# Patient Record
Sex: Male | Born: 1955 | Race: White | Hispanic: No | Marital: Married | Smoking: Never smoker
Health system: Southern US, Community
[De-identification: ages and names within clinical notes are randomized; demographics above are authoritative.]

## PROBLEM LIST (undated history)

## (undated) DIAGNOSIS — R7983 Abnormal findings of blood amino-acid level: Secondary | ICD-10-CM

## (undated) DIAGNOSIS — E538 Deficiency of other specified B group vitamins: Secondary | ICD-10-CM

## (undated) DIAGNOSIS — T7840XA Allergy, unspecified, initial encounter: Secondary | ICD-10-CM

## (undated) DIAGNOSIS — Z9989 Dependence on other enabling machines and devices: Secondary | ICD-10-CM

## (undated) DIAGNOSIS — K2270 Barrett's esophagus without dysplasia: Secondary | ICD-10-CM

## (undated) DIAGNOSIS — G4730 Sleep apnea, unspecified: Secondary | ICD-10-CM

## (undated) DIAGNOSIS — I10 Essential (primary) hypertension: Secondary | ICD-10-CM

## (undated) DIAGNOSIS — M7660 Achilles tendinitis, unspecified leg: Secondary | ICD-10-CM

## (undated) DIAGNOSIS — E119 Type 2 diabetes mellitus without complications: Secondary | ICD-10-CM

## (undated) DIAGNOSIS — R7401 Elevation of levels of liver transaminase levels: Secondary | ICD-10-CM

## (undated) DIAGNOSIS — K5730 Diverticulosis of large intestine without perforation or abscess without bleeding: Secondary | ICD-10-CM

## (undated) DIAGNOSIS — G4733 Obstructive sleep apnea (adult) (pediatric): Secondary | ICD-10-CM

## (undated) DIAGNOSIS — R7402 Elevation of levels of lactic acid dehydrogenase (LDH): Secondary | ICD-10-CM

## (undated) DIAGNOSIS — K449 Diaphragmatic hernia without obstruction or gangrene: Secondary | ICD-10-CM

## (undated) DIAGNOSIS — K760 Fatty (change of) liver, not elsewhere classified: Secondary | ICD-10-CM

## (undated) DIAGNOSIS — K219 Gastro-esophageal reflux disease without esophagitis: Secondary | ICD-10-CM

## (undated) DIAGNOSIS — M1990 Unspecified osteoarthritis, unspecified site: Secondary | ICD-10-CM

## (undated) DIAGNOSIS — R740 Nonspecific elevation of levels of transaminase and lactic acid dehydrogenase [LDH]: Secondary | ICD-10-CM

## (undated) DIAGNOSIS — E785 Hyperlipidemia, unspecified: Secondary | ICD-10-CM

## (undated) DIAGNOSIS — E7211 Homocystinuria: Secondary | ICD-10-CM

## (undated) HISTORY — DX: Type 2 diabetes mellitus without complications: E11.9

## (undated) HISTORY — DX: Essential (primary) hypertension: I10

## (undated) HISTORY — DX: Diaphragmatic hernia without obstruction or gangrene: K44.9

## (undated) HISTORY — PX: WISDOM TOOTH EXTRACTION: SHX21

## (undated) HISTORY — DX: Dependence on other enabling machines and devices: Z99.89

## (undated) HISTORY — DX: Gilbert syndrome: E80.4

## (undated) HISTORY — DX: Sleep apnea, unspecified: G47.30

## (undated) HISTORY — DX: Abnormal findings of blood amino-acid level: R79.83

## (undated) HISTORY — DX: Hyperlipidemia, unspecified: E78.5

## (undated) HISTORY — DX: Gastro-esophageal reflux disease without esophagitis: K21.9

## (undated) HISTORY — DX: Allergy, unspecified, initial encounter: T78.40XA

## (undated) HISTORY — DX: Elevation of levels of liver transaminase levels: R74.01

## (undated) HISTORY — DX: Barrett's esophagus without dysplasia: K22.70

## (undated) HISTORY — DX: Homocystinuria: E72.11

## (undated) HISTORY — DX: Unspecified osteoarthritis, unspecified site: M19.90

## (undated) HISTORY — PX: KNEE ARTHROSCOPY: SHX127

## (undated) HISTORY — PX: VASECTOMY: SHX75

## (undated) HISTORY — DX: Deficiency of other specified B group vitamins: E53.8

## (undated) HISTORY — DX: Elevation of levels of lactic acid dehydrogenase (LDH): R74.02

## (undated) HISTORY — DX: Fatty (change of) liver, not elsewhere classified: K76.0

## (undated) HISTORY — DX: Nonspecific elevation of levels of transaminase and lactic acid dehydrogenase (ldh): R74.0

## (undated) HISTORY — DX: Obstructive sleep apnea (adult) (pediatric): G47.33

## (undated) HISTORY — DX: Achilles tendinitis, unspecified leg: M76.60

## (undated) HISTORY — DX: Diverticulosis of large intestine without perforation or abscess without bleeding: K57.30

## (undated) HISTORY — PX: UPPER GASTROINTESTINAL ENDOSCOPY: SHX188

---

## 1999-09-07 ENCOUNTER — Encounter (INDEPENDENT_AMBULATORY_CARE_PROVIDER_SITE_OTHER): Payer: Self-pay | Admitting: Specialist

## 1999-09-07 ENCOUNTER — Other Ambulatory Visit: Admission: RE | Admit: 1999-09-07 | Discharge: 1999-09-07 | Payer: Self-pay | Admitting: Gastroenterology

## 2001-06-30 ENCOUNTER — Other Ambulatory Visit: Admission: RE | Admit: 2001-06-30 | Discharge: 2001-06-30 | Payer: Self-pay | Admitting: Gastroenterology

## 2001-06-30 ENCOUNTER — Encounter (INDEPENDENT_AMBULATORY_CARE_PROVIDER_SITE_OTHER): Payer: Self-pay | Admitting: Specialist

## 2004-04-27 ENCOUNTER — Encounter: Payer: Self-pay | Admitting: Internal Medicine

## 2004-10-23 ENCOUNTER — Ambulatory Visit: Payer: Self-pay | Admitting: Internal Medicine

## 2005-02-12 ENCOUNTER — Ambulatory Visit: Payer: Self-pay | Admitting: Internal Medicine

## 2005-06-26 ENCOUNTER — Ambulatory Visit: Payer: Self-pay | Admitting: Internal Medicine

## 2005-07-30 ENCOUNTER — Ambulatory Visit: Payer: Self-pay | Admitting: Gastroenterology

## 2005-08-06 ENCOUNTER — Ambulatory Visit: Payer: Self-pay | Admitting: Gastroenterology

## 2005-08-15 ENCOUNTER — Ambulatory Visit: Payer: Self-pay | Admitting: Internal Medicine

## 2005-08-21 ENCOUNTER — Ambulatory Visit: Payer: Self-pay | Admitting: Gastroenterology

## 2005-09-26 ENCOUNTER — Ambulatory Visit: Payer: Self-pay | Admitting: Gastroenterology

## 2005-10-31 ENCOUNTER — Ambulatory Visit: Payer: Self-pay | Admitting: Family Medicine

## 2005-11-01 ENCOUNTER — Ambulatory Visit: Payer: Self-pay | Admitting: Gastroenterology

## 2005-11-01 ENCOUNTER — Encounter (INDEPENDENT_AMBULATORY_CARE_PROVIDER_SITE_OTHER): Payer: Self-pay | Admitting: Specialist

## 2006-04-01 ENCOUNTER — Ambulatory Visit: Payer: Self-pay | Admitting: Internal Medicine

## 2006-07-08 ENCOUNTER — Encounter (INDEPENDENT_AMBULATORY_CARE_PROVIDER_SITE_OTHER): Payer: Self-pay | Admitting: *Deleted

## 2006-07-08 ENCOUNTER — Ambulatory Visit: Payer: Self-pay | Admitting: Internal Medicine

## 2006-07-08 LAB — CONVERTED CEMR LAB: PSA: 0.83 ng/mL

## 2006-08-06 ENCOUNTER — Ambulatory Visit: Payer: Self-pay | Admitting: Gastroenterology

## 2007-02-23 ENCOUNTER — Ambulatory Visit: Payer: Self-pay | Admitting: Internal Medicine

## 2007-08-06 ENCOUNTER — Ambulatory Visit: Payer: Self-pay | Admitting: Gastroenterology

## 2007-09-15 DIAGNOSIS — K449 Diaphragmatic hernia without obstruction or gangrene: Secondary | ICD-10-CM | POA: Insufficient documentation

## 2007-09-15 DIAGNOSIS — E7219 Other disorders of sulfur-bearing amino-acid metabolism: Secondary | ICD-10-CM

## 2007-09-15 DIAGNOSIS — K2270 Barrett's esophagus without dysplasia: Secondary | ICD-10-CM | POA: Insufficient documentation

## 2007-09-15 DIAGNOSIS — D179 Benign lipomatous neoplasm, unspecified: Secondary | ICD-10-CM | POA: Insufficient documentation

## 2007-09-15 DIAGNOSIS — R7983 Abnormal findings of blood amino-acid level: Secondary | ICD-10-CM | POA: Insufficient documentation

## 2007-09-15 DIAGNOSIS — K219 Gastro-esophageal reflux disease without esophagitis: Secondary | ICD-10-CM | POA: Insufficient documentation

## 2007-09-17 ENCOUNTER — Ambulatory Visit: Payer: Self-pay | Admitting: Internal Medicine

## 2007-09-17 DIAGNOSIS — I10 Essential (primary) hypertension: Secondary | ICD-10-CM | POA: Insufficient documentation

## 2007-09-21 ENCOUNTER — Encounter (INDEPENDENT_AMBULATORY_CARE_PROVIDER_SITE_OTHER): Payer: Self-pay | Admitting: *Deleted

## 2007-10-02 ENCOUNTER — Ambulatory Visit: Payer: Self-pay | Admitting: Internal Medicine

## 2007-10-05 ENCOUNTER — Encounter (INDEPENDENT_AMBULATORY_CARE_PROVIDER_SITE_OTHER): Payer: Self-pay | Admitting: *Deleted

## 2007-10-06 ENCOUNTER — Telehealth (INDEPENDENT_AMBULATORY_CARE_PROVIDER_SITE_OTHER): Payer: Self-pay | Admitting: *Deleted

## 2008-01-18 ENCOUNTER — Telehealth (INDEPENDENT_AMBULATORY_CARE_PROVIDER_SITE_OTHER): Payer: Self-pay | Admitting: *Deleted

## 2008-02-08 ENCOUNTER — Telehealth (INDEPENDENT_AMBULATORY_CARE_PROVIDER_SITE_OTHER): Payer: Self-pay | Admitting: *Deleted

## 2008-07-19 ENCOUNTER — Telehealth (INDEPENDENT_AMBULATORY_CARE_PROVIDER_SITE_OTHER): Payer: Self-pay | Admitting: *Deleted

## 2008-08-16 ENCOUNTER — Ambulatory Visit: Payer: Self-pay | Admitting: Gastroenterology

## 2008-10-28 DIAGNOSIS — K5730 Diverticulosis of large intestine without perforation or abscess without bleeding: Secondary | ICD-10-CM

## 2008-10-28 HISTORY — DX: Diverticulosis of large intestine without perforation or abscess without bleeding: K57.30

## 2008-10-28 HISTORY — PX: COLONOSCOPY: SHX174

## 2008-11-16 ENCOUNTER — Ambulatory Visit: Payer: Self-pay | Admitting: Internal Medicine

## 2008-11-16 DIAGNOSIS — J309 Allergic rhinitis, unspecified: Secondary | ICD-10-CM | POA: Insufficient documentation

## 2008-11-16 DIAGNOSIS — R351 Nocturia: Secondary | ICD-10-CM

## 2008-11-16 DIAGNOSIS — N401 Enlarged prostate with lower urinary tract symptoms: Secondary | ICD-10-CM | POA: Insufficient documentation

## 2008-11-16 DIAGNOSIS — M7660 Achilles tendinitis, unspecified leg: Secondary | ICD-10-CM | POA: Insufficient documentation

## 2008-11-23 ENCOUNTER — Telehealth (INDEPENDENT_AMBULATORY_CARE_PROVIDER_SITE_OTHER): Payer: Self-pay | Admitting: *Deleted

## 2008-11-24 ENCOUNTER — Encounter (INDEPENDENT_AMBULATORY_CARE_PROVIDER_SITE_OTHER): Payer: Self-pay | Admitting: *Deleted

## 2009-01-06 ENCOUNTER — Telehealth: Payer: Self-pay | Admitting: Gastroenterology

## 2009-01-24 ENCOUNTER — Telehealth (INDEPENDENT_AMBULATORY_CARE_PROVIDER_SITE_OTHER): Payer: Self-pay | Admitting: *Deleted

## 2009-01-24 ENCOUNTER — Ambulatory Visit: Payer: Self-pay | Admitting: Gastroenterology

## 2009-02-02 ENCOUNTER — Encounter: Payer: Self-pay | Admitting: Gastroenterology

## 2009-02-10 ENCOUNTER — Ambulatory Visit: Payer: Self-pay | Admitting: Gastroenterology

## 2009-02-10 ENCOUNTER — Encounter: Payer: Self-pay | Admitting: Gastroenterology

## 2009-02-10 LAB — HM COLONOSCOPY

## 2009-02-19 ENCOUNTER — Encounter: Payer: Self-pay | Admitting: Gastroenterology

## 2009-10-03 ENCOUNTER — Ambulatory Visit: Payer: Self-pay | Admitting: Gastroenterology

## 2009-10-05 ENCOUNTER — Telehealth: Payer: Self-pay | Admitting: Gastroenterology

## 2009-10-05 LAB — CONVERTED CEMR LAB
Ferritin: 130.6 ng/mL (ref 22.0–322.0)
Folate: 10.8 ng/mL
Iron: 67 ug/dL (ref 42–165)
Saturation Ratios: 19.8 % — ABNORMAL LOW (ref 20.0–50.0)
Transferrin: 241.2 mg/dL (ref 212.0–360.0)
Vitamin B-12: 213 pg/mL (ref 211–911)

## 2009-10-09 ENCOUNTER — Telehealth (INDEPENDENT_AMBULATORY_CARE_PROVIDER_SITE_OTHER): Payer: Self-pay | Admitting: *Deleted

## 2009-10-11 ENCOUNTER — Ambulatory Visit: Payer: Self-pay | Admitting: Gastroenterology

## 2009-10-18 ENCOUNTER — Ambulatory Visit: Payer: Self-pay | Admitting: Gastroenterology

## 2009-10-18 DIAGNOSIS — E538 Deficiency of other specified B group vitamins: Secondary | ICD-10-CM | POA: Insufficient documentation

## 2009-10-24 ENCOUNTER — Ambulatory Visit: Payer: Self-pay | Admitting: Internal Medicine

## 2009-10-25 ENCOUNTER — Ambulatory Visit: Payer: Self-pay | Admitting: Gastroenterology

## 2009-11-15 ENCOUNTER — Ambulatory Visit: Payer: Self-pay | Admitting: Internal Medicine

## 2009-11-15 LAB — CONVERTED CEMR LAB
Bilirubin Urine: NEGATIVE
Blood in Urine, dipstick: NEGATIVE
Glucose, Urine, Semiquant: NEGATIVE
Ketones, urine, test strip: NEGATIVE
Nitrite: NEGATIVE
Protein, U semiquant: NEGATIVE
Specific Gravity, Urine: <1.005
Urobilinogen, UA: 0.2
WBC Urine, dipstick: NEGATIVE
pH: 6.5

## 2009-11-16 LAB — CONVERTED CEMR LAB
ALT: 68 units/L — ABNORMAL HIGH (ref 0–53)
AST: 40 units/L — ABNORMAL HIGH (ref 0–37)
Albumin: 4.3 g/dL (ref 3.5–5.2)
Alkaline Phosphatase: 48 units/L (ref 39–117)
BUN: 13 mg/dL (ref 6–23)
Basophils Absolute: 0 10*3/uL (ref 0.0–0.1)
Basophils Relative: 1.2 % (ref 0.0–3.0)
Bilirubin, Direct: 0.1 mg/dL (ref 0.0–0.3)
CO2: 30 meq/L (ref 19–32)
Calcium: 9 mg/dL (ref 8.4–10.5)
Chloride: 106 meq/L (ref 96–112)
Cholesterol: 204 mg/dL — ABNORMAL HIGH (ref 0–200)
Creatinine, Ser: 1 mg/dL (ref 0.4–1.5)
Direct LDL: 162 mg/dL
Eosinophils Absolute: 0.1 10*3/uL (ref 0.0–0.7)
Eosinophils Relative: 2.4 % (ref 0.0–5.0)
GFR calc non Af Amer: 83.24 mL/min (ref >60)
Glucose, Bld: 92 mg/dL (ref 70–99)
HCT: 48.3 % (ref 39.0–52.0)
HDL: 38.6 mg/dL — ABNORMAL LOW (ref >39.00)
Hemoglobin: 16 g/dL (ref 13.0–17.0)
Lymphocytes Relative: 33 % (ref 12.0–46.0)
Lymphs Abs: 1.3 10*3/uL (ref 0.7–4.0)
MCHC: 33.1 g/dL (ref 30.0–36.0)
MCV: 96.9 fL (ref 78.0–100.0)
Monocytes Absolute: 0.4 10*3/uL (ref 0.1–1.0)
Monocytes Relative: 9.8 % (ref 3.0–12.0)
Neutro Abs: 2.1 10*3/uL (ref 1.4–7.7)
Neutrophils Relative %: 53.6 % (ref 43.0–77.0)
PSA: 0.56 ng/mL (ref 0.10–4.00)
Platelets: 172 10*3/uL (ref 150.0–400.0)
Potassium: 3.7 meq/L (ref 3.5–5.1)
RBC: 4.98 M/uL (ref 4.22–5.81)
RDW: 11.8 % (ref 11.5–14.6)
Sodium: 141 meq/L (ref 135–145)
TSH: 2.14 microintl units/mL (ref 0.35–5.50)
Total Bilirubin: 1.5 mg/dL — ABNORMAL HIGH (ref 0.3–1.2)
Total CHOL/HDL Ratio: 5
Total Protein: 7.4 g/dL (ref 6.0–8.3)
Triglycerides: 96 mg/dL (ref 0.0–149.0)
VLDL: 19.2 mg/dL (ref 0.0–40.0)
WBC: 3.9 10*3/uL — ABNORMAL LOW (ref 4.5–10.5)

## 2009-11-22 ENCOUNTER — Ambulatory Visit: Payer: Self-pay | Admitting: Gastroenterology

## 2009-11-23 ENCOUNTER — Ambulatory Visit: Payer: Self-pay | Admitting: Internal Medicine

## 2009-11-23 DIAGNOSIS — R7401 Elevation of levels of liver transaminase levels: Secondary | ICD-10-CM | POA: Insufficient documentation

## 2009-11-23 DIAGNOSIS — R740 Nonspecific elevation of levels of transaminase and lactic acid dehydrogenase [LDH]: Secondary | ICD-10-CM

## 2009-11-23 DIAGNOSIS — E785 Hyperlipidemia, unspecified: Secondary | ICD-10-CM | POA: Insufficient documentation

## 2009-12-20 ENCOUNTER — Ambulatory Visit: Payer: Self-pay | Admitting: Gastroenterology

## 2010-01-17 ENCOUNTER — Ambulatory Visit: Payer: Self-pay | Admitting: Internal Medicine

## 2010-01-17 ENCOUNTER — Ambulatory Visit: Payer: Self-pay | Admitting: Gastroenterology

## 2010-01-19 ENCOUNTER — Telehealth: Payer: Self-pay | Admitting: Internal Medicine

## 2010-02-21 ENCOUNTER — Ambulatory Visit: Payer: Self-pay | Admitting: Gastroenterology

## 2010-03-05 ENCOUNTER — Encounter: Payer: Self-pay | Admitting: Gastroenterology

## 2010-03-05 ENCOUNTER — Telehealth: Payer: Self-pay | Admitting: Gastroenterology

## 2010-03-21 ENCOUNTER — Ambulatory Visit: Payer: Self-pay | Admitting: Gastroenterology

## 2010-04-18 ENCOUNTER — Ambulatory Visit: Payer: Self-pay | Admitting: Gastroenterology

## 2010-05-16 ENCOUNTER — Ambulatory Visit: Payer: Self-pay | Admitting: Gastroenterology

## 2010-06-20 ENCOUNTER — Ambulatory Visit: Payer: Self-pay | Admitting: Gastroenterology

## 2010-07-18 ENCOUNTER — Ambulatory Visit: Payer: Self-pay | Admitting: Gastroenterology

## 2010-08-15 ENCOUNTER — Ambulatory Visit: Payer: Self-pay | Admitting: Gastroenterology

## 2010-09-12 ENCOUNTER — Ambulatory Visit: Payer: Self-pay | Admitting: Gastroenterology

## 2010-10-04 ENCOUNTER — Ambulatory Visit: Payer: Self-pay | Admitting: Gastroenterology

## 2010-10-17 ENCOUNTER — Ambulatory Visit: Payer: Self-pay | Admitting: Gastroenterology

## 2010-11-06 ENCOUNTER — Ambulatory Visit
Admission: RE | Admit: 2010-11-06 | Discharge: 2010-11-06 | Payer: Self-pay | Source: Home / Self Care | Attending: Internal Medicine | Admitting: Internal Medicine

## 2010-11-09 ENCOUNTER — Telehealth: Payer: Self-pay | Admitting: Internal Medicine

## 2010-11-14 ENCOUNTER — Ambulatory Visit
Admission: RE | Admit: 2010-11-14 | Discharge: 2010-11-14 | Payer: Self-pay | Source: Home / Self Care | Attending: Gastroenterology | Admitting: Gastroenterology

## 2010-11-25 LAB — CONVERTED CEMR LAB
ALT: 48 units/L (ref 0–53)
ALT: 65 units/L — ABNORMAL HIGH (ref 0–53)
AST: 34 units/L (ref 0–37)
AST: 41 units/L — ABNORMAL HIGH (ref 0–37)
Albumin: 4.3 g/dL (ref 3.5–5.2)
Albumin: 4.5 g/dL (ref 3.5–5.2)
Alkaline Phosphatase: 44 units/L (ref 39–117)
Alkaline Phosphatase: 53 units/L (ref 39–117)
BUN: 11 mg/dL (ref 6–23)
BUN: 15 mg/dL (ref 6–23)
Basophils Absolute: 0 10*3/uL (ref 0.0–0.1)
Basophils Absolute: 0.1 10*3/uL (ref 0.0–0.1)
Basophils Relative: 0.9 % (ref 0.0–1.0)
Basophils Relative: 1.5 % (ref 0.0–3.0)
Bilirubin, Direct: 0.1 mg/dL (ref 0.0–0.3)
Bilirubin, Direct: 0.1 mg/dL (ref 0.0–0.3)
CO2: 31 meq/L (ref 19–32)
CO2: 31 meq/L (ref 19–32)
Calcium: 9 mg/dL (ref 8.4–10.5)
Calcium: 9.1 mg/dL (ref 8.4–10.5)
Chloride: 103 meq/L (ref 96–112)
Chloride: 106 meq/L (ref 96–112)
Cholesterol, target level: 200 mg/dL
Cholesterol: 190 mg/dL (ref 0–200)
Cholesterol: 210 mg/dL (ref 0–200)
Creatinine, Ser: 0.9 mg/dL (ref 0.4–1.5)
Creatinine, Ser: 0.9 mg/dL (ref 0.4–1.5)
Direct LDL: 163 mg/dL
Eosinophils Absolute: 0.1 10*3/uL (ref 0.0–0.6)
Eosinophils Absolute: 0.1 10*3/uL (ref 0.0–0.7)
Eosinophils Relative: 1.9 % (ref 0.0–5.0)
Eosinophils Relative: 2.4 % (ref 0.0–5.0)
GFR calc Af Amer: 114 mL/min
GFR calc Af Amer: 115 mL/min
GFR calc non Af Amer: 95 mL/min
GFR calc non Af Amer: 95 mL/min
Glucose, Bld: 101 mg/dL — ABNORMAL HIGH (ref 70–99)
Glucose, Bld: 98 mg/dL (ref 70–99)
HCT: 48.5 % (ref 39.0–52.0)
HCT: 48.5 % (ref 39.0–52.0)
HDL goal, serum: 40 mg/dL
HDL: 39.6 mg/dL (ref >39.0)
HDL: 39.9 mg/dL (ref >39.0)
Hemoglobin: 16.9 g/dL (ref 13.0–17.0)
Hemoglobin: 17.2 g/dL — ABNORMAL HIGH (ref 13.0–17.0)
LDL Cholesterol: 133 mg/dL — ABNORMAL HIGH (ref 0–99)
LDL Goal: 110 mg/dL
Lymphocytes Relative: 25.8 % (ref 12.0–46.0)
Lymphocytes Relative: 26 % (ref 12.0–46.0)
MCHC: 34.8 g/dL (ref 30.0–36.0)
MCHC: 35.5 g/dL (ref 30.0–36.0)
MCV: 92.5 fL (ref 78.0–100.0)
MCV: 95.6 fL (ref 78.0–100.0)
Monocytes Absolute: 0.4 10*3/uL (ref 0.1–1.0)
Monocytes Absolute: 0.4 10*3/uL (ref 0.2–0.7)
Monocytes Relative: 8.1 % (ref 3.0–11.0)
Monocytes Relative: 9 % (ref 3.0–12.0)
Neutro Abs: 2.4 10*3/uL (ref 1.4–7.7)
Neutro Abs: 3.3 10*3/uL (ref 1.4–7.7)
Neutrophils Relative %: 61.6 % (ref 43.0–77.0)
Neutrophils Relative %: 62.8 % (ref 43.0–77.0)
PSA: 0.69 ng/mL (ref 0.10–4.00)
PSA: 1.16 ng/mL (ref 0.10–4.00)
Platelets: 180 10*3/uL (ref 150–400)
Platelets: 207 10*3/uL (ref 150–400)
Potassium: 3.6 meq/L (ref 3.5–5.1)
Potassium: 3.6 meq/L (ref 3.5–5.1)
RBC: 5.07 M/uL (ref 4.22–5.81)
RBC: 5.24 M/uL (ref 4.22–5.81)
RDW: 11.9 % (ref 11.5–14.6)
RDW: 12 % (ref 11.5–14.6)
Sodium: 140 meq/L (ref 135–145)
Sodium: 140 meq/L (ref 135–145)
TSH: 1.35 microintl units/mL (ref 0.35–5.50)
TSH: 1.53 microintl units/mL (ref 0.35–5.50)
Total Bilirubin: 1.4 mg/dL — ABNORMAL HIGH (ref 0.3–1.2)
Total Bilirubin: 1.5 mg/dL — ABNORMAL HIGH (ref 0.3–1.2)
Total CHOL/HDL Ratio: 4.8
Total CHOL/HDL Ratio: 5.3
Total Protein: 7.3 g/dL (ref 6.0–8.3)
Total Protein: 7.4 g/dL (ref 6.0–8.3)
Triglycerides: 84 mg/dL (ref 0–149)
Triglycerides: 98 mg/dL (ref 0–149)
VLDL: 17 mg/dL (ref 0–40)
VLDL: 20 mg/dL (ref 0–40)
WBC: 4 10*3/uL — ABNORMAL LOW (ref 4.5–10.5)
WBC: 5.1 10*3/uL (ref 4.5–10.5)

## 2010-11-27 NOTE — Assessment & Plan Note (Signed)
Summary: annual/no concerns per pt...as.    History of Present Illness Visit Type: Follow-up Visit Primary GI MD: Sheryn Bison MD Faith Rogue Primary Provider: Oren Bracket Requesting Provider: n/a Chief Complaint: yearly f/u with no complaints. Pt does not need refills.  History of Present Illness:   This patient this 8 year white male with chronic gastroesophageal reflux disease and known Barrett's mucosa and with endoscopy and dysplasia biopsies this past spring. There was no evidence of dysplasia biopsies, is currently asymptomatic on Nexium 40 mg a day. Previous evaluation for abnormal liver function tests was entirely normal. He currently denies dysphagia, chest pain, or hepatobiliary or lower gastrointestinal problems. He is up-to-date on his colonoscopy exams. His mild essential hypertension and hyperlipidemia.   GI Review of Systems      Denies abdominal pain, acid reflux, belching, bloating, chest pain, dysphagia with liquids, dysphagia with solids, heartburn, loss of appetite, nausea, vomiting, vomiting blood, weight loss, and  weight gain.        Denies anal fissure, black tarry stools, change in bowel habit, constipation, diarrhea, diverticulosis, fecal incontinence, heme positive stool, hemorrhoids, irritable bowel syndrome, jaundice, light color stool, liver problems, rectal bleeding, and  rectal pain.    Current Medications (verified): 1)  Allegra 180 Mg Tabs (Fexofenadine Hcl) .... Take One Tablet Daily As Needed 2)  Nexium 40 Mg Cpdr (Esomeprazole Magnesium) .... Take 1 Capsule By Mouth Once A Day 3)  Cartia Xt 240 Mg  Cp24 (Diltiazem Hcl Coated Beads) .Marland Kitchen.. 1 By Mouth Qd 4)  Hydrochlorothiazide 12.5 Mg  Caps (Hydrochlorothiazide) .Marland Kitchen.. 1 Qd  Allergies (verified): No Known Drug Allergies  Past History:  Past medical, surgical, family and social histories (including risk factors) reviewed for relevance to current acute and chronic problems.  Past Medical  History: Reviewed history from 11/16/2008 and no changes required. Hypertension GERD/ Barrett's Allergic rhinitis Hyperlipidemia  Past Surgical History: Reviewed history from 11/16/2008 and no changes required. Vasectomy EGD  (done periodically since 1990):hiatal hernia, Barrett's, Dr Jarold Motto  Family History: Reviewed history from 11/16/2008 and no changes required. Father: Kidney CA (died at 26), stomach and throat CA, HTN, bypass surgery @ 45, smoker Mother: HTN Siblings: sister-DM No FH of Colon Cancer:  Social History: Reviewed history from 11/16/2008 and no changes required. Occupation: dry cleaning business Married Never Smoked Alcohol use-no Drug use-no Regular exercise-yes  Review of Systems  The patient denies allergy/sinus, anemia, anxiety-new, arthritis/joint pain, back pain, blood in urine, breast changes/lumps, change in vision, confusion, cough, coughing up blood, depression-new, fainting, fatigue, fever, headaches-new, hearing problems, heart murmur, heart rhythm changes, itching, menstrual pain, muscle pains/cramps, night sweats, nosebleeds, pregnancy symptoms, shortness of breath, skin rash, sleeping problems, sore throat, swelling of feet/legs, swollen lymph glands, thirst - excessive , urination - excessive , urination changes/pain, urine leakage, vision changes, and voice change.    Vital Signs:  Patient profile:   55 year old male Height:      69.25 inches Weight:      200 pounds BMI:     29.43 Pulse rate:   80 / minute Pulse rhythm:   regular BP sitting:   172 / 104  (left arm) Cuff size:   regular  Vitals Entered By: Christie Nottingham CMA Duncan Dull) (October 03, 2009 2:36 PM)  Physical Exam  General:  Well developed, well nourished, no acute distress.healthy appearing.   Head:  Normocephalic and atraumatic. Eyes:  PERRLA, no icterus.exam deferred to patient's ophthalmologist.   Abdomen:  Soft, nontender  and nondistended. No masses,  hepatosplenomegaly or hernias noted. Normal bowel sounds. Neurologic:  Alert and  oriented x4;  grossly normal neurologically. Psych:  Alert and cooperative. Normal mood and affect.   Impression & Recommendations:  Problem # 1:  GERD (ICD-530.81) Assessment Improved Continue antireflux regime and daily Nexium therapy. Anemia profile ordered because of chronic PPI use.  Problem # 2:  BARRETT'S ESOPHAGUS, HX OF (ICD-V12.79) Assessment: Unchanged Endoscopy with dysplasia screening every 3 years. Orders: TLB-B12, Serum-Total ONLY ([address]-U04) TLB-Ferritin (82728-FER) TLB-Folic Acid (Folate) (82746-FOL) TLB-IBC Pnl (Iron/FE;Transferrin) (83550-IBC)  Problem # 3:  HYPERTENSION, ESSENTIAL NOS (ICD-401.9) Assessment: Deteriorated blood pressure today is 172/104 and he's been asked to continue his blood pressure medication and I'll send this note to Dr. Alwyn Ren for review. He may need more aggressive antihypertensive therapy.  Patient Instructions: 1)  Copy sent to : Dr. Marga Melnick 2)  Please continue current medications.  3)  Barrett's Esophagus brochure given.  4)  Avoid foods high in acid content ( tomatoes, citrus juices, spicy foods) . Avoid eating within 3 to 4 hours of lying down or before exercising. Do not over eat; try smaller more frequent meals. Elevate head of bed four inches when sleeping.   Appended Document: annual/no concerns per pt...as. please send copy of this report to Dr. Marga Melnick and notify his office of the patient's blood pressure which is 172/104 and probably will need repeat blood pressure medication adjustment with Dr. Alwyn Ren.  Appended Document: annual/no concerns per pt...as. Note sent to Dr. Alwyn Ren.  Appended Document: annual/no concerns per pt...as. he needs to make appt ; bring all meds & BP readings

## 2010-11-27 NOTE — Procedures (Signed)
Summary: Gastroenterology EGD  Gastroenterology EGD   Imported By: Marlon Pel 01/06/2008 09:43:57  _____________________________________________________________________  External Attachment:    Type:   Image     Comment:   External Document

## 2010-11-27 NOTE — Progress Notes (Signed)
Summary: HOP--COUGH MED  Phone Note Call from Patient Call back at Work Phone [phone] Call back at [phone]   Caller: Patient Summary of Call: PT WAS SEEN LAST FRI FOR A COLD WAS GIVEN AND Z-PACK WHICH IS WORKING NOW HE NEED SOMETHING TO SUBPRESS HIS COUGH TO BE CALL INTO THE [facility] [address]. BUT YOU CAN CALL HIM BACK ON HIS [facility] PHONE WHICH IS [phone] Initial call taken by: Freddy Jaksch,  October 06, 2007 9:21 AM  Follow-up for Phone Call        OK Follow-up by: Marga Melnick MD,  October 06, 2007 1:15 PM  Additional Follow-up for Phone Call Additional follow up Details #1::        [facility] [address] pt informed...................................................................Marland KitchenKandice Hams  October 06, 2007 2:58 PM  Additional Follow-up by: Kandice Hams,  October 06, 2007 2:58 PM    New/Updated Medications: PROMETHAZINE-CODEINE 6.25-10 MG/5ML  SYRP (PROMETHAZINE-CODEINE) 1-2 tsp q 6-8 hrs prn   Prescriptions: PROMETHAZINE-CODEINE 6.25-10 MG/5ML  SYRP (PROMETHAZINE-CODEINE) 1-2 tsp q 6-8 hrs prn  #240cc x 0   Entered by:   Kandice Hams   Authorized by:   Marga Melnick MD   Signed by:   Kandice Hams on 10/06/2007   Method used:   Electronically sent to ...       [facility]  [address] [id number]*       [address]       [place], [place]  [address]       Ph: [phone] or [phone]       Fax: [phone]   RxID:   [id number]     Appended Document: HOP--COUGH MED I called this into the pharmacy as it was kicked back to me  on eprescribe.  I called the patient to let him know I called it in ........................................................kathy bixby,cma

## 2010-11-27 NOTE — Medication Information (Signed)
Summary: Nexium Approved/[employer]  Nexium Approved/[employer]   Imported By: Sherian Rein 02/17/2009 10:52:12  _____________________________________________________________________  External Attachment:    Type:   Image     Comment:   External Document

## 2010-11-27 NOTE — Assessment & Plan Note (Signed)
Summary: 3rd of 3 B12,266.2/dfs  Nurse Visit   Allergies: No Known Drug Allergies  Medication Administration  Injection # 1:    Medication: Vit B12 1000 mcg    Diagnosis: B12 DEFICIENCY (ICD-266.2)    Route: IM    Site: L deltoid    Exp Date: 07/29/2011    Lot #: [id number]    Mfr: [place]    Patient tolerated injection without complications    Given by: Bernita Buffy CMA ([place]) (October 25, 2009 1:33 PM)

## 2010-11-27 NOTE — Assessment & Plan Note (Signed)
Summary: 2nd of 3 B12, 266.2/dfs  Nurse Visit   Allergies: No Known Drug Allergies  Medication Administration  Injection # 1:    Medication: Vit B12 1000 mcg    Diagnosis: B12 DEFICIENCY (ICD-266.2)    Route: IM    Site: R deltoid    Exp Date: 05/2011    Lot #: [id number]    Mfr: American Regent    Comments: pt already scheduled for b12 next week    Patient tolerated injection without complications    Given by: Christian Mate CMA Deborra Medina) (October 18, 2009 12:14 PM)  Orders Added: 1)  Vit B12 1000 mcg [M4680]

## 2010-11-27 NOTE — Assessment & Plan Note (Signed)
Summary: ear infection?/kdc   Vital Signs:  Patient profile:   55 year old male Weight:      202.8 pounds Temp:     98.4 degrees F oral Pulse rate:   72 / minute Resp:     14 per minute BP sitting:   118 / 80  (left arm) Cuff size:   large  Vitals Entered By: Shonna Chock (January 17, 2010 1:13 PM) CC: Left Ear Infection Comments REVIEWED MED LIST, PATIENT AGREED DOSE AND INSTRUCTION CORRECT    Primary Care Provider:  Chrissie Noa Tameko Halder,MD  CC:  Left Ear Infection.  History of Present Illness: Onset as L earache 01/14/2010 with progression , "like a sore tooth" by 03/21.Minor symptoms in R ear. Rx: Tylenolwith some benefit. No PMH of otitis . No flying or barotrauma; he used Q tip 03/20.  Allergies (verified): No Known Drug Allergies  Review of Systems General:  Denies chills, fever, and sweats. ENT:  Complains of earache; denies decreased hearing, ear discharge, nasal congestion, postnasal drainage, ringing in ears, sinus pressure, and sore throat; No facial pain , frontal headache or purulence. Resp:  Denies cough and sputum productive. Allergy:  Denies itching eyes and sneezing.  Physical Exam  General:  in no acute distress; alert,appropriate and cooperative throughout examination Ears:  External ear exam shows no significant lesions or deformities.  Otoscopic examination reveals clear canals, tympanic membranes are intact bilaterally without bulging, retraction.Minimal  inflammation; no  discharge. Hearing is grossly normal bilaterally.No mastoid tenderness Nose:  External nasal examination shows no deformity or inflammation. Nasal mucosa are pink and moist without lesions or exudates. Mouth:  Oral mucosa and oropharynx without lesions or exudates.  Teeth in good repair. No TMJ signs Neck:  No deformities, masses, or tenderness noted.No thyroid tenderness Skin:  Intact without suspicious lesions or rashes Cervical Nodes:  No lymphadenopathy noted Axillary Nodes:  No palpable  lymphadenopathy   Impression & Recommendations:  Problem # 1:  EAR PAIN, BILATERAL (ICD-388.70)  L > R; minimal erythema  His updated medication list for this problem includes:    Neomycin-polymyxin-hc 3.5-10000-1 Susp (Neomycin-polymyxin-hc) .Marland KitchenMarland KitchenMarland KitchenMarland Kitchen 3 drops qid into affected ear  Complete Medication List: 1)  Allegra 180 Mg Tabs (Fexofenadine hcl) .... Take one tablet daily as needed 2)  Nexium 40 Mg Cpdr (Esomeprazole magnesium) .... Take 1 capsule by mouth once a day 3)  Cartia Xt 240 Mg Cp24 (Diltiazem hcl coated beads) .Marland Kitchen.. 1 by mouth qd 4)  Hydrochlorothiazide 12.5 Mg Caps (Hydrochlorothiazide) .Marland Kitchen.. 1 qd 5)  Cyanocobalamin 1000 Mcg/ml Inj Soln (Cyanocobalamin) .Marland Kitchen.. 1 cc im weelky x 3 weeks the 1 inj monthly 6)  Losartan Potassium 100 Mg Tabs (Losartan potassium) .... 1/2 once daily 7)  Neomycin-polymyxin-hc 3.5-10000-1 Susp (Neomycin-polymyxin-hc) .... 3 drops qid into affected ear 8)  Tramadol Hcl 50 Mg Tabs (Tramadol hcl) .Marland Kitchen.. 1 q 6 hrs as needed for pain  Patient Instructions: 1)  Report increasing , pus or fever as discussed. Prescriptions: TRAMADOL HCL 50 MG TABS (TRAMADOL HCL) 1 q 6 hrs as needed for pain  #30 x 0   Entered and Authorized by:   Marga Melnick MD   Signed by:   Marga Melnick MD on 01/17/2010   Method used:   Faxed to ...       [facility]  [address] [id number]* (retail)       [address]       [facility] [place], [place]  [address]       Ph: [phone] or [phone]       Fax: [phone]   RxID:   [id number] NEOMYCIN-POLYMYXIN-HC 3.5-10000-1 SUSP (NEOMYCIN-POLYMYXIN-HC) 3 drops qid into affected ear  #10cc x 0   Entered and Authorized by:   Marga Melnick MD   Signed by:   Marga Melnick MD on 01/17/2010   Method used:   Faxed to ...       [facility]  [address] [id number]* (retail)       [address]       [place], [place]  [address]       Ph: [phone] or [phone]       Fax: [phone]   RxID:    [id number]

## 2010-11-27 NOTE — Assessment & Plan Note (Signed)
Summary: MONTHLY B12 SHOT/JMS  Nurse Visit   Allergies: No Known Drug Allergies  Medication Administration  Injection # 1:    Medication: Vit B12 1000 mcg    Diagnosis: B12 DEFICIENCY (ICD-266.2)    Route: IM    Site: R deltoid    Exp Date: 04/2012    Lot #: [id number]    Mfr: American Regent    Patient tolerated injection without complications    Given by: Christian Mate CMA Deborra Medina) (August 15, 2010 11:44 AM)  Orders Added: 1)  Vit B12 1000 mcg [P4961]

## 2010-11-27 NOTE — Assessment & Plan Note (Signed)
Summary: ACUTE:BAD COLD//ALJ   Vital Signs:  Patient Profile:   55 Years Old Male Weight:      198.50 pounds Temp:     99.7 degrees F oral Pulse rate:   76 / minute Pulse rhythm:   regular BP sitting:   122 / 78  (left arm) Cuff size:   large  Pt. in pain?   no  Vitals Entered By: Wendall Stade (October 02, 2007 5:08 PM)                  Chief Complaint:  sick for one and one half weeks and URI symptoms.  History of Present Illness: sick for one and one half weeks, using over the counter medications, nasal congestion, thickle in throat causing cough, ears hurt, throat sore, glands swollen, feels bad  URI Symptoms; Rx: Tylenol, Coricidin      This is a 55 year old man who presents with URI symptoms.  The patient reports sore throat, productive cough, and earache, but denies nasal congestion, clear nasal discharge, purulent nasal discharge, dry cough, and sick contacts.  Associated symptoms include fever, low-grade fever (<100.5 degrees), use of an antipyretic, and response to antipyretic.  The patient denies stiff neck, dyspnea, wheezing, rash, vomiting, and diarrhea.  The patient also reports severe fatigue.  The patient denies itchy watery eyes, itchy throat, sneezing, seasonal symptoms, response to antihistamine, headache, and muscle aches.  Risk factors for Strep sinusitis include double sickening.  The patient denies the following risk factors for Strep sinusitis: unilateral facial pain, unilateral nasal discharge, poor response to decongestant, tooth pain, Strep exposure, and tender adenopathy.    Current Allergies (reviewed today): ! ACE INHIBITORS      Physical Exam  General:     Well-developed,well-nourished,in no acute distress; alert,appropriate and cooperative throughout examination;uncomfortable-appearing.   Eyes:     No corneal or conjunctival inflammation noted. EOMI. Perrla.. Vision grossly normal. Ears:     Slight erythema Nose:     Slightly boggy;  hyponasal speech Mouth:     Marked erythema of uvula Lungs:     Normal respiratory effort, chest expands symmetrically. Lungs are clear to auscultation, no crackles or wheezes. Cervical Nodes:     No lymphadenopathy noted Axillary Nodes:     No palpable lymphadenopathy    Impression & Recommendations:  Problem # 1:  URI (ICD-465.9)  His updated medication list for this problem includes:    Allegra 180 Mg Tabs (Fexofenadine hcl) .Marland Kitchen... Take one tablet daily as needed   Problem # 2:  PHARYNGITIS-ACUTE (ICD-462)  His updated medication list for this problem includes:    Azithromycin 250 Mg Tabs (Azithromycin) .Marland Kitchen... Aas per package   Complete Medication List: 1)  Allegra 180 Mg Tabs (Fexofenadine hcl) .... Take one tablet daily as needed 2)  Nexium 40 Mg Cpdr (Esomeprazole magnesium) .... Take 1 capsule by mouth once a day 3)  Cartia Xt 240 Mg Cp24 (Diltiazem hcl coated beads) .Marland Kitchen.. 1 by mouth qd 4)  Hydrochlorothiazide 12.5 Mg Caps (Hydrochlorothiazide) .Marland Kitchen.. 1 qd 5)  Crestor 20 Mg Tabs (Rosuvastatin calcium) .... As directedhold 6)  Azithromycin 250 Mg Tabs (Azithromycin) .... Aas per package   Patient Instructions: 1)  Drink as much fluid as you can tolerate for the next few days.  Report facial pain ,pus,  increasing fever. Zinc lozenges as needed sore throat. 2)  Take 650-1000mg  of Tylenol every 4-6 hours as needed for relief of pain or comfort of fever AVOID  taking more than 4000mg   in a 24 hour period (can cause liver damage in higher doses).    Prescriptions: AZITHROMYCIN 250 MG  TABS (AZITHROMYCIN) aas per package  # 1 pack x 0   Entered and Authorized by:   Marga Melnick MD   Signed by:   Marga Melnick MD on 10/02/2007   Method used:   Print then Give to Patient   RxID:   [id number]  ]

## 2010-11-27 NOTE — Procedures (Signed)
Summary: Colonoscopy   Colonoscopy  Procedure date:  02/10/2009  Findings:      Location:  [facility].    Procedures Next Due Date:    Colonoscopy: 02/2019  COLONOSCOPY PROCEDURE REPORT  PATIENT:  Alex [name], Alex [name]  MR#:  [id number] BIRTHDATE:   Nov 05, 1955, 52 yrs. old   GENDER:   male  ENDOSCOPIST:   Vania Rea. Jarold Motto, MD, [facility] Referred by:   PROCEDURE DATE:  02/10/2009 PROCEDURE:  Colonoscopy, diagnostic ASA CLASS:   Class II INDICATIONS: screening   MEDICATIONS:    Fentanyl 100 mcg IV, Versed 10 mg IV  DESCRIPTION OF PROCEDURE:   After the risks benefits and alternatives of the procedure were thoroughly explained, informed consent was obtained.  Digital rectal exam was performed and revealed no abnormalities.   The LB [id number] endoscope was introduced through the anus and advanced to the cecum, which was identified by both the appendix and ileocecal valve, without limitations.  The quality of the prep was excellent, using MoviPrep.  The instrument was then slowly withdrawn as the colon was fully examined. <<PROCEDUREIMAGES>>      <<OLD IMAGES>>  FINDINGS:  Moderate diverticulosis was found sigmoid to descending  This was otherwise a normal examination of the colon.   Retroflexed views in the rectum revealed no abnormalities.    The scope was then withdrawn from the patient and the procedure completed.  COMPLICATIONS:   None  ENDOSCOPIC IMPRESSION:  1) Moderate diverticulosis in the sigmoid to descending  2) Otherwise normal examination RECOMMENDATIONS:  1) You should continue follow current colorectal cancer screening guidelines for "routine risk" patients with a repeat colonoscopy in 10 years. I do not recommend other colon cancer screening prior to then (including stool tests for microscopic blood) unless new symptoms arise.      REPEAT EXAM:   No   _______________________________ Vania Rea. Jarold Motto, MD, Clement[facility] Graham  CC: Pecola Lawless  MD

## 2010-11-27 NOTE — Assessment & Plan Note (Signed)
Summary: MONTHLY B12 SHOT...LSW.  Nurse Visit   Allergies: No Known Drug Allergies  Medication Administration  Injection # 1:    Medication: Vit B12 1000 mcg    Diagnosis: B12 DEFICIENCY (ICD-266.2)    Route: IM    Site: R deltoid    Exp Date: 11/2011    Lot #: [id number]    Mfr: American Regent    Patient tolerated injection without complications    Given by: Merri Ray CMA (AAMA) (May 16, 2010 11:49 AM)  Orders Added: 1)  Vit B12 1000 mcg [J3420]

## 2010-11-27 NOTE — Letter (Signed)
Summary: Patient Notice-Barrett's [facility]  [address] [place], [place] [address]   Phone: [phone]  Fax: [phone]        February 19, 2009 MRN: [id number]    Alex [name] [address] [place], [place]  [address]    Dear Alex [name],  I am pleased to inform you that the biopsies taken during your recent endoscopic examination did not show any evidence of cancer upon pathologic examination.  However, your biopsies indicate you have a condition known as Barrett's esophagus. While not cancer, it is pre-cancerous (can progress to cancer) and needs to be monitored with repeat endoscopic examination and biopsies.  Fortunately, it is quite rare that this develops into cancer, but careful monitoring of the condition along with taking your medication as prescribed is important in reducing the risk of developing cancer.  It is my recommendation that you have a repeat upper gastrointestinal endoscopic examination in 3 years.  Additional information/recommendations:  __Please call [phone] to schedule a return visit to further      evaluate your condition.  xx__Continue with treatment plan as outlined the day of your exam.  Please call us if you have or develop heartburn, reflux symptoms, any swallowing problems, or if you have questions about your condition that have not been fully answered at this time.  Sincerely,  Mardella Layman MD [facility]  This letter has been electronically signed by your physician.

## 2010-11-27 NOTE — Assessment & Plan Note (Signed)
Summary: CPX/NS/KDC   Vital Signs:  Patient profile:   55 year old male Height:      69.25 inches Weight:      200.4 pounds BMI:     29.49 Temp:     98.1 degrees F oral Resp:     14 per minute BP sitting:   132 / 88  (left arm) Cuff size:   large  Vitals Entered By: Shonna Chock (November 23, 2009 10:52 AM)  CC: CPX -patient already had labs , General Medical Evaluation, Hypertension Management Comments REVIEWED MED LIST, PATIENT AGREED DOSE AND INSTRUCTION CORRECT    Primary Care Provider:  Oren Bracket  CC:  CPX -patient already had labs , General Medical Evaluation, and Hypertension Management.  History of Present Illness: Alex [name] is here for a physical; his BP has been close to goal with addition of 1/2 of Losartan 100mg  once daily .  Hypertension History:      He denies headache, chest pain, palpitations, dyspnea with exertion, orthopnea, PND, peripheral edema, visual symptoms, neurologic problems, syncope, and side effects from treatment.  He notes no problems with any antihypertensive medication side effects.        Positive major cardiovascular risk factors include male age 66 years old or older, hyperlipidemia, and hypertension.  Negative major cardiovascular risk factors include no history of diabetes, negative family history for ischemic heart disease, and non-tobacco-user status.        Further assessment for target organ damage reveals no history of ASHD, stroke/TIA, or peripheral vascular disease.     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 Eyes:  Denies blurring, double vision, and vision loss-both eyes. ENT:  Denies difficulty swallowing and hoarseness. CV:  Denies leg cramps with exertion, lightheadness, and near fainting. Resp:  Denies cough, excessive snoring, hypersomnolence, and sputum productive. GI:  Denies abdominal pain, bloody stools, dark tarry stools, and indigestion. GU:  Denies discharge, dysuria, and hematuria. MS:  Denies joint pain, joint redness, joint swelling, low back pain, mid back pain, and thoracic pain; L Achilles tendonitis evaluated by Sports Medicine. Derm:  Denies changes in nail beds, dryness, hair loss, and lesion(s). Neuro:  Denies disturbances in coordination, numbness, poor balance, and tingling. Psych:  Denies anxiety and depression. Endo:  Denies cold intolerance, excessive hunger, excessive thirst, excessive urination, and heat intolerance. Heme:  Denies abnormal bruising and bleeding. Allergy:  Denies itching eyes and sneezing.  Physical Exam  General:  well-nourished,in no acute distress; alert,appropriate and cooperative throughout examination Head:  Normocephalic and atraumatic without obvious abnormalities. No apparent alopecia Eyes:  No corneal or conjunctival inflammation noted. EOMI. Perrla. Funduscopic exam benign, without hemorrhages, exudates or papilledema. Vision grossly normal. Ears:  External ear exam shows no significant lesions or deformities.  Otoscopic examination reveals clear canals, tympanic membranes are intact bilaterally without bulging, retraction, inflammation or discharge. Hearing is grossly normal bilaterally. Nose:  External  nasal examination shows no deformity or  inflammation. Nasal mucosa are pink and moist without lesions or exudates. Mouth:  Oral mucosa and oropharynx without lesions or exudates.  Teeth in good repair.  mild pharyngeal erythema.   Neck:  No deformities, masses, or tenderness noted. Lungs:  Normal respiratory effort, chest expands symmetrically. Lungs are clear to auscultation, no crackles or wheezes. Heart:  Normal rate and regular rhythm. S1 and S2 normal without gallop, murmur, click, rub . S4 Abdomen:  Bowel sounds positive,abdomen soft and non-tender without masses, organomegaly or hernias noted. Rectal:  No external abnormalities noted. Normal sphincter tone. No rectal masses or tenderness. Genitalia:  Testes bilaterally descended without nodularity, tenderness or masses. No scrotal masses or lesions. No penis lesions or urethral discharge. L testicle smaller.L varicocele.   Prostate:  Prostate gland firm and smooth, no enlargement, nodularity, tenderness, mass, asymmetry or induration. Msk:  No deformity or scoliosis noted of thoracic or lumbar spine.   Pulses:  R and L carotid,radial,dorsalis pedis and posterior tibial pulses are full and equal bilaterally Extremities:  No clubbing, cyanosis, edema, or deformity noted with normal full range of motion of all joints.   Mild crepitus L knee Neurologic:  alert & oriented X3 and DTRs symmetrical and normal.   Skin:  Intact without suspicious lesions or rashes Cervical Nodes:  No lymphadenopathy noted Axillary Nodes:  No palpable lymphadenopathy Inguinal Nodes:  No significant adenopathy Psych:  memory intact for recent and remote, normally interactive, and good eye contact.     Impression & Recommendations:  Problem # 1:  ROUTINE GENERAL MEDICAL EXAM@HEALTH  CARE FACL (ICD-V70.0)  Orders: EKG w/ Interpretation (93000)  Problem # 2:  HYPERLIPIDEMIA (ICD-272.4)  Problem # 3:  BARRETT'S ESOPHAGUS, HX OF (ICD-V12.79)  Problem # 4:  B12 DEFICIENCY (ICD-266.2)  Problem # 5:   ACHILLES TENDINITIS (ICD-726.71)  Problem # 6:  NONSPEC ELEVATION OF LEVELS OF TRANSAMINASE/LDH (ICD-790.4)  Problem # 7:  GILBERT'S SYNDROME (ICD-277.4)  Complete Medication List: 1)  Allegra 180 Mg Tabs (Fexofenadine hcl) .... Take one tablet daily as needed 2)  Nexium 40 Mg Cpdr (Esomeprazole magnesium) .... Take 1 capsule by mouth once a day 3)  Cartia Xt 240 Mg Cp24 (Diltiazem hcl coated beads) .Marland Kitchen.. 1 by mouth qd 4)  Hydrochlorothiazide 12.5 Mg Caps (Hydrochlorothiazide) .Marland Kitchen.. 1 qd 5)  Cyanocobalamin 1000 Mcg/ml Inj Soln (Cyanocobalamin) .Marland Kitchen.. 1 cc im weelky x 3 weeks the 1 inj monthly 6)  Losartan Potassium 100 Mg Tabs (Losartan potassium) .... 1/2 once daily  Hypertension Assessment/Plan:      The patient's hypertensive risk group is category B: At least one risk factor (excluding diabetes) with no target organ damage.  His calculated 10 year risk of coronary heart disease is 11 %.  Today's blood pressure is 132/88.    Patient Instructions: 1)  READ Dr Beckey Downing' s book Eat, Drink & Be Healthy . 2)  Please schedule a follow-up appointment in 4 months. 3)  Hepatic Panel prior to visit, ICD-9:790.4; B12 level ICD-9 : 266.2; 4)   NMR Lipoprofile Lipid Panel prior to visit, ICD-9:272.4. Avoid excess Tylenol & vitamin A. Stretches for Achilles tendon once daily  Prescriptions: HYDROCHLOROTHIAZIDE 12.5 MG  CAPS (HYDROCHLOROTHIAZIDE) 1 qd  #90 x 1   Entered and Authorized by:   Marga Melnick MD   Signed by:   Marga Melnick MD on 11/23/2009   Method used:   Print then Give to Patient   RxID:   [id number] CARTIA XT 240  MG  CP24 (DILTIAZEM HCL COATED BEADS) 1 by mouth qd  #90 x 3   Entered and Authorized by:   Marga Melnick MD   Signed by:   Marga Melnick MD on 11/23/2009   Method used:   Print then Give to Patient   RxID:   [id number] ALLEGRA 180 MG TABS (FEXOFENADINE HCL) take one tablet daily as needed  #90 x 3   Entered and Authorized by:   Marga Melnick MD   Signed  by:   Marga Melnick MD on 11/23/2009   Method used:   Print then Give to Patient   RxID:   [id number]

## 2010-11-27 NOTE — Progress Notes (Signed)
Summary: prior autho   Phone Note Call from Patient Call back at [phone]   Caller: Patient Call For: PATTERSON  Reason for Call: Talk to Nurse Details for Reason: prior autho  Summary of Call: need prior autho with [facility] on nexium  please call # [phone]( for his yearly approval) Initial call taken by: Guadlupe Spanish [facility],  January 06, 2009 9:16 AM  Follow-up for Phone Call        patient aware when his next rx and its denied from the pharm i can get it approved. Follow-up by: Harlow Mares CMA,  January 06, 2009 1:25 PM

## 2010-11-27 NOTE — Progress Notes (Signed)
Summary: Lab results  Medications Added CYANOCOBALAMIN 1000 MCG/ML INJ SOLN (CYANOCOBALAMIN) 1 cc IM weekly X 3 then switch to Nasal spray NASCOBAL 500 MCG/0.1ML SOLN (CYANOCOBALAMIN) One spray one nostral once a week.       Phone Note Call from Patient Call back at [phone]   Caller: Patient Call For: Dr. Sharlett Iles Reason for Call: Talk to Doctor Summary of Call: Pt. calling regarding some lab results Initial call taken by: Webb Laws,  October 05, 2009 11:31 AM  Follow-up for Phone Call        Pt informed results of labs.  Scheduled pt for b12 injections weekly x 3.  Then pt req nasal spray. Follow-up by: Alberteen Spindle RN,  October 05, 2009 12:17 PM    New/Updated Medications: CYANOCOBALAMIN 1000 MCG/ML INJ SOLN (CYANOCOBALAMIN) 1 cc IM weekly X 3 then switch to Nasal spray NASCOBAL 500 MCG/0.1ML SOLN (CYANOCOBALAMIN) One spray one nostral once a week.

## 2010-11-27 NOTE — Assessment & Plan Note (Signed)
Summary: B12 monthly injection 266.2  Nurse Visit   Allergies: No Known Drug Allergies  Medication Administration  Injection # 1:    Medication: Vit B12 1000 mcg    Diagnosis: B12 DEFICIENCY (ICD-266.2)    Route: IM    Site: R deltoid    Exp Date: 11/29/2011    Lot #: [id number]    Mfr: American Regent    Comments: Monthly injection    Patient tolerated injection without complications    Given by: June McMurray [place] Deborra Medina) (April 18, 2010 11:48 AM)  Orders Added: 1)  Vit B12 1000 mcg [J3420]   Medication Administration  Injection # 1:    Medication: Vit B12 1000 mcg    Diagnosis: B12 DEFICIENCY (ICD-266.2)    Route: IM    Site: R deltoid    Exp Date: 11/29/2011    Lot #: [id number]    Mfr: American Regent    Comments: Monthly injection    Patient tolerated injection without complications    Given by: June McMurray [place] Deborra Medina) (April 18, 2010 11:48 AM)  Orders Added: 1)  Vit B12 1000 mcg [E5631]

## 2010-11-27 NOTE — Letter (Signed)
Summary: Generic Letter  [facility] [place]  [address] [place], [place] [address]   Phone: [phone]  Fax: [phone]    11/24/2008  SAMAR DASS [address] [place], [place]  [address]  Dear Mr. Garmany,           Sincerely,   Chrae Copywriter, advertising at [employer]

## 2010-11-27 NOTE — Assessment & Plan Note (Signed)
Summary: CPX/ RESCHEDULED//CYD   Vital Signs:  Patient Profile:   55 Years Old Male Weight:      198.25 pounds Pulse rate:   76 / minute Pulse rhythm:   regular Resp:     15 per minute BP sitting:   160 / 90  (left arm) Cuff size:   large  Pt. in pain?   no  Vitals Entered By: Wendall Stade (September 17, 2007 10:54 AM)                   Chief Complaint:  cpx.  General Medical HPI:        Currently he is doing well and is not having any significant new complaints.  Hypertension History:      He denies headache, chest pain, palpitations, dyspnea with exertion, orthopnea, PND, peripheral edema, visual symptoms, neurologic problems, syncope, and side effects from treatment.  He notes no problems with any antihypertensive medication side effects.  Further comments include: BP @ home 135-140/87.        Positive major cardiovascular risk factors include male age 1 years old or older, hyperlipidemia, and hypertension.  Negative major cardiovascular risk factors include non-tobacco-user status.     Current Allergies (reviewed today): ! ACE INHIBITORS  Past Medical History:    Hypertension    GERD   Family History:    Father: Kidney CA (died at 60), stomach and throat CA, HTN, bypass sx., smoker    Mother: HTN    Siblings: sister-DM  Social History:    Occupation: dry Education officer, environmental business    Married    Never Smoked    Alcohol use-no    Drug use-no    Regular exercise-yes   Risk Factors:  Tobacco use:  never Drug use:  no Alcohol use:  no Exercise:  yes   Review of Systems  General      Denies chills, fatigue, fever, loss of appetite, malaise, sleep disorder, sweats, weakness, and weight loss.  Eyes      Denies blurring, discharge, double vision, eye irritation, eye pain, halos, itching, light sensitivity, red eye, vision loss-1 eye, and vision loss-both eyes.      Last exam 5/08, NAD  ENT      Denies decreased hearing, difficulty swallowing, ear  discharge, earache, hoarseness, nasal congestion, nosebleeds, postnasal drainage, ringing in ears, sinus pressure, and sore throat.  CV      Denies bluish discoloration of lips or nails, chest pain or discomfort, difficulty breathing at night, difficulty breathing while lying down, leg cramps with exertion, palpitations, shortness of breath with exertion, swelling of feet, and swelling of hands.  Resp      Denies cough, excessive snoring, hypersomnolence, morning headaches, shortness of breath, and sputum productive.  GI      See Dr Norval Gable records  GU      Complains of nocturia.      Denies decreased libido, discharge, dysuria, erectile dysfunction, genital sores, hematuria, incontinence, urinary frequency, and urinary hesitancy.      Nocturia X 1  MS      Denies joint pain, joint redness, joint swelling, loss of strength, low back pain, mid back pain, muscle aches, muscle , cramps, muscle weakness, stiffness, and thoracic pain.      Severe ankle strain last yr, no sequellae  Derm      Denies changes in color of skin, changes in nail beds, dryness, excessive perspiration, flushing, hair loss, itching, lesion(s), poor wound healing, and rash.  Neuro  Denies difficulty with concentration, disturbances in coordination, headaches, memory loss, numbness, poor balance, sensation of room spinning, and tingling.  Psych      Denies anxiety, depression, easily angered, easily tearful, and irritability.  Endo      Denies cold intolerance, excessive hunger, excessive thirst, excessive urination, heat intolerance, polyuria, and weight change.  Heme      Denies abnormal bruising and bleeding.  Allergy      Complains of hives or rash and sneezing.      Denies itching eyes, persistent infections, and seasonal allergies.      Perennial sx; hives X 1    Physical Exam  General:     Well-developed,well-nourished,in no acute distress; alert,appropriate and cooperative throughout  examination Head:     Normocephalic and atraumatic without obvious abnormalities. No apparent alopecia or balding. Eyes:     No corneal or conjunctival inflammation noted.Perrla. Funduscopic exam benign, without hemorrhages, exudates or papilledema. Art narrowing Ears:     Slight erythema TMs Nose:     External nasal examination shows no deformity or inflammation. Nasal mucosa are pink and moist without lesions or exudates. Mouth:     Mild erythema pharynx Neck:     No deformities, masses, or tenderness noted. Lungs:     Normal respiratory effort, chest expands symmetrically. Lungs are clear to auscultation, no crackles or wheezes. Heart:     normal rate, regular rhythm, no gallop, no rub, no JVD, no HJR, and grade  1/2 -1 /6 systolic murmur.  BP 150/95 Abdomen:     Bowel sounds positive,abdomen soft and non-tender without masses, organomegaly or hernias noted. Rectal:     No external abnormalities noted. Normal sphincter tone. No rectal masses or tenderness. FOB neg Genitalia:     Testes bilaterally descended without nodularity, tenderness or masses. No scrotal masses or lesions. No penis lesions or urethral discharge. Prostate:     1+ enlarged w/o nodules.   Msk:     No deformity or scoliosis noted of thoracic or lumbar spine.   Pulses:     R and L carotid,radial,dorsalis pedis and posterior tibial pulses are full and equal bilaterally Extremities:     No clubbing, cyanosis, edema, or deformity noted with normal full range of motion of all joints.   Neurologic:     strength normal in all extremities, gait normal, and DTRs symmetrical and normal.   Skin:     Intact without suspicious lesions or rashes Cervical Nodes:     No lymphadenopathy noted Axillary Nodes:     No palpable lymphadenopathy Psych:     Oriented X3, memory intact for recent and remote, normally interactive, good eye contact, not anxious appearing, and not depressed appearing.      Impression &  Recommendations:  Problem # 1:  ROUTINE GENERAL MEDICAL EXAM@HEALTH  CARE FACL (ICD-V70.0)  Orders: EKG w/ Interpretation (93000) TLB-Lipid Panel (80061-LIPID) TLB-BMP (Basic Metabolic Panel-BMET) (80048-METABOL) TLB-CBC Platelet - w/Differential (85025-CBCD) TLB-Hepatic/Liver Function Pnl (80076-HEPATIC) TLB-TSH (Thyroid Stimulating Hormone) (84443-TSH) TLB-PSA (Prostate Specific Antigen) (84153-PSA)   Problem # 2:  HYPERTENSION, ESSENTIAL NOS (ICD-401.9)  His updated medication list for this problem includes:    Cartia Xt 240 Mg Cp24 (Diltiazem hcl coated beads) .Marland Kitchen... 1 by mouth qd    Hydrochlorothiazide 12.5 Mg Caps (Hydrochlorothiazide) .Marland Kitchen... 1 qd   Problem # 3:  BARRETT'S ESOPHAGUS, HX OF (ICD-V12.79)  Complete Medication List: 1)  Allegra 180 Mg Tabs (Fexofenadine hcl) .... Take one tablet daily as needed 2)  Nexium 40  Mg Cpdr (Esomeprazole magnesium) .... Take 1 capsule by mouth once a day 3)  Cartia Xt 240 Mg Cp24 (Diltiazem hcl coated beads) .Marland Kitchen.. 1 by mouth qd 4)  Hydrochlorothiazide 12.5 Mg Caps (Hydrochlorothiazide) .Marland Kitchen.. 1 qd  Hypertension Assessment/Plan:      The patient's hypertensive risk group is category B: At least one risk factor (excluding diabetes) with no target organ damage.  Today's blood pressure is 160/90.     Patient Instructions: 1)   BP goal = average < 130/85; LDL goal = < 100    Prescriptions: HYDROCHLOROTHIAZIDE 12.5 MG  CAPS (HYDROCHLOROTHIAZIDE) 1 qd  #90 x 3   Entered and Authorized by:   Marga Melnick MD   Signed by:   Marga Melnick MD on 09/17/2007   Method used:   Print then Give to Patient   RxID:   [id number] CARTIA XT 240 MG  CP24 (DILTIAZEM HCL COATED BEADS) 1 by mouth qd  #90 x 3   Entered and Authorized by:   Marga Melnick MD   Signed by:   Marga Melnick MD on 09/17/2007   Method used:   Print then Give to Patient   RxID:   [id number]  ]

## 2010-11-27 NOTE — Medication Information (Signed)
Summary: Nexium Approved/[employer]  Nexium Approved/[employer]   Imported By: Phillis Knack 03/09/2010 10:46:04  _____________________________________________________________________  External Attachment:    Type:   Image     Comment:   External Document

## 2010-11-27 NOTE — Assessment & Plan Note (Signed)
Summary: 1st of 3 B12 inj/266.2/dfs  Nurse Visit   Allergies: No Known Drug Allergies  Medication Administration  Injection # 1:    Medication: Vit B12 1000 mcg    Diagnosis: BARRETT'S ESOPHAGUS, HX OF (ICD-V12.79)    Route: IM    Site: L deltoid    Exp Date: 07/29/2011    Lot #: [id number]    Mfr: American Regent    Patient tolerated injection without complications    Given by: Lowry Ram NCMA (October 11, 2009 3:34 PM)  Orders Added: 1)  Vit B12 1000 mcg [J3420]

## 2010-11-27 NOTE — Assessment & Plan Note (Signed)
Summary: Monthly B12 , 266.2  Nurse Visit   Allergies: No Known Drug Allergies  Medication Administration  Injection # 1:    Medication: Vit B12 1000 mcg    Diagnosis: B12 DEFICIENCY (ICD-266.2)    Route: IM    Site: L deltoid    Exp Date: 04/2012    Lot #: [id number]    Mfr: [place]    Patient tolerated injection without complications    Given by: Bernita Buffy CMA ([place]) (July 18, 2010 11:34 AM)  Orders Added: 1)  Vit B12 1000 mcg [A0045]

## 2010-11-27 NOTE — Assessment & Plan Note (Signed)
Summary: YEARLY CK-UP/FH    History of Present Illness Visit Type: follow up Primary GI MD: Sheryn Bison MD FACP FAGA Primary Provider: Oren Bracket Requesting Provider: n/a Chief Complaint: Yearly Follow up for Nexium refills History of Present Illness:   Alex [name] is asymptomatic taking Nexium 40 mg a day. His chronic GERD and chronic Barrett's because going back 15 years with periodic endoscopies and biopsies. He denies current chest pain, dysphagia, any gastrointestinal or general medical problems. His hypertension is managed by Dr. Marga Melnick. Patient due for colonoscopy because of his age but has deferred this to the time of his followup endoscopy in February. His family history is noncontributory.   GI Review of Systems      Denies abdominal pain, acid reflux, belching, bloating, chest pain, dysphagia with liquids, dysphagia with solids, heartburn, loss of appetite, nausea, vomiting, vomiting blood, weight loss, and  weight gain.        Denies anal fissure, black tarry stools, change in bowel habit, constipation, diarrhea, diverticulosis, fecal incontinence, heme positive stool, hemorrhoids, irritable bowel syndrome, jaundice, light color stool, liver problems, rectal bleeding, and  rectal pain.     Prior Medications Reviewed Using: List Brought by Patient  Updated Prior Medication List: ALLEGRA 180 MG TABS (FEXOFENADINE HCL) take one tablet daily as needed NEXIUM 40 MG CPDR (ESOMEPRAZOLE MAGNESIUM) Take 1 capsule by mouth once a day CARTIA XT 240 MG  CP24 (DILTIAZEM HCL COATED BEADS) 1 by mouth qd HYDROCHLOROTHIAZIDE 12.5 MG  CAPS (HYDROCHLOROTHIAZIDE) 1 qd  Current Allergies: No known allergies   Past Medical History:    Reviewed history from 09/17/2007 and no changes required:       Hypertension       GERD  Past Surgical History:    Reviewed history from 09/15/2007 and no changes required:       Vasectomy       EGD  (done 1998, 2000, and 2004)  11-01-2005  Endo.  (hiatal hernia, Barrett's)   Family History:    Reviewed history from 09/17/2007 and no changes required:       Father: Kidney CA (died at 33), stomach and throat CA, HTN, bypass sx., smoker       Mother: HTN       Siblings: sister-DM       No FH of Colon Cancer:  Social History:    Reviewed history from 09/17/2007 and no changes required:       Occupation: dry cleaning business       Married       Never Smoked       Alcohol use-no       Drug use-no       Regular exercise-yes     Vital Signs:  Patient Profile:   55 Years Old Male Height:     70 inches Weight:      200.50 pounds BMI:     28.87 Pulse rate:   70 / minute Pulse rhythm:   regular BP sitting:   160 / 82  (left arm)  Vitals Entered By: Merri Ray CMA (August 16, 2008 11:00 AM)                  Physical Exam  General:     Well developed, well nourished, no acute distress.healthy appearing.   Head:     Normocephalic and atraumatic. Eyes:     PERRLA, no icterus. Neck:     Supple; no masses or thyromegaly. Lungs:  Clear throughout to auscultation. Heart:     Regular rate and rhythm; no murmurs, rubs,  or bruits. Abdomen:     Soft, nontender and nondistended. No masses, hepatosplenomegaly or hernias noted. Normal bowel sounds. Extremities:     No clubbing, cyanosis, edema or deformities noted. Neurologic:     Alert and  oriented x4;  grossly normal neurologically. Psych:     Alert and cooperative. Normal mood and affect.    Impression & Recommendations:  Problem # 1:  GERD (ICD-530.81) Assessment: Improved continue daily PPI therapyin reflux regime.  Problem # 2:  BARRETT'S ESOPHAGUS, HX OF (ICD-V12.79) Assessment: Unchanged screening endoscopy and biopsies do in 2010 as per clinical protocol.  Problem # 3:  ROUTINE GENERAL MEDICAL EXAM@HEALTH  CARE FACL (ICD-V70.0) Assessment: Unchanged colonoscopy also will be done in 2010 since he has reached age  26.  Problem # 4:  HYPERTENSION, ESSENTIAL NOS (ICD-401.9) Assessment: Improved continue medications per Dr. Alwyn Ren   Patient Instructions: 1)  Copy Sent To:Dr. Marga Melnick 2)  Please Continue current medications. 3)  reminder given patient for endoscopy colonoscopy due in February 2010.    Prescriptions: NEXIUM 40 MG CPDR (ESOMEPRAZOLE MAGNESIUM) Take 1 capsule by mouth once a day  #30 x 11   Entered by:   Christie Nottingham CMA   Authorized by:   Mardella Layman MD [facility]   Signed by:   Mardella Layman MD FACG,FAGA on 08/16/2008   Method used:   Electronically to        [facility]  [address] # [facility]* (retail)       [address]       [place], [place]  [address]       Ph: [phone] or [phone]       Fax: [phone]   RxID:   [id number]  ]

## 2010-11-27 NOTE — Progress Notes (Signed)
Summary: Appointment Due  Phone Note Outgoing Call Call back at Home Phone [phone]   Call placed by: Shonna Chock,  October 09, 2009 4:20 PM Call placed to: Patient Details for Reason: Appointment Due Summary of Call: Left message on machine informing patient of Dr.Hopper's instruction  he needs to make appt ; bring all meds & BP readings   Alex [name]  October 09, 2009 4:22 PM      Appended Document: Appointment Due Patient called back to say his B/P was high with Dr.Patterson because its always high when he comes to the Dr., Patient said he doesn't have any B/P readings right now but he will record it and come in the the week after the holiday. Appointment was scheduled.

## 2010-11-27 NOTE — Assessment & Plan Note (Signed)
Summary: Monthly B12/dfs  Nurse Visit   Allergies: No Known Drug Allergies  Medication Administration  Injection # 1:    Medication: Vit B12 1000 mcg    Diagnosis: B12 DEFICIENCY (ICD-266.2)    Route: IM    Site: L deltoid    Exp Date: 08/29/2011    Lot #: [id number]    Mfr: [employer]    Patient tolerated injection without complications    Given by: Marlon Pel CMA Deborra Medina) (December 20, 2009 12:19 PM)  Orders Added: 1)  Vit B12 1000 mcg [O8325]

## 2010-11-27 NOTE — Assessment & Plan Note (Signed)
Summary: B12 SHOT  Nurse Visit   Allergies: No Known Drug Allergies  Medication Administration  Injection # 1:    Medication: Vit B12 1000 mcg    Diagnosis: B12 DEFICIENCY (ICD-266.2)    Route: IM    Site: L deltoid    Exp Date: 09/28/2011    Lot #: [id number]    Mfr: [employer]    Patient tolerated injection without complications    Given by: Marlon Pel CMA Deborra Medina) (February 21, 2010 11:44 AM)  Orders Added: 1)  Vit B12 1000 mcg [Z7471]

## 2010-11-27 NOTE — Progress Notes (Signed)
Summary: New rx for Fexofenadine 181m tab  Phone Note Call from Patient   Caller: Patient Summary of Call: DR.HOPPER   CALL BACK [phone] Pt needs a new rx for Fexofenadine 180 mg tab written for 90 days with 3 refills. Initial call taken by: Vanessa JMartinique  February 08, 2008 10:17 AM  Follow-up for Phone Call        SPOKE WITH PATIENT, PATIENT WOULD LIKE TO PICK-UP RX. ...................................................................Marland Kitchenhrae Malloy  February 08, 2008 11:07 AM       Prescriptions: ALLEGRA 180 MG TABS (FEXOFENADINE HCL) take one tablet daily as needed  #90 x 3   Entered by:   CGeorgette Dover  Authorized by:   WUnice CobbleMD   Signed by:   CGeorgette Doveron 02/08/2008   Method used:   Print then Give to Patient   RxID:   [id number]

## 2010-11-27 NOTE — Progress Notes (Signed)
Summary: mail order refill   Phone Note Refill Request Call back at [phone]   Refills Requested: Medication #1:  CARTIA XT 240 MG  CP24 1 by mouth qd patient needs a 90 day supply to send to his mail order   Method Requested: Pick up at Office Initial call taken by: Charolette Child,  January 18, 2008 9:39 AM  Follow-up for Phone Call        left message for patient to pick up script....................................................................Marland KitchenWendall Stade  January 19, 2008 5:21 PM       Prescriptions: CARTIA XT 240 MG  CP24 (DILTIAZEM HCL COATED BEADS) 1 by mouth qd  #90 x 3   Entered by:   Shonna Chock   Authorized by:   Marga Melnick MD   Signed by:   Shonna Chock on 01/19/2008   Method used:   Print then Give to Patient   RxID:   [id number]

## 2010-11-27 NOTE — Assessment & Plan Note (Signed)
Summary: monthly b-12 inj/all  Nurse Visit   Allergies: No Known Drug Allergies  Medication Administration  Injection # 1:    Medication: Vit B12 1000 mcg    Diagnosis: B12 DEFICIENCY (ICD-266.2)    Route: IM    Site: L deltoid    Exp Date: 01/27/2012    Lot #: [id number]    Mfr: [employer]    Comments:  Next Monthly B12 Injection appt made for 04-18-10 at 11:30 AM.    Patient tolerated injection without complications    Given by: Lowry Ram NCMA (Mar 21, 2010 11:42 AM)

## 2010-11-27 NOTE — Miscellaneous (Signed)
Summary: Nexium Approval     Case ID: [id number] Member Number: [id number] Case Type: Initial Review Case Start Date: 03/05/2010 Case Status: Coverage has been APPROVED. You will receive a confirmation letter confirming approval of this medication. The patient will also be notified of this approval via an automated outbound phone call or a letter. Please allow approximately 2 hours to update our system with the approval. Once updated, the prescription can be re-submitted.   Coverage Start Date: 02/12/2010 Coverage End Date: 03/05/2011  Patient First Name: Alex Patient Last Name: [name] DOB: 01-10-1956 Patient Street Address: [place] Patient State: [place] Patient Zip: [phone]  Drug Name & Strength: Nexium 40 Mg

## 2010-11-27 NOTE — Letter (Signed)
Summary: Results Follow up Letter  [facility] [place]  [address] [place], [place] [address]   Phone: [phone]  Fax: [phone]    09/21/2007 MRN: [id number]  Alex [name] [address] [place], [place]  [address]  Dear Mr. Goya,  The following are the results of your recent test(s):  Test         Result    Pap Smear:        Normal _____  Not Normal _____ Comments: ______________________________________________________ Cholesterol: LDL(Bad cholesterol):         Your goal is less than:         HDL (Good cholesterol):       Your goal is more than: Comments:  ______________________________________________________ Mammogram:        Normal _____  Not Normal _____ Comments:  ___________________________________________________________________ Hemoccult:        Normal _____  Not normal _______ Comments:    _____________________________________________________________________ Other Tests:  Please see attached results and comments   We routinely do not discuss normal results over the telephone.  If you desire a copy of the results, or you have any questions about this information we can discuss them at your next office visit.   Sincerely,

## 2010-11-27 NOTE — Assessment & Plan Note (Signed)
Summary: 266.2, monthly b-12 inj/all  Nurse Visit   Allergies: No Known Drug Allergies  Medication Administration  Injection # 1:    Medication: Vit B12 1000 mcg    Diagnosis: B12 DEFICIENCY (ICD-266.2)    Route: IM    Site: R deltoid    Exp Date: 09/28/2011    Lot #: [id number]    Mfr: [employer]    Patient tolerated injection without complications    Given by: Harlow Mares CMA (AAMA) (January 17, 2010 12:11 PM)  Orders Added: 1)  Vit B12 1000 mcg [J3420]

## 2010-11-27 NOTE — Procedures (Signed)
Summary: EGD   EGD  Procedure date:  02/10/2009  Findings:      Location: [facility]    Procedures Next Due Date:    EGD: 02/2012  ENDOSCOPY PROCEDURE REPORT  PATIENT:  Alex [name], Alex [name]  MR#:  [id number] BIRTHDATE:   July 27, 1956, 55 yrs. old   GENDER:   male  ENDOSCOPIST:   Vania Rea. Jarold Motto, MD, [facility] Referred by:   PROCEDURE DATE:  02/10/2009 PROCEDURE:  EGD with biopsy ASA CLASS:   Class II INDICATIONS: h/o Barrett's Esophagus   MEDICATIONS:    Fentanyl 25 mcg IV, Versed 2 mg IV TOPICAL ANESTHETIC:   Exactacain Spray  DESCRIPTION OF PROCEDURE:   After the risks benefits and alternatives of the procedure were thoroughly explained, informed consent was obtained.  [facility] [id number] endoscope was introduced through the mouth and advanced to the second portion of the duodenum, without limitations.  The instrument was slowly withdrawn as the mucosa was fully examined. <<PROCEDUREIMAGES>>      <<OLD IMAGES>>  Barrett's esophagus was found in the distal esophagus. THE LENGTH OF BARRETT'S HAS DECREASED TO 4 CM.MULTIPLE BX. DONE.    Retroflexed views revealed a hiatal hernia.  LARGE HH NOTED.  The scope was then withdrawn from the patient and the procedure completed.  COMPLICATIONS:   None  ENDOSCOPIC IMPRESSION:  1) Barrett's esophagus in the distal esophagus  2) A hiatal hernia  3) Barrett's esophagus  R/O DYSPLASIA. RECOMMENDATIONS:  1) await biopsy results  2) continue current meds  REPEAT EXAM:   No   _______________________________ Vania Rea. Jarold Motto, MD, [facility]    CC: Marga Melnick, MD        REPORT OF SURGICAL PATHOLOGY   Case #: [id number] Patient Name: Alex [name]. Office Chart Number:  [id number]   MRN: [id number] Pathologist: Beulah Gandy. Luisa Hart, MD DOB/Age  February 02, 1956 (Age: 55)    Gender: M Date Taken:  02/10/2009 Date Received: 02/13/2009   FINAL DIAGNOSIS   ***MICROSCOPIC EXAMINATION AND DIAGNOSIS***     ESOPHAGUS:  INTESTINAL METAPLASIA (GOBLET CELL METAPLASIA) CONSISTENT WITH BARRETT' S ESOPHAGUS.  NO DYSPLASIA OR MALIGNANCY IDENTIFIED.   COMMENT An Alcian Blue stain is performed to determine the presence of intestinal metaplasia (goblet cell metaplasia). The Alcian Blue stain shows intestinal metaplasia (goblet cell metaplasia).  The control stained appropriately.   kv Date Reported:  02/14/2009     Beulah Gandy. Luisa Hart, MD *** Electronically Signed Out By JDP ***     February 19, 2009 MRN: [id number]    [facility] [address] [place], [place]  [address]    Dear Mr. Amyx,  I am pleased to inform you that the biopsies taken during your recent endoscopic examination did not show any evidence of cancer upon pathologic examination.  However, your biopsies indicate you have a condition known as Barrett's esophagus. While not cancer, it is pre-cancerous (can progress to cancer) and needs to be monitored with repeat endoscopic examination and biopsies.  Fortunately, it is quite rare that this develops into cancer, but careful monitoring of the condition along with taking your medication as prescribed is important in reducing the risk of developing cancer.  It is my recommendation that you have a repeat upper gastrointestinal endoscopic examination in 3 years.  Additional information/recommendations:  __Please call [phone] to schedule a return visit to further      evaluate your condition.  xx__Continue with treatment plan as outlined the day of your exam.  Please call us if you have  or develop heartburn, reflux symptoms, any swallowing problems, or if you have questions about your condition that have not been fully answered at this time.  Sincerely,  Mardella Layman MD [facility]  This letter has been electronically signed by your physician.   This report was created from the original endoscopy report, which was reviewed and signed by the above listed endoscopist.

## 2010-11-27 NOTE — Assessment & Plan Note (Signed)
Summary: cpx--tl   Vital Signs:  Patient Profile:   55 Years Old Male Height:     69.25 inches Weight:      194 pounds Temp:     97.9 degrees F oral Pulse rate:   76 / minute Resp:     16 per minute BP sitting:   140 / 82  (left arm) Cuff size:   large  Vitals Entered By: Shonna Chock (November 16, 2008 8:36 AM)                  PCP:  Oren Bracket  Chief Complaint:  CPX WITH FASTING LABS  and PATIENT NEEDS DOCUMENTATION OF  CPX FOR INSURANCE REASONS.  History of Present Illness: Achilles tendon area pain intermittently X several weeks w/o trigger or injury. Worse when up during week,better over wekend. 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   Hypertension History:      He denies headache, chest pain, palpitations, dyspnea with exertion, orthopnea, PND, peripheral edema, visual symptoms, neurologic problems, syncope, and side effects from treatment.  He notes no problems with any antihypertensive medication side effects.  Further comments include: BP averages 135/85.        Positive major cardiovascular risk factors include male age 67 years old or older, hyperlipidemia, and hypertension.  Negative major cardiovascular risk factors include no history of diabetes, negative family history for ischemic heart disease, and non-tobacco-user status.        Further assessment for target organ damage reveals no history of ASHD, stroke/TIA, or peripheral vascular disease.    Lipid Management History:      Positive NCEP/ATP III risk factors include male age 65 years old or older, HDL cholesterol less than 40, and hypertension.  Negative NCEP/ATP III risk factors include non-diabetic, no family history for ischemic heart disease, non-tobacco-user status, no ASHD (atherosclerotic heart disease), no prior stroke/TIA, no peripheral vascular disease, and no history of aortic aneurysm.        Current Allergies: No known allergies   Past Medical History:    Hypertension    GERD/  Barrett's    Allergic rhinitis    Hyperlipidemia  Past Surgical History:    Vasectomy    EGD  (done periodically since 1990):hiatal hernia, Barrett's, Dr Jarold Motto   Family History:    Father: Kidney CA (died at 38), stomach and throat CA, HTN, bypass surgery @ 74, smoker    Mother: HTN    Siblings: sister-DM    No FH of Colon Cancer:    Occupation: dry cleaning business    Married    Never Smoked    Alcohol use-no    Drug use-no    Regular exercise-yes   Risk Factors:  Exercise:  yes    Times per week:  1-3    Type:  stationery bike w/o symptoms  Family History Risk Factors:    Family History of MI in females < 28 years old:  no    Family History of MI in males < 7 years old:  no   Review of Systems  The patient denies anorexia, fever, weight loss, weight gain, vision loss, decreased hearing, hoarseness, prolonged cough, hemoptysis, abdominal pain, melena, hematochezia, severe indigestion/heartburn, hematuria, incontinence, suspicious skin lesions, difficulty walking, depression, unusual weight change, abnormal bleeding, enlarged lymph nodes, and angioedema.         No urethral discharge; noiritis symptoms. No  dysphagia.  MS      Denies joint pain, joint redness, joint swelling,  low back pain, mid back pain, muscle aches, muscle weakness, and thoracic pain.      No PMH of gout   Physical Exam  General:     well-nourished,in no acute distress; alert,appropriate and cooperative throughout examination Head:     Normocephalic and atraumatic without obvious abnormalities. No apparent alopecia Eyes:     No corneal or conjunctival inflammation noted.Perrla. Funduscopic exam benign, without hemorrhages, exudates or papilledema.  Ears:     External ear exam shows no significant lesions or deformities.  Otoscopic examination reveals clear canals, tympanic membranes are intact bilaterally without bulging, retraction, inflammation or discharge. Hearing is grossly normal  bilaterally. Nose:     External nasal examination shows no deformity or inflammation. Nasal mucosa are pink and moist without lesions or exudates. R nare decreased in diameter Mouth:     Oral mucosa and oropharynx without lesions or exudates.  Teeth in good repair. pharyngeal erythema.   Neck:     No deformities, masses, or tenderness noted. Lungs:     Normal respiratory effort, chest expands symmetrically. Lungs are clear to auscultation, no crackles or wheezes. Heart:     Normal rate and regular rhythm. S1 and S2 normal without gallop, murmur, click, rub.S4  Abdomen:     Bowel sounds positive,abdomen soft and non-tender without masses, organomegaly or hernias noted. Rectal:     No external abnormalities noted. Normal sphincter tone. No rectal masses or tenderness. Genitalia:     Testes bilaterally descended without nodularity, tenderness or masses. No scrotal masses or lesions. No penis lesions or urethral discharge. Prostate:     R 1+ enlarged & firm w/o nodule. L WNL.   Msk:     No deformity or scoliosis noted of thoracic or lumbar spine.   Pulses:     R and L carotid,radial,dorsalis pedis and posterior tibial pulses are full and equal bilaterally Extremities:     No clubbing, cyanosis, edema, or deformity noted with normal full range of motion of all joints. Mild crepitus of knees. L Achilles nontender   Neurologic:     alert & oriented X3, gait normal, and DTRs symmetrical and normal.   Skin:     Intact without suspicious lesions or rashes Cervical Nodes:     No lymphadenopathy noted Axillary Nodes:     No palpable lymphadenopathy Psych:     memory intact for recent and remote, normally interactive, and good eye contact.      Impression & Recommendations:  Problem # 1:  ROUTINE GENERAL MEDICAL EXAM@HEALTH  CARE FACL (ICD-V70.0)  Orders: EKG w/ Interpretation (93000) Venipuncture (28413) TLB-Lipid Panel (80061-LIPID) TLB-BMP (Basic Metabolic Panel-BMET)  (80048-METABOL) TLB-CBC Platelet - w/Differential (85025-CBCD) TLB-Hepatic/Liver Function Pnl (80076-HEPATIC) TLB-TSH (Thyroid Stimulating Hormone) (84443-TSH) TLB-PSA (Prostate Specific Antigen) (84153-PSA)   Problem # 2:  ACHILLES TENDINITIS (ICD-726.71)  Problem # 3:  Hx of HYPERLIPIDEMIA (ICD-272.4)  Orders: Venipuncture (24401) TLB-Lipid Panel (80061-LIPID)   Problem # 4:  HYPERTENSION, ESSENTIAL NOS (ICD-401.9)  His updated medication list for this problem includes:    Cartia Xt 240 Mg Cp24 (Diltiazem hcl coated beads) .Marland Kitchen... 1 by mouth qd    Hydrochlorothiazide 12.5 Mg Caps (Hydrochlorothiazide) .Marland Kitchen... 1 qd  Orders: EKG w/ Interpretation (93000) Venipuncture (02725)   Problem # 5:  HYPERPLASIA PROSTATE UNS W/O UR OBST & OTH LUTS (ICD-600.90)  Orders: Venipuncture (36644) TLB-PSA (Prostate Specific Antigen) (84153-PSA)   Problem # 6:  BARRETT'S ESOPHAGUS, HX OF (ICD-V12.79)  Orders: Venipuncture (03474)   Complete Medication List: 1)  Allegra 180 Mg Tabs (Fexofenadine hcl) .... Take one tablet daily as needed 2)  Nexium 40 Mg Cpdr (Esomeprazole magnesium) .... Take 1 capsule by mouth once a day 3)  Cartia Xt 240 Mg Cp24 (Diltiazem hcl coated beads) .Marland Kitchen.. 1 by mouth qd 4)  Hydrochlorothiazide 12.5 Mg Caps (Hydrochlorothiazide) .Marland Kitchen.. 1 qd  Hypertension Assessment/Plan:      The patient's hypertensive risk group is category B: At least one risk factor (excluding diabetes) with no target organ damage.  His calculated 10 year risk of coronary heart disease is 18 %.  Today's blood pressure is 140/82.    Lipid Assessment/Plan:      Based on NCEP/ATP III, the patient's risk factor category is "2 or more risk factors and a calculated 10 year CAD risk of < 20%".  The patient's lipid goals have been set as follows: Total cholesterol goal is 200; LDL cholesterol goal is 110; HDL cholesterol goal is 40; Triglyceride goal is 150.  His LDL cholesterol goal has not been met.   Secondary causes for hyperlipidemia have been ruled out.  He has been counseled on adjunctive measures for lowering his cholesterol and has been provided with dietary instructions.     Patient Instructions: 1)  Your LDL goal = < 110; review Dr Gildardo Griffes Eat,Drink & Be Healthy for info on cholesterol. Arch supports & lace up shoes, am stretching & Glucosamine sulfate 1500 mg once daily X 6 weeks 2)  Check your Blood Pressure regularly. If it is above: 135/85 ON AVERAGE  you should make an appointment.

## 2010-11-27 NOTE — Miscellaneous (Signed)
Summary: previsit  Clinical Lists Changes  Medications: Added new medication of MOVIPREP 100 GM  SOLR (PEG-KCL-NACL-NASULF-NA ASC-C) take as directed with prep instructions - Signed Rx of MOVIPREP 100 GM  SOLR (PEG-KCL-NACL-NASULF-NA ASC-C) take as directed with prep instructions;  #1 x 0;  Signed;  Entered by: Joylene John RN;  Authorized by: Sable Feil MD [facility];  Method used: Electronically to [facility]. # [id number]*, [address] [place], [place], [place]  [address], Ph: [phone] or [phone], Fax: [phone]    Prescriptions: MOVIPREP 100 GM  SOLR (PEG-KCL-NACL-NASULF-NA ASC-C) take as directed with prep instructions  #1 x 0   Entered by:   Joylene John RN   Authorized by:   Sable Feil MD [facility]   Signed by:   Joylene John RN on 01/24/2009   Method used:   Electronically to        [facility]. # [id number]* (retail)       [address]       [place], [place]  [address]       Ph: [phone] or [phone]       Fax: [phone]   RxID:   [id number]

## 2010-11-27 NOTE — Assessment & Plan Note (Signed)
Summary: B12 Injection  Nurse Visit   Allergies: No Known Drug Allergies  Medication Administration  Injection # 1:    Medication: Vit B12 1000 mcg    Diagnosis: B12 DEFICIENCY (ICD-266.2)    Route: IM    Site: L deltoid    Exp Date: 01/27/2012    Lot #: [id number]    Mfr: [employer]    Comments: Made next monthly B12 appt for 07-18-10 at 11:30 AM    Patient tolerated injection without complications    Given by: Lowry Ram NCMA (June 20, 2010 12:15 PM)  Orders Added: 1)  Vit B12 1000 mcg [J3420]

## 2010-11-27 NOTE — Progress Notes (Signed)
Summary: HOP-HCTZ  Phone Note Refill Request Message from:  Fax from Pharmacy on July 19, 2008 12:28 PM  Refills Requested: Medication #1:  HYDROCHLOROTHIAZIDE 12.5 MG  CAPS 1 qd [facility] [address]-FAX:[id number]  Initial call taken by: Doristine Devoid,  July 19, 2008 12:44 PM      Prescriptions: HYDROCHLOROTHIAZIDE 12.5 MG  CAPS (HYDROCHLOROTHIAZIDE) 1 qd  #90 x 1   Entered by:   Kandice Hams   Authorized by:   Marga Melnick MD   Signed by:   Kandice Hams on 07/19/2008   Method used:   Faxed to ...       [facility]  [address] # [id number]* (retail)       [address]       [place], [place]  [address]       Ph: [phone] or [phone]       Fax: [phone]   RxID:   [id number]

## 2010-11-27 NOTE — Progress Notes (Signed)
Summary: Prior Auth. for Nexium  Medications Added NEXIUM 40 MG CPDR (ESOMEPRAZOLE MAGNESIUM) Take 1 capsule by mouth once a day       Phone Note Call from Patient Call back at [phone]   Caller: Patient Call For: Dr. Sharlett Iles Reason for Call: Talk to Nurse Summary of Call: Ins. needs prior auth. for his Nexium--and wants to know if he can get samples until it is processed. He is completely out of med. Initial call taken by: Webb Laws,  Mar 05, 2010 9:40 AM  Follow-up for Phone Call        LM for pt that samples are available.   Follow-up by: Alberteen Spindle RN,  Mar 05, 2010 11:08 AM    New/Updated Medications: NEXIUM 40 MG CPDR (ESOMEPRAZOLE MAGNESIUM) Take 1 capsule by mouth once a day

## 2010-11-27 NOTE — Progress Notes (Signed)
Summary: **Recent Labs**  Phone Note Outgoing Call Call back at Home Phone [phone]   Call placed by: Shonna Chock,  November 23, 2008 3:52 PM Summary of Call: Left message on machine for patient to return call when avaliable, Reason for call: Your LDL goal = < 110; all other labs are @ goal except glucose & total bilirubin. 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 Please take Pravastatin 20 mg at bedtime & check fasting labs after 10 weeks. ( A1c, lipids,hepatic panel; 790.29,995.20,272.4). Alex [name]  November 23, 2008 3:59 PM   Follow-up for Phone Call        SPOKE WITH PATIENT. MAIL RX WITH LABS.APPOINTMENT SCHEDULED. Follow-up by: Shonna Chock,  November 24, 2008 11:40 AM

## 2010-11-27 NOTE — Assessment & Plan Note (Signed)
Summary: B/P FOLLOW-UP/SCM   Vital Signs:  Patient profile:   55 year old male Weight:      201 pounds Pulse rate:   72 / minute Resp:     15 per minute BP sitting:   156 / 98  (left arm) Cuff size:   large  Vitals Entered By: Georgette Dover (October 24, 2009 3:09 PM) CC: Elevated B/P, Patient bought in medication bottles to verify med list, Hypertension Management Comments REVIEWED MED LIST, PATIENT AGREED DOSE AND INSTRUCTION CORRECT    Primary Care Provider:  Gwyndolyn Saxon Cayman Kielbasa,MD  CC:  Elevated B/P, Patient bought in medication bottles to verify med list, and Hypertension Management.  History of Present Illness: BP up @ GI appt 4 weeks ago; BP range 138/92-165/103.  Hypertension History:      He denies headache, chest pain, palpitations, dyspnea with exertion, orthopnea, PND, peripheral edema, visual symptoms, neurologic problems, syncope, and side effects from treatment.  He notes no problems with any antihypertensive medication side effects.        Positive major cardiovascular risk factors include male age 55 years old or older, hyperlipidemia, and hypertension.  Negative major cardiovascular risk factors include no history of diabetes, negative family history for ischemic heart disease, and non-tobacco-user status.        Further assessment for target organ damage reveals no history of ASHD, stroke/TIA, or peripheral vascular disease.     Allergies (verified): No Known Drug Allergies  Review of Systems Eyes:  Denies blurring, double vision, and vision loss-both eyes. ENT:  Denies nosebleeds. CV:  Denies leg cramps with exertion. Neuro:  Denies numbness and tingling.  Physical Exam  General:  well-nourished,in no acute distress; alert,appropriate and cooperative throughout examination Lungs:  Normal respiratory effort, chest expands symmetrically. Lungs are clear to auscultation, no crackles or wheezes. Heart:  Normal rate and regular rhythm. S1 and S2 normal without  gallop, murmur, click, rub .S4 with slurring  Abdomen:  Bowel sounds positive,abdomen soft and non-tender without masses, organomegaly or hernias noted. No AAA or bruits Pulses:  R and L carotid,radial,dorsalis pedis and posterior tibial pulses are full and equal bilaterally Extremities:  No clubbing, cyanosis, edema.   Impression & Recommendations:  Problem # 1:  HYPERTENSION, ESSENTIAL NOS (ICD-401.9)  His updated medication list for this problem includes:    Cartia Xt 240 Mg Cp24 (Diltiazem hcl coated beads) .Marland Kitchen... 1 by mouth qd    Hydrochlorothiazide 12.5 Mg Caps (Hydrochlorothiazide) .Marland Kitchen... 1 qd    Losartan Potassium 100 Mg Tabs (Losartan potassium) .Marland Kitchen... 1 once daily  Complete Medication List: 1)  Allegra 180 Mg Tabs (Fexofenadine hcl) .... Take one tablet daily as needed 2)  Nexium 40 Mg Cpdr (Esomeprazole magnesium) .... Take 1 capsule by mouth once a day 3)  Cartia Xt 240 Mg Cp24 (Diltiazem hcl coated beads) .Marland Kitchen.. 1 by mouth qd 4)  Hydrochlorothiazide 12.5 Mg Caps (Hydrochlorothiazide) .Marland Kitchen.. 1 qd 5)  Cyanocobalamin 1000 Mcg/ml Inj Soln (Cyanocobalamin) .Marland Kitchen.. 1 cc im weelky x 3 weeks the 1 inj monthly 6)  Losartan Potassium 100 Mg Tabs (Losartan potassium) .Marland Kitchen.. 1 once daily  Hypertension Assessment/Plan:      The patient's hypertensive risk group is category B: At least one risk factor (excluding diabetes) with no target organ damage.  His calculated 10 year risk of coronary heart disease is 14 %.  Today's blood pressure is 156/98.    Patient Instructions: 1)  Check your Blood Pressure regularly. Your goal = 135/85 ON AVERAGE. fasting  labs pre physical (V70.0, 401.9, 272.4,995.2) 2)  BMP prior to visit, ICD-9: 3)  Hepatic Panel prior to visit, ICD-9: 4)  Lipid Panel prior to visit, ICD-9: 5)  TSH prior to visit, ICD-9: 6)  CBC w/ Diff prior to visit, ICD-9: 7)  Urine-dip prior to visit, ICD-9: 8)  PSA prior to visit, ICD-9: 9)  Limit your Sodium (Salt) to less than 4 grams a day  (slightly less than 1 teaspoon) to prevent fluid retention, swelling, or worsening or symptoms. Prescriptions: LOSARTAN POTASSIUM 100 MG TABS (LOSARTAN POTASSIUM) 1 once daily  #30 x 5   Entered and Authorized by:   [name] MD   Signed by:   [name] MD on 10/24/2009   Method used:   Faxed to ...       [facility] # [id number]* (retail)       [address]       [place], [place]  [address]       Ph: [phone] or [phone]       Fax: [phone]   RxID:   [id number]

## 2010-11-27 NOTE — Assessment & Plan Note (Signed)
Summary: MONTHLY B12 SHOT...LSW.  Nurse Visit   Allergies: No Known Drug Allergies  Medication Administration  Injection # 1:    Medication: Vit B12 1000 mcg    Diagnosis: B12 DEFICIENCY (ICD-266.2)    Route: IM    Site: R deltoid    Exp Date: 10/12    Lot #: [id number]    Mfr: [place]    Patient tolerated injection without complications    Given by: Alberteen Spindle RN (November 22, 2009 12:17 PM)  Orders Added: 1)  Vit B12 1000 mcg [T2549]

## 2010-11-27 NOTE — Progress Notes (Signed)
Summary: Triage: Ongoing Ear Pain  Phone Note Call from Patient Call back at [phone]   Caller: Patient Summary of Call: Patient seen Dr.Senna Lape on Wed. and was DX with ear infection, Patient is taking meds RX, not any better, now jaw under left ear is hurting (mainly when eating). Patient said would like Dr.Abbegale Stehle like to RX something else. [facility] [address].    Alex [name]  January 19, 2010 11:25 AM   Follow-up for Phone Call        cefuroxime 500 two times a day #14 & Vicodin5/500 Follow-up by: Marga Melnick MD,  January 19, 2010 1:05 PM    New/Updated Medications: HYDROCODONE-ACETAMINOPHEN 5-500 MG TABS (HYDROCODONE-ACETAMINOPHEN) 1-2 q 6 hrs as needed pain CEFUROXIME AXETIL 500 MG TABS (CEFUROXIME AXETIL) 1 two times a day Prescriptions: CEFUROXIME AXETIL 500 MG TABS (CEFUROXIME AXETIL) 1 two times a day  #14 x 0   Entered and Authorized by:   Marga Melnick MD   Signed by:   Marga Melnick MD on 01/19/2010   Method used:   Printed then faxed to ...       [facility]  [address] # [id number]* (retail)       [address]       [place], [place]  [address]       Ph: [phone] or [phone]       Fax: [phone]   RxID:   [id number] HYDROCODONE-ACETAMINOPHEN 5-500 MG TABS (HYDROCODONE-ACETAMINOPHEN) 1-2 q 6 hrs as needed pain  #20 x 0   Entered and Authorized by:   Marga Melnick MD   Signed by:   Marga Melnick MD on 01/19/2010   Method used:   Printed then faxed to ...       [facility]  [address] # [id number]* (retail)       [address]       [place], [place]  [address]       Ph: [phone] or [phone]       Fax: [phone]   RxID:   [id number]   Appended Document: Triage: Ongoing Ear Pain pt aware rx sent to pharmacy

## 2010-11-27 NOTE — Assessment & Plan Note (Signed)
Summary: MONTHLY B12 SHOT  Nurse Visit   Allergies: No Known Drug Allergies  Medication Administration  Injection # 1:    Medication: Vit B12 1000 mcg    Diagnosis: B12 DEFICIENCY (ICD-266.2)    Route: IM    Site: R deltoid    Mfr: Radiographer, therapeutic    Patient tolerated injection without complications    Given by: Marisue Humble NCMA (July 18, 2010 11:36 AM)  Orders Added: 1)  Vit B12 1000 mcg [J3420] Dooumented late, on 07-18-10 and don't have the lot number or exp date of the B12 Serum.

## 2010-11-27 NOTE — Assessment & Plan Note (Signed)
Summary: MONTHLY B12 SHOT/JMS  Nurse Visit   Allergies: No Known Drug Allergies  Medication Administration  Injection # 1:    Medication: Vit B12 1000 mcg    Diagnosis: B12 DEFICIENCY (ICD-266.2)    Route: IM    Site: L deltoid    Exp Date: 05/28/2012    Lot #: [id number]    Mfr: [employer]    Comments: Monthly injection    Patient tolerated injection without complications    Given by: June McMurray CMA Duncan Dull) (September 12, 2010 11:44 AM)  Orders Added: 1)  Vit B12 1000 mcg [J3420]   Medication Administration  Injection # 1:    Medication: Vit B12 1000 mcg    Diagnosis: B12 DEFICIENCY (ICD-266.2)    Route: IM    Site: L deltoid    Exp Date: 05/28/2012    Lot #: [id number]    Mfr: [employer]    Comments: Monthly injection    Patient tolerated injection without complications    Given by: June McMurray CMA Duncan Dull) (September 12, 2010 11:44 AM)  Orders Added: 1)  Vit B12 1000 mcg [J3420]

## 2010-11-27 NOTE — Progress Notes (Signed)
Summary: REQUEST FOR REFERRAL  Phone Note Call from Patient Call back at Work Phone [phone] Call back at CELL: [phone]   Caller: Patient Call For: [name] MD Summary of Call: [place]. PATIENT MENTIONED THIS AT LAST OV AND WAS WAITING TO SEE IF IT WOULD GET BETTER BUT NO CHANGE. 02/01/2009, 02/09/2009, 02/10/2009, 02/13/2009,  (NOT AVALIABLE THESE DAYS). WED/ THURSDAY OR FRIDAY AFTERNOON IS BEST AVALIABILITY FOR PATIENT Initial call taken by: Georgette Dover,  January 24, 2009 1:58 PM  Follow-up for Phone Call        [place] Follow-up by: Georgette Dover,  January 24, 2009 2:05 PM

## 2010-11-29 NOTE — Progress Notes (Signed)
Summary: cough med request  Phone Note Call from Patient Call back at [phone]   Summary of Call: Patient left message on triage that he was seen recently and the Prednisone and nasal spray has worked well on all his symptoms except cough. Patient notes that he has an aggravating cough, possible from residual nasal drip and he would like prescription for it. (NKDA) Please advise. Initial call taken by: Ernestene Mention CMA,  November 09, 2010 3:36 PM  Follow-up for Phone Call        Hydromet 120 cc , 1 tsp every 6 hrs as needed  Follow-up by: [name] MD,  November 09, 2010 4:35 PM  Additional Follow-up for Phone Call Additional follow up Details #1::        Patient notified.  Additional Follow-up by: Ernestene Mention CMA,  November 09, 2010 4:39 PM    New/Updated Medications: HYDROMET 5-1.5 MG/5ML SYRP (HYDROCODONE-HOMATROPINE) 1 tsp every 6 hrs as needed Prescriptions: HYDROMET 5-1.5 MG/5ML SYRP (HYDROCODONE-HOMATROPINE) 1 tsp every 6 hrs as needed  #120 cc x 0   Entered and Authorized by:   [name] MD   Signed by:   [name] MD on 11/09/2010   Method used:   Printed then faxed to ...       [facility] # [id number]* (retail)       [address]       [place], [place]  [address]       Ph: [phone] or [phone]       Fax: [phone]   RxID:   [id number]

## 2010-11-29 NOTE — Assessment & Plan Note (Signed)
Summary: yearly f/u--ch.    History of Present Illness Visit Type: Follow-up Visit Primary GI MD: [name] MD [place] Primary Provider: [name], MD  Requesting Provider: n/a Chief Complaint: F/u for Barrett's esophagus and GERD. Pt states he is fine and denies any GI complaints. Pt needs refill on Nexium  History of Present Illness:   Alex [name] is asymptomatic in terms of any acid reflux symptoms or dysphagia. He is on Nexium 40 mg a day for his acid reflux and Barrett's he goes up. Also he denies any gastrointestinal problems with his diverticulosis. He continues with B12 injections monthly.   GI Review of Systems      Denies abdominal pain, acid reflux, belching, bloating, chest pain, dysphagia with liquids, dysphagia with solids, heartburn, loss of appetite, nausea, vomiting, vomiting blood, weight loss, and  weight gain.        Denies anal fissure, black tarry stools, change in bowel habit, constipation, diarrhea, diverticulosis, fecal incontinence, heme positive stool, hemorrhoids, irritable bowel syndrome, jaundice, light color stool, liver problems, rectal bleeding, and  rectal pain.    Current Medications (verified): 1)  Allegra 180 Mg Tabs (Fexofenadine Hcl) .... Take One Tablet Daily As Needed 2)  Nexium 40 Mg Cpdr (Esomeprazole Magnesium) .... Take 1 Capsule By Mouth Once A Day...needs To Make Office Visit. 3)  Cartia Xt 240 Mg  Cp24 (Diltiazem Hcl Coated Beads) .Marland Kitchen.. 1 By Mouth Qd 4)  Hydrochlorothiazide 12.5 Mg  Caps (Hydrochlorothiazide) .... One By Mouth Once Daily 5)  Cyanocobalamin 1000 Mcg/ml Inj Soln (Cyanocobalamin) .Marland Kitchen.. 1 Cc Im Weelky X 3 Weeks The 1 Inj Monthly 6)  Losartan Potassium 100 Mg Tabs (Losartan Potassium) .... 1/2 Once Daily  Allergies (verified): No Known Drug Allergies  Past History:  Past medical, surgical, family and social histories (including risk factors) reviewed for relevance to current acute and chronic problems.  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 Vasectomy EGD  (done periodically since 1990):hiatal hernia, Barrett's, Dr Sharlett Iles (EGD last 2010: stable, due 2013); Colonoscopy negative 2010, due 2020  Family History: Reviewed history from 11/23/2009 and no changes required. Father:Renal CA (died at 5), stomach and throat CA, HTN, bypass surgery @ 37, smoker Mother: HTN Siblings: sister-DM No FH of Colon Cancer  Social History: Reviewed history from 11/23/2009 and no changes required. 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  The patient denies anemia, anxiety-new, arthritis/joint pain, back pain, blood in urine, breast changes/lumps, change in vision, confusion, cough, coughing up blood, depression-new, fainting, fatigue, fever, headaches-new, hearing problems, heart murmur, heart rhythm changes, itching, menstrual pain, muscle pains/cramps, night sweats, nosebleeds, pregnancy symptoms, shortness of breath, skin rash, sleeping problems, sore throat, swelling of feet/legs, swollen lymph glands, thirst - excessive , urination - excessive , urination changes/pain, urine leakage, vision changes, and voice  change.    Vital Signs:  Patient profile:   55 year old male Height:  69.25 inches Weight:      203 pounds BMI:     29.87 BSA:     2.09 Pulse rate:   72 / minute Pulse rhythm:   regular BP sitting:   136 / 82  (left arm) Cuff size:   regular  Vitals Entered By: Hope Pigeon CMA (October 04, 2010 10:41 AM)  Physical Exam  General:  Well developed, well nourished, no acute distress.healthy appearing.   Head:  Normocephalic and atraumatic. Eyes:  PERRLA, no icterus. Lungs:  Clear throughout to auscultation. Heart:  Regular rate and rhythm; no murmurs, rubs,  or bruits. Abdomen:  Soft, nontender and nondistended. No masses, hepatosplenomegaly or hernias noted. Normal bowel sounds. Extremities:  No clubbing, cyanosis, edema or deformities noted. Psych:  Alert and cooperative. Normal mood and affect.   Impression & Recommendations:  Problem # 1:  GERD (ICD-530.81) Assessment Improved Continue reflux regime and daily Nexium therapy. I've advised him to take Caltrate 600 with vitamin D 2 tablets a day per his long-term PPI use.  Problem # 2:  B12 DEFICIENCY (ICD-266.2) Assessment: Improved Continue monthly B12 injections.he refused flu shot administration, and has appointment to see Dr. Linna Darner for yearly checkup shortly. We did not obtain any blood today for review.  Problem # 3:  BARRETT'S ESOPHAGUS, HX OF (ICD-V12.79) Assessment: Unchanged Dysplasia screening with endoscopy in April of 2013. There has been no evidence of dysplasia on his biopsies which have been done on a consistent regular basis.  Patient Instructions: 1)  Your prescription(s) have been sent to you pharmacy.  2)  Copy sent to : [name], MD 3)  Please schedule a follow-up appointment in 1 year. 4)  The medication list was reviewed and reconciled.  All changed / newly prescribed medications were explained.  A complete medication list was provided to the patient / caregiver. 5)  Avoid foods high in  acid content ( tomatoes, citrus juices, spicy foods) . Avoid eating within 3 to 4 hours of lying down or before exercising. Do not over eat; try smaller more frequent meals. Elevate head of bed four inches when sleeping.  6)  Diet should be high in fiber ( fruits, vegetables, whole grains) but low in residue. Drink at least eight (8) glasses of water a day.  Prescriptions: NEXIUM 40 MG CPDR (ESOMEPRAZOLE MAGNESIUM) Take 1 capsule by mouth once a day  #30 x 12   Entered by:   Bernita Buffy CMA ([place])   Authorized by:   Sable Feil MD [facility]   Signed by:   Sable Feil MD [facility] on 10/04/2010   Method used:   Electronically to        [facility] # [id number]* (retail)       [address]       [place],   [address]       Ph: [phone] or [phone]       Fax: [phone]   RxID:   [id number]

## 2010-11-29 NOTE — Assessment & Plan Note (Signed)
Summary: monthly b12 shot.Marland Kitchenam  Nurse Visit   Allergies: No Known Drug Allergies  Medication Administration  Injection # 1:    Medication: Vit B12 1000 mcg    Diagnosis: B12 DEFICIENCY (ICD-266.2)    Route: IM    Site: L deltoid    Exp Date: 10/13    Lot #: [id number]    Mfr: [employer] Regent    Patient tolerated injection without complications    Given by: Lamona Curl CMA (AAMA) (October 17, 2010 11:42 AM)  Orders Added: 1)  Vit B12 1000 mcg [J3420]

## 2010-11-29 NOTE — Assessment & Plan Note (Signed)
Summary: MONTHLY B12 SHOT /JMS  Nurse Visit   Allergies: No Known Drug Allergies  Medication Administration  Injection # 1:    Medication: Vit B12 1000 mcg    Diagnosis: B12 DEFICIENCY (ICD-266.2)    Route: IM    Site: R deltoid    Exp Date: 08/28/2012    Lot #: [id number]    Mfr: [employer]ent    Patient tolerated injection without complications    Given by: June McMurray CMA Duncan Dull) (November 14, 2010 11:46 AM)  Orders Added: 1)  Vit B12 1000 mcg [J3420]   Medication Administration  Injection # 1:    Medication: Vit B12 1000 mcg    Diagnosis: B12 DEFICIENCY (ICD-266.2)    Route: IM    Site: R deltoid    Exp Date: 08/28/2012    Lot #: [id number]    Mfr: [employer]    Patient tolerated injection without complications    Given by: June McMurray CMA Duncan Dull) (November 14, 2010 11:46 AM)  Orders Added: 1)  Vit B12 1000 mcg [J3420]

## 2010-11-29 NOTE — Assessment & Plan Note (Signed)
Summary: earache/headache//lch   Vital Signs:  Patient profile:   55 year old male Weight:      201.2 pounds BMI:     29.60 Temp:     97.9 degrees F oral Pulse rate:   60 / minute Resp:     14 per minute BP sitting:   128 / 90  (left arm) Cuff size:   large  Vitals Entered By: Georgette Dover CMA (November 06, 2010 3:41 PM) CC: C/O: bilateral earache, slight cough, sniffles, fatigue, and headaches , Ear pain   Primary Care Provider:  [name], MD   CC:  C/O: bilateral earache, slight cough, sniffles, fatigue, and headaches , and Ear pain.  History of Present Illness:      This is a 55 year old man who presents with Ear pain.  Symptoms began as frontal headache 01/08 after yard work  followed by ear pain, nasal congestion & hoarseness. The patient denies ear discharge, sensation of fullness, hearing loss, tinnitus, fever, sinus pain, and nasal discharge.  The pain is located in the left  and in the right ear.  The pain is described as constant.   The patient denies dizziness and vertigo.  Patient's history includes hay fever/allergies for which he takes Fexofenadine 180 mg once daily . Rx: Tylenol  Current Medications (verified): 1)  Allegra 180 Mg Tabs (Fexofenadine Hcl) .... Take One Tablet Daily As Needed 2)  Nexium 40 Mg Cpdr (Esomeprazole Magnesium) .... Take 1 Capsule By Mouth Once A Day 3)  Cartia Xt 240 Mg  Cp24 (Diltiazem Hcl Coated Beads) .Marland Kitchen.. 1 By Mouth Qd 4)  Hydrochlorothiazide 12.5 Mg  Caps (Hydrochlorothiazide) .... One By Mouth Once Daily 5)  Cyanocobalamin 1000 Mcg/ml Inj Soln (Cyanocobalamin) .Marland Kitchen.. 1 Cc Im Weelky X 3 Weeks The 1 Inj Monthly 6)  Losartan Potassium 100 Mg Tabs (Losartan Potassium) .... 1/2 Once Daily  Allergies (verified): No Known Drug Allergies  Physical Exam  General:  well-nourished,in no acute distress; alert,appropriate and cooperative throughout examination Ears:  External ear exam shows no significant lesions or deformities.   Minimal  erythema L TM.Hearing is grossly normal bilaterally. Nose:  External nasal examination shows no deformity or inflammation. Nasal mucosa are pink and moist without lesions or exudates. Hyponasal speech Mouth:  Oral mucosa and oropharynx without lesions or exudates.  Teeth in good repair. Lungs:  Normal respiratory effort, chest expands symmetrically. Lungs are clear to auscultation, no crackles or wheezes. Cervical Nodes:  No lymphadenopathy noted Axillary Nodes:  No palpable lymphadenopathy   Impression & Recommendations:  Problem # 1:  UNSPECIFIED OTITIS MEDIA (ICD-382.9)  Complete Medication List: 1)  Allegra 180 Mg Tabs (Fexofenadine hcl) .... Take one tablet daily as needed 2)  Nexium 40 Mg Cpdr (Esomeprazole magnesium) .... Take 1 capsule by mouth once a day 3)  Cartia Xt 240 Mg Cp24 (Diltiazem hcl coated beads) .Marland Kitchen.. 1 by mouth qd 4)  Hydrochlorothiazide 12.5 Mg Caps (Hydrochlorothiazide) .... One by mouth once daily 5)  Cyanocobalamin 1000 Mcg/ml Inj Soln (Cyanocobalamin) .Marland Kitchen.. 1 cc im weelky x 3 weeks the 1 inj monthly 6)  Losartan Potassium 100 Mg Tabs (Losartan potassium) .... 1/2 once daily 7)  Fluticasone Propionate 50 Mcg/act Susp (Fluticasone propionate) .Marland Kitchen.. 1 spray two times a day 8)  Prednisone 20 Mg Tabs (Prednisone) .Marland Kitchen.. 1 two times a day with food  Patient Instructions: 1)  Neti pot once daily - two times a day as needed for head congestion. Report facial pain ,  pus & fever. Prescriptions: PREDNISONE 20 MG TABS (PREDNISONE) 1 two times a day with food  #14 x 0   Entered and Authorized by:   [name] MD   Signed by:   [name] MD on 11/06/2010   Method used:   Electronically to        [facility] # [id number]* (retail)       [address]       [place], [place]  [address]       Ph: [phone] or [phone]       Fax: [phone]   RxID:   [id number] FLUTICASONE PROPIONATE 50 MCG/ACT SUSP (FLUTICASONE PROPIONATE) 1 spray  two times a day  #1 x 5   Entered and Authorized by:   [name] MD   Signed by:   [name] MD on 11/06/2010   Method used:   Electronically to        [facility]. # [id number]* (retail)       [address]       [place], [place]  [address]       Ph: [phone] or [phone]       Fax: [phone]   RxID:   [id number]    Orders Added: 1)  Est. Patient Level III [75423]

## 2010-12-05 ENCOUNTER — Encounter: Payer: Self-pay | Admitting: Internal Medicine

## 2010-12-05 ENCOUNTER — Other Ambulatory Visit: Payer: Self-pay | Admitting: Internal Medicine

## 2010-12-05 ENCOUNTER — Encounter (INDEPENDENT_AMBULATORY_CARE_PROVIDER_SITE_OTHER): Payer: 59 | Admitting: Internal Medicine

## 2010-12-05 DIAGNOSIS — Z0000 Encounter for general adult medical examination without abnormal findings: Secondary | ICD-10-CM

## 2010-12-05 DIAGNOSIS — E785 Hyperlipidemia, unspecified: Secondary | ICD-10-CM

## 2010-12-05 DIAGNOSIS — I10 Essential (primary) hypertension: Secondary | ICD-10-CM

## 2010-12-05 DIAGNOSIS — R109 Unspecified abdominal pain: Secondary | ICD-10-CM

## 2010-12-05 DIAGNOSIS — N400 Enlarged prostate without lower urinary tract symptoms: Secondary | ICD-10-CM

## 2010-12-05 DIAGNOSIS — E538 Deficiency of other specified B group vitamins: Secondary | ICD-10-CM

## 2010-12-05 DIAGNOSIS — Z8719 Personal history of other diseases of the digestive system: Secondary | ICD-10-CM

## 2010-12-05 LAB — BASIC METABOLIC PANEL
BUN: 18 mg/dL (ref 6–23)
CO2: 26 mEq/L (ref 19–32)
Calcium: 9 mg/dL (ref 8.4–10.5)
Chloride: 105 mEq/L (ref 96–112)
Creatinine, Ser: 1 mg/dL (ref 0.4–1.5)
GFR: 80.12 mL/min (ref >60.00)
Glucose, Bld: 96 mg/dL (ref 70–99)
Potassium: 3.8 mEq/L (ref 3.5–5.1)
Sodium: 142 mEq/L (ref 135–145)

## 2010-12-05 LAB — CBC WITH DIFFERENTIAL/PLATELET
Basophils Absolute: 0 10*3/uL (ref 0.0–0.1)
Basophils Relative: 0.7 % (ref 0.0–3.0)
Eosinophils Absolute: 0.1 10*3/uL (ref 0.0–0.7)
Eosinophils Relative: 3.3 % (ref 0.0–5.0)
HCT: 46 % (ref 39.0–52.0)
Hemoglobin: 16.2 g/dL (ref 13.0–17.0)
Lymphocytes Relative: 27.7 % (ref 12.0–46.0)
Lymphs Abs: 1.2 10*3/uL (ref 0.7–4.0)
MCHC: 35.3 g/dL (ref 30.0–36.0)
MCV: 94.1 fl (ref 78.0–100.0)
Monocytes Absolute: 0.5 10*3/uL (ref 0.1–1.0)
Monocytes Relative: 10.7 % (ref 3.0–12.0)
Neutro Abs: 2.5 10*3/uL (ref 1.4–7.7)
Neutrophils Relative %: 57.6 % (ref 43.0–77.0)
Platelets: 182 10*3/uL (ref 150.0–400.0)
RBC: 4.89 Mil/uL (ref 4.22–5.81)
RDW: 12.5 % (ref 11.5–14.6)
WBC: 4.4 10*3/uL — ABNORMAL LOW (ref 4.5–10.5)

## 2010-12-05 LAB — HEPATIC FUNCTION PANEL
ALT: 56 U/L — ABNORMAL HIGH (ref 0–53)
AST: 32 U/L (ref 0–37)
Albumin: 4.2 g/dL (ref 3.5–5.2)
Alkaline Phosphatase: 50 U/L (ref 39–117)
Bilirubin, Direct: 0.2 mg/dL (ref 0.0–0.3)
Total Bilirubin: 0.7 mg/dL (ref 0.3–1.2)
Total Protein: 7.1 g/dL (ref 6.0–8.3)

## 2010-12-05 LAB — CONVERTED CEMR LAB: LDL Goal: 115 mg/dL

## 2010-12-05 LAB — LIPID PANEL
Cholesterol: 203 mg/dL — ABNORMAL HIGH (ref 0–200)
HDL: 41.7 mg/dL (ref >39.00)
Total CHOL/HDL Ratio: 5
Triglycerides: 82 mg/dL (ref 0.0–149.0)
VLDL: 16.4 mg/dL (ref 0.0–40.0)

## 2010-12-05 LAB — LDL CHOLESTEROL, DIRECT: Direct LDL: 146.5 mg/dL

## 2010-12-05 LAB — PSA: PSA: 0.73 ng/mL (ref 0.10–4.00)

## 2010-12-05 LAB — TSH: TSH: 1.99 u[IU]/mL (ref 0.35–5.50)

## 2010-12-05 LAB — VITAMIN B12: Vitamin B-12: 410 pg/mL (ref 211–911)

## 2010-12-12 ENCOUNTER — Encounter: Payer: Self-pay | Admitting: Gastroenterology

## 2010-12-12 ENCOUNTER — Encounter (INDEPENDENT_AMBULATORY_CARE_PROVIDER_SITE_OTHER): Payer: 59

## 2010-12-12 DIAGNOSIS — E538 Deficiency of other specified B group vitamins: Secondary | ICD-10-CM

## 2010-12-13 NOTE — Assessment & Plan Note (Signed)
Summary: cpx/labs/kn   Vital Signs:  Patient profile:   55 year old male Height:      70.5 inches Weight:      202.4 pounds BMI:     28.73 Temp:     97.9 degrees F oral Pulse rate:   64 / minute Resp:     14 per minute BP sitting:   130 / 90  (left arm) Cuff size:   large  Vitals Entered By: Shonna Chock CMA (December 05, 2010 8:38 AM)  Primary Care Provider:  Marga Melnick, MD   CC:  Abdominal pain.  History of Present Illness:    Alex [name] is here for a physical; he has had intermittent L lateral , mid abdominal pain < 1 month. He  denies nausea, vomiting, diarrhea, constipation, melena, hematochezia, anorexia, and hematemesis.  The pain is described as dull and non  radiating . The patient denies the following symptoms: fever, weight loss, dysuria, chest pain, jaundice, and dark urine.  The pain may have been caused by twisting his torso  @ work.       The patient also presents for Hypertension follow-up.  The patient denies lightheadedness, urinary frequency, headaches, edema, and fatigue.  The patient denies the following associated symptoms: exercise intolerance, dyspnea, palpitations, and syncope.  The patient reports that dietary compliance has been good.  The patient reports exercising 1X/week.  Adjunctive measures currently used by the patient include salt restriction.   BP  not monitored @ home.  Lipid Management History:      Positive NCEP/ATP III risk factors include male age 29 years old or older, HDL cholesterol less than 40, and hypertension.  Negative NCEP/ATP III risk factors include non-diabetic, no family history for ischemic heart disease, non-tobacco-user status, no ASHD (atherosclerotic heart disease), no prior stroke/TIA, no peripheral vascular disease, and no history of aortic aneurysm.     Current Medications (verified): 1)  Allegra 180 Mg Tabs (Fexofenadine Hcl) .... Take One Tablet Daily As Needed 2)  Nexium 40 Mg Cpdr (Esomeprazole Magnesium) .... Take  1 Capsule By Mouth Once A Day 3)  Cartia Xt 240 Mg  Cp24 (Diltiazem Hcl Coated Beads) .Marland Kitchen.. 1 By Mouth Qd 4)  Hydrochlorothiazide 12.5 Mg  Caps (Hydrochlorothiazide) .... One By Mouth Once Daily 5)  Cyanocobalamin 1000 Mcg/ml Inj Soln (Cyanocobalamin) .Marland Kitchen.. 1 Cc Im Weelky X 3 Weeks The 1 Inj Monthly 6)  Losartan Potassium 100 Mg Tabs (Losartan Potassium) .... 1/2 Once Daily  Allergies: No Known Drug Allergies  Past History:  Past Medical History: GILBERT'S SYNDROME (ICD-277.4) NONSPEC ELEVATION OF LEVELS OF TRANSAMINASE/LDH (ICD-790.4) HYPERLIPIDEMIA (ICD-272.4): NMR Lipoprofie 2005: LDL 134 (1590/837), HDL 38, TG 63. LDL goal = < 115. Framingham Study LDL goal = < 130. 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) HIATAL HERNIA (ICD-553.3) Hx of LIPOMA (ICD-214.9) BARRETT'S ESOPHAGUS, HX OF (ICD-V12.79) ESOPHAGEAL REFLUX (ICD-530.81)  Past Surgical History: Vasectomy EGD  (done periodically since 1990):hiatal hernia, Barrett's Esophagus , Dr Jarold Motto (EGD last 2010: stable, due 2013); Colonoscopy negative 2010, due 2020  Family History: Father:Renal  cancer (died at 47), stomach and throat  cancer, HTN, bypass surgery @ 27, smoker Mother: HTN Siblings: sister:DM No FH of Colon Cancer  Social History: Occupation: Psychologist, 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    Physical Exam  General:  well-nourished,in no acute distress; alert,appropriate and cooperative throughout examination Head:   Normocephalic and atraumatic without obvious abnormalities.  Eyes:  No corneal or conjunctival inflammation noted.Marland Kitchen Perrla. Funduscopic exam benign, without hemorrhages, exudates or papilledema.No icterus Ears:  External ear exam shows no significant lesions or deformities.  Otoscopic examination reveals clear canals, tympanic membranes are intact bilaterally without bulging, retraction, inflammation or discharge. Hearing is grossly normal bilaterally. Nose:  External nasal examination shows no deformity or inflammation. Nasal mucosa are pink and moist without lesions or exudates. Mouth:  Oral mucosa and oropharynx without lesions or exudates.  Teeth in good repair. Minimal pharyngeal erythema.   Neck:  No deformities, masses, or tenderness noted. Lungs:  Normal respiratory effort, chest expands symmetrically. Lungs are clear to auscultation, no crackles or wheezes. Heart:  Normal rate and regular rhythm. S1 and S2 normal without gallop, murmur, click, rub . S4 Abdomen:  Bowel sounds positive,abdomen soft and non-tender ( even to percussion)  without masses, organomegaly or hernias noted. Small Lipoma L mid abdomen Rectal:  No external abnormalities noted. Normal sphincter tone. No rectal masses or tenderness. Genitalia:  Testes bilaterally descended without nodularity, tenderness or masses. No scrotal masses or lesions. No penis lesions or urethral discharge.L varicocele.   Prostate:  Prostate gland firm , broad and smooth, no enlargement, nodularity, tenderness, mass, asymmetry or induration. Msk:  No deformity or scoliosis noted of thoracic or lumbar spine.   Pulses:  R and L carotid,radial,dorsalis pedis and posterior tibial pulses are full and equal bilaterally Extremities:  No clubbing, cyanosis, edema, or deformity noted with normal full range of motion of all joints.   Neurologic:  alert & oriented X3 and DTRs symmetrical and normal.   Skin:  Intact without suspicious lesions or rashes. No  jaundice Cervical Nodes:  No lymphadenopathy noted Axillary Nodes:  No palpable lymphadenopathy Inguinal Nodes:  No significant adenopathy Psych:  memory intact for recent and remote, normally interactive, and good eye contact.     Impression & Recommendations:  Problem # 1:  ROUTINE GENERAL MEDICAL EXAM@HEALTH  CARE FACL (ICD-V70.0)  Orders: EKG w/ Interpretation (93000) Venipuncture (30865) TLB-Lipid Panel (80061-LIPID) TLB-BMP (Basic Metabolic Panel-BMET) (80048-METABOL) TLB-CBC Platelet - w/Differential (85025-CBCD) TLB-Hepatic/Liver Function Pnl (80076-HEPATIC) TLB-TSH (Thyroid Stimulating Hormone) (84443-TSH) TLB-B12, Serum-Total ONLY (78469-G29) TLB-PSA (Prostate Specific Antigen) (84153-PSA)  Problem # 2:  ABDOMINAL PAIN, UNSPECIFIED SITE (ICD-789.00)  Probable abdominal wall muscle strain  Problem # 3:  HYPERTENSION, ESSENTIAL NOS (ICD-401.9)  His updated medication list for this problem includes:    Cartia Xt 240 Mg Cp24 (Diltiazem hcl coated beads) .Marland Kitchen... 1 by mouth qd    Hydrochlorothiazide 12.5 Mg Caps (Hydrochlorothiazide) ..... One by mouth once daily    Losartan Potassium 100 Mg Tabs (Losartan potassium) .Marland Kitchen... 1/2 once daily  Problem # 4:  B12 DEFICIENCY (ICD-266.2)  Problem # 5:  HYPERPLASIA PROSTATE UNS W/O UR OBST & OTH LUTS (ICD-600.90)  Problem # 6:  BARRETT'S ESOPHAGUS, HX OF (ICD-V12.79)  Complete Medication List: 1)  Allegra 180 Mg Tabs (Fexofenadine hcl) .... Take one tablet daily as needed 2)  Nexium 40 Mg Cpdr (Esomeprazole magnesium) .... Take 1 capsule by mouth once a day 3)  Cartia Xt 240 Mg Cp24 (Diltiazem hcl coated beads) .Marland Kitchen.. 1 by mouth qd 4)  Hydrochlorothiazide 12.5 Mg Caps (Hydrochlorothiazide) .... One by  mouth once daily 5)  Cyanocobalamin 1000 Mcg/ml Inj Soln (Cyanocobalamin) .Marland Kitchen.. 1 cc im weelky x 3 weeks the 1 inj monthly 6)  Losartan Potassium 100 Mg Tabs (Losartan potassium) .... 1/2 once daily  Lipid Assessment/Plan:      Based  on NCEP/ATP III, the patient's risk factor category is "2 or more risk factors and a calculated 10 year CAD risk of < 20%".  The patient's lipid goals are as follows: Total cholesterol goal is 200; LDL cholesterol goal is 115; HDL cholesterol goal is 40; Triglyceride goal is 150.  His LDL cholesterol goal has not been met.  Secondary causes for hyperlipidemia have been ruled out.  He has been counseled on adjunctive measures for lowering his cholesterol and has been provided with dietary instructions.    Patient Instructions: 1)  Zostrix cream three times a day as needed or heating pad  to abdominal area of pain. 2)  Check your Blood Pressure regularly. If it is above: 135/85 ON AVERAGE  you should make an appointment. Prescriptions: HYDROCHLOROTHIAZIDE 12.5 MG  CAPS (HYDROCHLOROTHIAZIDE) one by mouth once daily  #90 x 3   Entered and Authorized by:   Marga Melnick MD   Signed by:   Marga Melnick MD on 12/05/2010   Method used:   Electronically to        [id number]* (retail)       [address]       [place], [place]  [address]       Ph: [phone] or [phone]       Fax: [phone]   RxID:   [id number] CARTIA XT 240 MG  CP24 (DILTIAZEM HCL COATED BEADS) 1 by mouth qd  #90 x 3   Entered and Authorized by:   Marga Melnick MD   Signed by:   Marga Melnick MD on 12/05/2010   Method used:   Electronically to        [facility] # [id number]* (retail)       [address]       [place], [place]  [address]       Ph: [phone] or [phone]       Fax: [phone]   RxID:   [id number]    Orders Added: 1)  Est. Patient 40-64 years [99396] 2)  EKG w/ Interpretation [93000] 3)  Venipuncture [36415] 4)  TLB-Lipid Panel [80061-LIPID] 5)  TLB-BMP (Basic Metabolic Panel-BMET) [80048-METABOL] 6)  TLB-CBC Platelet - w/Differential [85025-CBCD] 7)  TLB-Hepatic/Liver Function Pnl [80076-HEPATIC] 8)  TLB-TSH  (Thyroid Stimulating Hormone) [84443-TSH] 9)  TLB-B12, Serum-Total ONLY [82607-B12] 10)  TLB-PSA (Prostate Specific Antigen) [57322-GUR]  Appended Document: cpx/labs/kn Alex [name]. Buller had a physical  exam 12/05/2010.   Appended Document: cpx/labs/kn

## 2010-12-19 NOTE — Assessment & Plan Note (Signed)
Summary: MONTHLY B12 SHOT..LSW  Nurse Visit   Allergies: No Known Drug Allergies  Medication Administration  Injection # 1:    Medication: Vit B12 1000 mcg    Diagnosis: B12 DEFICIENCY (ICD-266.2)    Route: IM    Site: L deltoid    Exp Date: 08/2012    Lot #: [id number]    Mfr: American Regent    Patient tolerated injection without complications    Given by: Milford Cage NCMA (December 12, 2010 11:48 AM)  Orders Added: 1)  Vit B12 1000 mcg [J3420]

## 2011-01-16 ENCOUNTER — Ambulatory Visit (INDEPENDENT_AMBULATORY_CARE_PROVIDER_SITE_OTHER): Payer: BC Managed Care – PPO | Admitting: Gastroenterology

## 2011-01-16 DIAGNOSIS — E538 Deficiency of other specified B group vitamins: Secondary | ICD-10-CM

## 2011-01-16 MED ORDER — CYANOCOBALAMIN 1000 MCG/ML IJ SOLN
1000.0000 ug | INTRAMUSCULAR | Status: AC
  Administered 2011-01-16 – 2011-06-28 (×6): 1000 ug via INTRAMUSCULAR

## 2011-02-20 ENCOUNTER — Ambulatory Visit (INDEPENDENT_AMBULATORY_CARE_PROVIDER_SITE_OTHER): Payer: BC Managed Care – PPO | Admitting: Gastroenterology

## 2011-02-20 DIAGNOSIS — E538 Deficiency of other specified B group vitamins: Secondary | ICD-10-CM

## 2011-03-15 NOTE — Assessment & Plan Note (Signed)
 HEALTHCARE                           GASTROENTEROLOGY OFFICE NOTE   NAME:[name], Alex RUBI                   MRN:          [id number]  DATE:08/06/2006                            DOB:          08-30-1956    Trayce is asymptomatic on daily Nexium. Biopsies from January showed no  evidence of dysplasia with his chronic Barrett's mucosa. He is scheduled for  follow-up on three years. He will turn 51 next July and I have scheduled him  for screening colonoscopy at that time. 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 The patient has not had liver biopsy and this may need to be  considered, although I think the chance of this showing any treatable liver  condition are unlikely. He has had slightly decreased serum ceruloplasmin  level, but 24-hour urine copper examinations were all normal as were other  liver work-up screening parameters.       Loralee Pacas. Sharlett Iles, MD, [place]val Regal, [place]      DRP/MedQ  DD:  08/06/2006  DT:  08/07/2006  Job #:  [id number]   cc:   Darrick Penna. Linna Darner, MD,FACP,FCCP

## 2011-03-15 NOTE — Assessment & Plan Note (Signed)
[facility] OFFICE NOTE   NAME:Kerner, JUJHAR EVERETT                   MRN:          [id number]  DATE:02/23/2007                            DOB:          Mar 26, 1956    Alex [name]. Fawbush, date of birth July 05, 1956, was seen February 23, 2007 to evaluate a protuberance over the left abdomen associated with  some tenderness.  He questions whether it might be worse after eating.   He has a long history of esophageal reflux since 1990 & is followed by  Dr. [name].  His most recent endoscopy in January 2007 revealed  a hiatal hernia and Barrett's esophagus.  He believes he will have a  colonoscopy in July of 2008.   He has no cardiopulmonary symptoms.  Additionally denies headaches or  nosebleeds.   He has had no melena, or rectal bleeding, or dysphagia.   He is on Diltiazem 240 mg daily for blood pressure.  His blood pressure  this morning was 138/87 prior to taking his medicines.  In the office it  was 176/100.  He states that he does have some anxiety here in the  office.   He previously was Rxed Altace;but  this was not initiated because he did  have  urticaria immediately BEFORE it was to be started.  At this time weight is essentially stable at 196, blood pressure was  176/100, pulse was 80 and regular, respiratory rate was 15.  He has  arterial narrowing on fundal exam; there is no palpable edema.  He has  no carotid bruits.  CHEST:  Clear to auscultation.  He has a grade 1/2 systolic murmur  versus S4 with slurring.  There is no aortic aneurysm.  He has no organomegaly or masses.  No ventral or abdominal hernias could be noted.  He does have a 1 x 1.5  centimeter lipoma in the left lower quadrant which transilluminates.  He  localizes this as the lesion of concern.  He was reassured considering the lipoma.  As a prelude to the  colonoscopy in July of 2008 I will ask him to complete stool  cards.  If  any of these are positive then I would certainly schedule a colonoscopy  prior to that screening date.   Clonidine will be added to the Diltiazem because of the uncontrolled  blood pressure.  It should average less than 130/85.  He has been asked  to bring the blood pressure cuff to each office visit.     Darrick Penna. Linna Darner, MD,FACP,FCCP  Electronically Signed    [facility]/MedQ  DD: 02/23/2007  DT: 02/23/2007  Job #: [id number]   cc:   Loralee Pacas. Sharlett Iles, MD, Quentin Ore, [place]

## 2011-03-22 ENCOUNTER — Ambulatory Visit (INDEPENDENT_AMBULATORY_CARE_PROVIDER_SITE_OTHER): Payer: BC Managed Care – PPO | Admitting: Gastroenterology

## 2011-03-22 DIAGNOSIS — E538 Deficiency of other specified B group vitamins: Secondary | ICD-10-CM

## 2011-03-22 NOTE — Progress Notes (Signed)
Patient tolerated well.

## 2011-04-09 ENCOUNTER — Other Ambulatory Visit: Payer: Self-pay | Admitting: Internal Medicine

## 2011-04-24 ENCOUNTER — Ambulatory Visit (INDEPENDENT_AMBULATORY_CARE_PROVIDER_SITE_OTHER): Payer: BC Managed Care – PPO | Admitting: Gastroenterology

## 2011-04-24 DIAGNOSIS — E538 Deficiency of other specified B group vitamins: Secondary | ICD-10-CM

## 2011-05-24 ENCOUNTER — Ambulatory Visit (INDEPENDENT_AMBULATORY_CARE_PROVIDER_SITE_OTHER): Payer: BC Managed Care – PPO | Admitting: Gastroenterology

## 2011-05-24 DIAGNOSIS — E538 Deficiency of other specified B group vitamins: Secondary | ICD-10-CM

## 2011-06-28 ENCOUNTER — Ambulatory Visit (INDEPENDENT_AMBULATORY_CARE_PROVIDER_SITE_OTHER): Payer: BC Managed Care – PPO | Admitting: Gastroenterology

## 2011-06-28 DIAGNOSIS — E538 Deficiency of other specified B group vitamins: Secondary | ICD-10-CM

## 2011-07-31 ENCOUNTER — Ambulatory Visit (INDEPENDENT_AMBULATORY_CARE_PROVIDER_SITE_OTHER): Payer: BC Managed Care – PPO | Admitting: Gastroenterology

## 2011-07-31 DIAGNOSIS — E538 Deficiency of other specified B group vitamins: Secondary | ICD-10-CM

## 2011-07-31 MED ORDER — CYANOCOBALAMIN 1000 MCG/ML IJ SOLN
1000.0000 ug | INTRAMUSCULAR | Status: AC
  Administered 2011-07-31 – 2011-09-04 (×3): 1000 ug via INTRAMUSCULAR

## 2011-09-04 ENCOUNTER — Ambulatory Visit (INDEPENDENT_AMBULATORY_CARE_PROVIDER_SITE_OTHER): Payer: BC Managed Care – PPO | Admitting: Gastroenterology

## 2011-09-04 DIAGNOSIS — E538 Deficiency of other specified B group vitamins: Secondary | ICD-10-CM

## 2011-09-04 NOTE — Progress Notes (Signed)
b12

## 2011-09-06 ENCOUNTER — Encounter: Payer: Self-pay | Admitting: Gastroenterology

## 2011-10-09 ENCOUNTER — Ambulatory Visit: Payer: BC Managed Care – PPO | Admitting: Gastroenterology

## 2011-10-09 DIAGNOSIS — E538 Deficiency of other specified B group vitamins: Secondary | ICD-10-CM

## 2011-10-09 MED ORDER — CYANOCOBALAMIN 1000 MCG/ML IJ SOLN
1000.0000 ug | INTRAMUSCULAR | Status: DC
  Administered 2011-10-09 – 2014-08-03 (×14): 1000 ug via INTRAMUSCULAR

## 2011-10-10 ENCOUNTER — Encounter: Payer: Self-pay | Admitting: Gastroenterology

## 2011-10-10 ENCOUNTER — Ambulatory Visit (INDEPENDENT_AMBULATORY_CARE_PROVIDER_SITE_OTHER): Payer: BC Managed Care – PPO | Admitting: Gastroenterology

## 2011-10-10 ENCOUNTER — Ambulatory Visit: Payer: BC Managed Care – PPO | Admitting: Gastroenterology

## 2011-10-10 VITALS — BP 128/72 | HR 78 | Ht 71.0 in | Wt 202.0 lb

## 2011-10-10 DIAGNOSIS — K5730 Diverticulosis of large intestine without perforation or abscess without bleeding: Secondary | ICD-10-CM | POA: Insufficient documentation

## 2011-10-10 DIAGNOSIS — K219 Gastro-esophageal reflux disease without esophagitis: Secondary | ICD-10-CM

## 2011-10-10 DIAGNOSIS — R1032 Left lower quadrant pain: Secondary | ICD-10-CM

## 2011-10-10 DIAGNOSIS — R7401 Elevation of levels of liver transaminase levels: Secondary | ICD-10-CM

## 2011-10-10 DIAGNOSIS — K2270 Barrett's esophagus without dysplasia: Secondary | ICD-10-CM

## 2011-10-10 DIAGNOSIS — R748 Abnormal levels of other serum enzymes: Secondary | ICD-10-CM

## 2011-10-10 MED ORDER — HYOSCYAMINE SULFATE 0.125 MG SL SUBL: 0.1250 mg | SUBLINGUAL_TABLET | Freq: Three times a day (TID) | SUBLINGUAL | Status: AC

## 2011-10-10 MED ORDER — CIPROFLOXACIN HCL 500 MG PO TABS: 500.0000 mg | ORAL_TABLET | Freq: Two times a day (BID) | ORAL | Status: AC

## 2011-10-10 NOTE — Patient Instructions (Signed)
Your abdominal ultrasound is schedule for 10/16/2011 arrive at 8:15am at [facility] radiology nothing to eat or drink after midnight.  Your prescription(s) have been sent to you pharmacy.

## 2011-10-10 NOTE — Progress Notes (Signed)
This is a 55 year old Caucasian male with chronic GERD and known Barrett's mucosa without dysplasia on serial exams and biopsies. He currently is asymptomatic on Nexium 40 mg a day. He also has had mildly abnormal liver function tests of unexplained etiology. There is no history of known liver disease or gallbladder disease in his family. The patient does not smoke or use alcohol. He does have diverticulosis noted on colonoscopy, and he currently has some left lower quadrant discomfort with gas and bloating. He denies fever, chills, melena or hematochezia or any specific hepatobiliary complaints. He also has B12  deficiency and is on parenteral replacement therapy.  Current Medications, Allergies, Past Medical History, Past Surgical History, Family History and Social History were reviewed in [facility] record.  Pertinent Review of Systems Negative   Physical Exam: Awake and alert no acute distress appearing his stated age. Chest is clear, he is in a regular rhythm without murmurs gallops or rubs. There is no organomegaly, abdominal masses or tenderness except in the left lower quadrant to deep palpation. There is no rebound tenderness. Bowel sounds are normal. Peripheral extremities are unremarkable her mental status is normal.   Assessment and Plan: Chronic GERD doing well on daily PPI therapy. This appears to have subacute diverticulitis and I have placed him on Cipro 500 mg twice a day for week with when necessary Levsin 0.125 mg every 6-8 hours. I have scheduled upper abdominal ultrasound exam to evaluate his hepatobiliary area. He does have a history of  Gilbert's syndrome which may explain his mild liver function abnormalities. He certainly has no physical exam evidence of chronic liver disease with symptoms of chronic liver disease. Encounter Diagnoses  Name Primary?  . Increased liver enzymes Yes  . GERD (gastroesophageal reflux disease)   . Barrett esophagus   .  Abdominal pain, LLQ   . Diverticulosis of colon (without mention of hemorrhage)

## 2011-10-16 ENCOUNTER — Ambulatory Visit (HOSPITAL_COMMUNITY)
Admission: RE | Admit: 2011-10-16 | Discharge: 2011-10-16 | Disposition: A | Discharge status: Home / Self Care | Payer: BC Managed Care – PPO | Source: Ambulatory Visit | Attending: Gastroenterology | Admitting: Gastroenterology

## 2011-10-16 DIAGNOSIS — K5730 Diverticulosis of large intestine without perforation or abscess without bleeding: Secondary | ICD-10-CM | POA: Insufficient documentation

## 2011-10-16 DIAGNOSIS — R1032 Left lower quadrant pain: Secondary | ICD-10-CM | POA: Insufficient documentation

## 2011-10-16 DIAGNOSIS — R7401 Elevation of levels of liver transaminase levels: Secondary | ICD-10-CM | POA: Insufficient documentation

## 2011-10-16 DIAGNOSIS — R748 Abnormal levels of other serum enzymes: Secondary | ICD-10-CM

## 2011-10-16 DIAGNOSIS — K2270 Barrett's esophagus without dysplasia: Secondary | ICD-10-CM | POA: Insufficient documentation

## 2011-10-16 DIAGNOSIS — K760 Fatty (change of) liver, not elsewhere classified: Secondary | ICD-10-CM

## 2011-10-16 DIAGNOSIS — K219 Gastro-esophageal reflux disease without esophagitis: Secondary | ICD-10-CM | POA: Insufficient documentation

## 2011-10-16 DIAGNOSIS — R7402 Elevation of levels of lactic acid dehydrogenase (LDH): Secondary | ICD-10-CM | POA: Insufficient documentation

## 2011-10-16 HISTORY — DX: Fatty (change of) liver, not elsewhere classified: K76.0

## 2011-10-25 ENCOUNTER — Telehealth: Payer: Self-pay | Admitting: Gastroenterology

## 2011-10-25 NOTE — Telephone Encounter (Signed)
Informed pt I cannot interpret the U/S results and Dr Jarold Motto will be back Monday. Pt stated understanding and stated we can call him on New Year's Eve or on 10/30/11. Dr Jarold Motto, please advise on U/S on 10/10/11. Thanks.

## 2011-10-26 ENCOUNTER — Other Ambulatory Visit: Payer: Self-pay | Admitting: Gastroenterology

## 2011-10-28 NOTE — Telephone Encounter (Signed)
Notified pt of Dr Norval Gable finding and reminder sent to me to call him for liver enzyme lab next December; pt stated understanding.

## 2011-10-28 NOTE — Telephone Encounter (Signed)
Changes of fatty liver, no gallstones, and liver test are fine please call him. We will check his liver enzymes yearly.

## 2011-11-11 ENCOUNTER — Ambulatory Visit (INDEPENDENT_AMBULATORY_CARE_PROVIDER_SITE_OTHER): Payer: BC Managed Care – PPO | Admitting: Gastroenterology

## 2011-11-11 DIAGNOSIS — E538 Deficiency of other specified B group vitamins: Secondary | ICD-10-CM

## 2011-12-08 ENCOUNTER — Other Ambulatory Visit: Payer: Self-pay | Admitting: Internal Medicine

## 2011-12-11 ENCOUNTER — Ambulatory Visit (INDEPENDENT_AMBULATORY_CARE_PROVIDER_SITE_OTHER): Payer: BC Managed Care – PPO | Admitting: Internal Medicine

## 2011-12-11 ENCOUNTER — Encounter: Payer: Self-pay | Admitting: Internal Medicine

## 2011-12-11 VITALS — BP 142/90 | HR 84 | Temp 97.9°F | Resp 12 | Ht 70.0 in | Wt 203.0 lb

## 2011-12-11 DIAGNOSIS — Z0000 Encounter for general adult medical examination without abnormal findings: Secondary | ICD-10-CM

## 2011-12-11 DIAGNOSIS — E538 Deficiency of other specified B group vitamins: Secondary | ICD-10-CM

## 2011-12-11 DIAGNOSIS — N400 Enlarged prostate without lower urinary tract symptoms: Secondary | ICD-10-CM

## 2011-12-11 DIAGNOSIS — I10 Essential (primary) hypertension: Secondary | ICD-10-CM

## 2011-12-11 DIAGNOSIS — E785 Hyperlipidemia, unspecified: Secondary | ICD-10-CM

## 2011-12-11 LAB — LIPID PANEL
Cholesterol: 196 mg/dL (ref 0–200)
HDL: 42.9 mg/dL (ref >39.00)
LDL Cholesterol: 140 mg/dL — ABNORMAL HIGH (ref 0–99)
Total CHOL/HDL Ratio: 5
Triglycerides: 66 mg/dL (ref 0.0–149.0)
VLDL: 13.2 mg/dL (ref 0.0–40.0)

## 2011-12-11 LAB — BASIC METABOLIC PANEL
BUN: 14 mg/dL (ref 6–23)
CO2: 27 mEq/L (ref 19–32)
Calcium: 8.9 mg/dL (ref 8.4–10.5)
Chloride: 103 mEq/L (ref 96–112)
Creatinine, Ser: 0.9 mg/dL (ref 0.4–1.5)
GFR: 90.93 mL/min (ref >60.00)
Glucose, Bld: 107 mg/dL — ABNORMAL HIGH (ref 70–99)
Potassium: 4 mEq/L (ref 3.5–5.1)
Sodium: 137 mEq/L (ref 135–145)

## 2011-12-11 LAB — CBC WITH DIFFERENTIAL/PLATELET
Basophils Absolute: 0 10*3/uL (ref 0.0–0.1)
Basophils Relative: 1.1 % (ref 0.0–3.0)
Eosinophils Absolute: 0.1 10*3/uL (ref 0.0–0.7)
Eosinophils Relative: 2.4 % (ref 0.0–5.0)
HCT: 47.5 % (ref 39.0–52.0)
Hemoglobin: 16.4 g/dL (ref 13.0–17.0)
Lymphocytes Relative: 31.8 % (ref 12.0–46.0)
Lymphs Abs: 1.2 10*3/uL (ref 0.7–4.0)
MCHC: 34.5 g/dL (ref 30.0–36.0)
MCV: 93 fl (ref 78.0–100.0)
Monocytes Absolute: 0.4 10*3/uL (ref 0.1–1.0)
Monocytes Relative: 10 % (ref 3.0–12.0)
Neutro Abs: 2.1 10*3/uL (ref 1.4–7.7)
Neutrophils Relative %: 54.7 % (ref 43.0–77.0)
Platelets: 163 10*3/uL (ref 150.0–400.0)
RBC: 5.1 Mil/uL (ref 4.22–5.81)
RDW: 12.7 % (ref 11.5–14.6)
WBC: 3.8 10*3/uL — ABNORMAL LOW (ref 4.5–10.5)

## 2011-12-11 LAB — HEPATIC FUNCTION PANEL
ALT: 40 U/L (ref 0–53)
AST: 25 U/L (ref 0–37)
Albumin: 4.2 g/dL (ref 3.5–5.2)
Alkaline Phosphatase: 52 U/L (ref 39–117)
Bilirubin, Direct: 0.1 mg/dL (ref 0.0–0.3)
Total Bilirubin: 1 mg/dL (ref 0.3–1.2)
Total Protein: 7.1 g/dL (ref 6.0–8.3)

## 2011-12-11 LAB — PSA: PSA: 0.61 ng/mL (ref 0.10–4.00)

## 2011-12-11 LAB — VITAMIN B12: Vitamin B-12: 355 pg/mL (ref 211–911)

## 2011-12-11 LAB — TSH: TSH: 1.93 u[IU]/mL (ref 0.35–5.50)

## 2011-12-11 MED ORDER — DILTIAZEM HCL ER 240 MG PO CP24: 240.0000 mg | ORAL_CAPSULE | Freq: Every day | ORAL | Status: DC

## 2011-12-11 NOTE — Progress Notes (Signed)
  Subjective:    Patient ID: Alex [name], male    DOB: 1956-05-10, 56 y.o.   MRN: [id number]  HPI  Alex [name] is here for a physical;he denies acute issues .      Review of Systems Patient reports no  vision/ hearing changes,anorexia, weight change, fever ,adenopathy, persistant / recurrent hoarseness, swallowing issues, chest pain,palpitations, edema,persistant / recurrent cough, hemoptysis, dyspnea(rest, exertional, paroxysmal nocturnal), gastrointestinal  bleeding (melena, rectal bleeding), abdominal pain, excessive heart burn, GU symptoms( dysuria, hematuria, pyuria, voiding/incontinence  issues) syncope, focal weakness, memory loss,numbness & tingling, skin/hair/nail changes,depression, anxiety, abnormal bruising/bleeding, musculoskeletal symptoms/signs.      Objective:   Physical Exam Gen.: Healthy and well-nourished in appearance. Alert, appropriate and cooperative throughout exam. Head: Normocephalic without obvious abnormalities Eyes: No corneal or conjunctival inflammation noted. Pupils equal round reactive to light and accommodation. Fundal exam is benign without hemorrhages, exudate, papilledema. Extraocular motion intact. Vision grossly normal with lenses. Ears: External  ear exam reveals no significant lesions or deformities. Canals clear .TMs normal. Hearing is grossly normal bilaterally. Nose: External nasal exam reveals no deformity or inflammation. Nasal mucosa are pink and moist. No lesions or exudates noted.   Mouth: Oral mucosa and oropharynx reveal no lesions or exudates. Teeth in good repair.   Oropharyngeal crowding Neck: No deformities, masses, or tenderness noted. Range of motion & Thyroid normal Lungs: Normal respiratory effort; chest expands symmetrically. Lungs are clear to auscultation without rales, wheezes, or increased work of breathing. Heart: Normal rate and rhythm. Normal S1 and S2. No gallop, click, or rub. S4 with slurring at  LSB ;no murmur. Abdomen:  Bowel sounds normal; abdomen soft and nontender. No masses, organomegaly or hernias noted. Genitalia/DRE: Alex [name] is present on the left  . There is slight asymmetry of the prostate but it is not enlarged. The right lobe is firm without nodularity                                                   Musculoskeletal/extremities: Lordosis  noted of  the thoracic  spine. No clubbing, cyanosis, edema, or deformity noted. Range of motion  normal .Tone & strength  normal.Joints : minor DJD changes. Nail health  good. Vascular: Carotid, radial artery, dorsalis pedis and  posterior tibial pulses are full and equal. No bruits present. Neurologic: Alert and oriented x3. Deep tendon reflexes symmetrical and normal.          Skin: Intact without suspicious lesions or rashes. Lymph: No cervical, axillary, or inguinal lymphadenopathy present. Psych: Mood and affect are normal. Normally interactive                                                                                        Assessment & Plan:  #1 comprehensive physical exam; no acute findings #2 see Problem List with Assessments & Recommendations Plan: see Orders

## 2011-12-11 NOTE — Patient Instructions (Signed)
Preventive Health Care: Exercise at least 30-45 minutes a day,  3-4 days a week.  Eat a low-fat diet with lots of fruits and vegetables, up to 7-9 servings per day. Consume less than 40 grams of sugar per day from foods & drinks with High Fructose Corn Sugar as # 1,2,3 or # 4 on label. Health Care Power of Attorney & Living Will. Complete if not in place ; these place you in charge of your health care decisions. Blood Pressure Goal  Ideally is an AVERAGE < 135/85. This AVERAGE should be calculated from @ least 5-7 BP readings taken @ different times of day on different days of week. You should not respond to isolated BP readings , but rather the AVERAGE for that week  

## 2011-12-11 NOTE — Assessment & Plan Note (Signed)
The prostate is not enlarged but slightly asymmetric with induration noted on the right. PSA ordered

## 2011-12-11 NOTE — Assessment & Plan Note (Signed)
Today's blood pressure is atypical based on prior blood pressure measurements according to his history. He'll be asked to monitor the blood pressure at home; goals discussed

## 2011-12-12 ENCOUNTER — Ambulatory Visit (INDEPENDENT_AMBULATORY_CARE_PROVIDER_SITE_OTHER): Payer: BC Managed Care – PPO | Admitting: Gastroenterology

## 2011-12-12 DIAGNOSIS — E538 Deficiency of other specified B group vitamins: Secondary | ICD-10-CM

## 2011-12-16 ENCOUNTER — Telehealth: Payer: Self-pay

## 2011-12-16 MED ORDER — PRAVASTATIN SODIUM 20 MG PO TABS: ORAL_TABLET | ORAL | Status: DC

## 2011-12-16 NOTE — Telephone Encounter (Signed)
RX mailed to patient with labs

## 2011-12-16 NOTE — Telephone Encounter (Signed)
Message copied by Logan Bores on Mon Dec 16, 2011  2:16 PM ------      Message from: Hendricks Limes      Created: Sat Dec 14, 2011  7:52 AM       Please send a prescription for Pravastatin 20 mg; dispense 90, daily as directed.

## 2011-12-26 ENCOUNTER — Other Ambulatory Visit: Payer: Self-pay | Admitting: Internal Medicine

## 2011-12-27 NOTE — Telephone Encounter (Signed)
Prescription sent to pharmacy.

## 2012-01-10 ENCOUNTER — Ambulatory Visit (INDEPENDENT_AMBULATORY_CARE_PROVIDER_SITE_OTHER): Payer: BC Managed Care – PPO | Admitting: Gastroenterology

## 2012-01-10 DIAGNOSIS — E538 Deficiency of other specified B group vitamins: Secondary | ICD-10-CM

## 2012-02-05 ENCOUNTER — Encounter: Payer: Self-pay | Admitting: Gastroenterology

## 2012-02-12 ENCOUNTER — Ambulatory Visit (INDEPENDENT_AMBULATORY_CARE_PROVIDER_SITE_OTHER): Payer: BC Managed Care – PPO | Admitting: Gastroenterology

## 2012-02-12 DIAGNOSIS — E538 Deficiency of other specified B group vitamins: Secondary | ICD-10-CM | POA: Insufficient documentation

## 2012-03-18 ENCOUNTER — Ambulatory Visit (INDEPENDENT_AMBULATORY_CARE_PROVIDER_SITE_OTHER): Payer: BC Managed Care – PPO | Admitting: Gastroenterology

## 2012-03-18 DIAGNOSIS — E538 Deficiency of other specified B group vitamins: Secondary | ICD-10-CM

## 2012-04-22 ENCOUNTER — Ambulatory Visit (INDEPENDENT_AMBULATORY_CARE_PROVIDER_SITE_OTHER): Payer: BC Managed Care – PPO | Admitting: Gastroenterology

## 2012-04-22 DIAGNOSIS — E538 Deficiency of other specified B group vitamins: Secondary | ICD-10-CM

## 2012-04-22 MED ORDER — CYANOCOBALAMIN 1000 MCG/ML IJ SOLN
1000.0000 ug | INTRAMUSCULAR | Status: DC
  Administered 2012-05-27: 1000 ug via INTRAMUSCULAR

## 2012-05-27 ENCOUNTER — Ambulatory Visit (INDEPENDENT_AMBULATORY_CARE_PROVIDER_SITE_OTHER): Payer: BC Managed Care – PPO | Admitting: Gastroenterology

## 2012-05-27 DIAGNOSIS — E538 Deficiency of other specified B group vitamins: Secondary | ICD-10-CM

## 2012-07-01 ENCOUNTER — Ambulatory Visit (INDEPENDENT_AMBULATORY_CARE_PROVIDER_SITE_OTHER): Payer: BC Managed Care – PPO | Admitting: Gastroenterology

## 2012-07-01 DIAGNOSIS — E538 Deficiency of other specified B group vitamins: Secondary | ICD-10-CM

## 2012-08-05 ENCOUNTER — Ambulatory Visit (INDEPENDENT_AMBULATORY_CARE_PROVIDER_SITE_OTHER): Payer: BC Managed Care – PPO | Admitting: Gastroenterology

## 2012-08-05 DIAGNOSIS — E538 Deficiency of other specified B group vitamins: Secondary | ICD-10-CM

## 2012-08-05 MED ORDER — CYANOCOBALAMIN 1000 MCG/ML IJ SOLN
1000.0000 ug | Freq: Once | INTRAMUSCULAR | Status: AC
  Administered 2012-08-05: 1000 ug via INTRAMUSCULAR

## 2012-09-14 ENCOUNTER — Ambulatory Visit (INDEPENDENT_AMBULATORY_CARE_PROVIDER_SITE_OTHER): Payer: BC Managed Care – PPO | Admitting: Gastroenterology

## 2012-09-14 DIAGNOSIS — E538 Deficiency of other specified B group vitamins: Secondary | ICD-10-CM

## 2012-09-26 ENCOUNTER — Other Ambulatory Visit: Payer: Self-pay | Admitting: Internal Medicine

## 2012-09-28 NOTE — Telephone Encounter (Signed)
Rx sent.    MW 

## 2012-10-01 LAB — HM DIABETES EYE EXAM

## 2012-10-05 ENCOUNTER — Encounter: Payer: Self-pay | Admitting: *Deleted

## 2012-10-12 ENCOUNTER — Telehealth: Payer: Self-pay | Admitting: *Deleted

## 2012-10-12 NOTE — Telephone Encounter (Signed)
Check liver enzymes yearly. From fatty liver on abd U/S Phoned pt to do labs and he reminded me he has an appt tomorrow; left a note on appt to see if Dr Jarold Motto wants labs repeated.

## 2012-10-13 ENCOUNTER — Encounter: Payer: Self-pay | Admitting: Gastroenterology

## 2012-10-13 ENCOUNTER — Ambulatory Visit (INDEPENDENT_AMBULATORY_CARE_PROVIDER_SITE_OTHER): Payer: BC Managed Care – PPO | Admitting: Gastroenterology

## 2012-10-13 VITALS — BP 150/90 | HR 87 | Ht 70.0 in | Wt 206.4 lb

## 2012-10-13 DIAGNOSIS — K219 Gastro-esophageal reflux disease without esophagitis: Secondary | ICD-10-CM

## 2012-10-13 DIAGNOSIS — K2270 Barrett's esophagus without dysplasia: Secondary | ICD-10-CM

## 2012-10-13 MED ORDER — ESOMEPRAZOLE MAGNESIUM 40 MG PO CPDR: 40.0000 mg | DELAYED_RELEASE_CAPSULE | Freq: Every day | ORAL | Status: DC

## 2012-10-13 NOTE — Progress Notes (Signed)
This is a very pleasant 56 year old Caucasian male with chronic GERD and chronic Barrett's mucosa.  He is due for followup endoscopy and dysplasia screening biopsies.  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 Hepatic workup has otherwise been unremarkable.  Additional problems include B12 deficiency, and he is on monthly parenteral therapy.  He currently denies acid reflux symptoms, dysphagia, and is up-to-date on his colonoscopy exams.  His appetite is good and his weight is stable.  He denies any upper GI or hepatobiliary complaints otherwise.  Current Medications, Allergies, Past Medical History, Past Surgical History, Family History and Social History were reviewed in [employer] record.  Pertinent Review of Systems Negative   Physical Exam: Healthy patient in no distress.  Blood pressure 150/90 pulse 87 and regular and weight 206 with a BMI of 29.62.  I cannot appreciate stigmata of chronic liver disease.  His abdominal exam shows no organomegaly, masses or tenderness.  Bowel sounds are normal.  Mental status is normal.    Assessment and Plan: Chronic GERD doing well on daily Nexium therapy.  We have scheduled followup endoscopy with dysplasia biopsies per his history of chronic Barrett's mucosa.Marland Kitchen  He is to see Dr. Linna Darner for his CPX in February, and he will have liver function test and CBC done at that time.  We will see him on a yearly basis unless otherwise needed. No diagnosis found.

## 2012-10-13 NOTE — Patient Instructions (Addendum)
It has been recommended to you by your physician that you have a(n) Upper Endoscopy completed. Per your request, we did not schedule the procedure(s) today. Please contact our office at [phone] should you decide to have the procedure completed.   We have sent the following medications to your pharmacy for you to pick up at your convenience: Nexium, please take as directed

## 2012-10-15 ENCOUNTER — Ambulatory Visit (INDEPENDENT_AMBULATORY_CARE_PROVIDER_SITE_OTHER): Payer: BC Managed Care – PPO | Admitting: Gastroenterology

## 2012-10-15 DIAGNOSIS — E538 Deficiency of other specified B group vitamins: Secondary | ICD-10-CM

## 2012-11-18 ENCOUNTER — Ambulatory Visit (INDEPENDENT_AMBULATORY_CARE_PROVIDER_SITE_OTHER): Payer: BC Managed Care – PPO | Admitting: Gastroenterology

## 2012-11-18 DIAGNOSIS — E538 Deficiency of other specified B group vitamins: Secondary | ICD-10-CM

## 2012-12-16 ENCOUNTER — Ambulatory Visit (INDEPENDENT_AMBULATORY_CARE_PROVIDER_SITE_OTHER): Payer: BC Managed Care – PPO | Admitting: Internal Medicine

## 2012-12-16 ENCOUNTER — Encounter: Payer: Self-pay | Admitting: Internal Medicine

## 2012-12-16 VITALS — BP 126/84 | HR 75 | Temp 97.7°F | Resp 14 | Ht 70.03 in | Wt 207.4 lb

## 2012-12-16 DIAGNOSIS — I10 Essential (primary) hypertension: Secondary | ICD-10-CM

## 2012-12-16 DIAGNOSIS — Z0000 Encounter for general adult medical examination without abnormal findings: Secondary | ICD-10-CM

## 2012-12-16 DIAGNOSIS — Z23 Encounter for immunization: Secondary | ICD-10-CM

## 2012-12-16 LAB — CBC WITH DIFFERENTIAL/PLATELET
Basophils Absolute: 0.1 10*3/uL (ref 0.0–0.1)
Basophils Relative: 1.2 % (ref 0.0–3.0)
Eosinophils Absolute: 0.1 10*3/uL (ref 0.0–0.7)
Eosinophils Relative: 2.4 % (ref 0.0–5.0)
HCT: 47.9 % (ref 39.0–52.0)
Hemoglobin: 16.3 g/dL (ref 13.0–17.0)
Lymphocytes Relative: 28.1 % (ref 12.0–46.0)
Lymphs Abs: 1.4 10*3/uL (ref 0.7–4.0)
MCHC: 34 g/dL (ref 30.0–36.0)
MCV: 93.6 fl (ref 78.0–100.0)
Monocytes Absolute: 0.4 10*3/uL (ref 0.1–1.0)
Monocytes Relative: 9.3 % (ref 3.0–12.0)
Neutro Abs: 2.8 10*3/uL (ref 1.4–7.7)
Neutrophils Relative %: 59 % (ref 43.0–77.0)
Platelets: 162 10*3/uL (ref 150.0–400.0)
RBC: 5.11 Mil/uL (ref 4.22–5.81)
RDW: 12.6 % (ref 11.5–14.6)
WBC: 4.8 10*3/uL (ref 4.5–10.5)

## 2012-12-16 LAB — BASIC METABOLIC PANEL
BUN: 18 mg/dL (ref 6–23)
CO2: 28 mEq/L (ref 19–32)
Calcium: 9 mg/dL (ref 8.4–10.5)
Chloride: 101 mEq/L (ref 96–112)
Creatinine, Ser: 0.9 mg/dL (ref 0.4–1.5)
GFR: 88.37 mL/min (ref >60.00)
Glucose, Bld: 95 mg/dL (ref 70–99)
Potassium: 3.5 mEq/L (ref 3.5–5.1)
Sodium: 137 mEq/L (ref 135–145)

## 2012-12-16 LAB — LIPID PANEL
Cholesterol: 194 mg/dL (ref 0–200)
HDL: 41.9 mg/dL (ref >39.00)
LDL Cholesterol: 133 mg/dL — ABNORMAL HIGH (ref 0–99)
Total CHOL/HDL Ratio: 5
Triglycerides: 94 mg/dL (ref 0.0–149.0)
VLDL: 18.8 mg/dL (ref 0.0–40.0)

## 2012-12-16 LAB — TSH: TSH: 1.19 u[IU]/mL (ref 0.35–5.50)

## 2012-12-16 LAB — HEPATIC FUNCTION PANEL
ALT: 76 U/L — ABNORMAL HIGH (ref 0–53)
AST: 43 U/L — ABNORMAL HIGH (ref 0–37)
Albumin: 4.4 g/dL (ref 3.5–5.2)
Alkaline Phosphatase: 53 U/L (ref 39–117)
Bilirubin, Direct: 0 mg/dL (ref 0.0–0.3)
Total Bilirubin: 1.1 mg/dL (ref 0.3–1.2)
Total Protein: 7.7 g/dL (ref 6.0–8.3)

## 2012-12-16 MED ORDER — DILTIAZEM HCL ER COATED BEADS 240 MG PO CP24: ORAL_CAPSULE | ORAL | Status: DC

## 2012-12-16 MED ORDER — LOSARTAN POTASSIUM 50 MG PO TABS: 50.0000 mg | ORAL_TABLET | Freq: Every day | ORAL | Status: DC

## 2012-12-16 MED ORDER — HYDROCHLOROTHIAZIDE 12.5 MG PO CAPS: ORAL_CAPSULE | ORAL | Status: DC

## 2012-12-16 NOTE — Addendum Note (Signed)
Addended by: Maurice Small on: 12/16/2012 10:33 AM   Modules accepted: Orders

## 2012-12-16 NOTE — Patient Instructions (Addendum)
Preventive Health Care: Exercise at least 30-45 minutes a day,  3-4 days a week.  Eat a low-fat diet with lots of fruits and vegetables, up to 7-9 servings per day. This would eliminate the need for vitamin supplements. Consume less than 40 grams of sugar (preferably ZERO) per day from foods & drinks with High Fructose Corn Sugar as #1,2,3 or # 4 on label. Health Care Power of [place]. Complete these if not in place ; these place you in charge of your health care decisions. Take the EKG to any emergency room or preop visits. There are nonspecific changes; as long as there is no new change these are not clinically significant . If the old EKG is not available for comparison; it may result in unnecessary hospitalization for observation with significant unnecessary expense. Minimal Blood Pressure Goal= AVERAGE < 140/90;  Ideal is an AVERAGE < 135/85. This AVERAGE should be calculated from @ least 5-7 BP readings taken @ different times of day on different days of week. You should not respond to isolated BP readings , but rather the AVERAGE for that week .Please bring your  blood pressure cuff to office visits to verify that it is reliable.It  can also be checked against the blood pressure device at the pharmacy. Finger or wrist cuffs are not dependable; an arm cuff is. As per [place] all records must be reviewed and signed off within 72 hours; but I attempt to complete this within 36 hours unless I have no access to the electronic medical record.If I wait more than 36 hours the volume of reports becomes difficult to manage optimally. In my absence ;my partners would address the results.Critical lab results will be communicated to you ASAP. If you choose not to sign up for My Chart within 36 hours of labs being drawn; results will be reviewed & interpretation added before being copied & mailed, causing a delay in getting the results to you.If you do not receive that report within 7-10 days  ,please call. Additionally you can use this system to gain direct  access to your records  if  out of town or @ an office of a  physician who is not in  the My Chart network.  This improves continuity of care & places you in control of your medical record.

## 2012-12-16 NOTE — Progress Notes (Signed)
  Subjective:    Patient ID: Alex [name], male    DOB: 30-May-1956, 57 y.o.   MRN: [id number]  HPI   Alex [name] is here for a physical; he denies acute issues.    Review of Systems CHRONIC HYPERTENSION :  Home blood pressure  not monitored  Patient is compliant with medications  No adverse effects noted from medication  No exercise program but active on job.  On low-salt diet    No chest pain, palpitations, dyspnea, claudication,edema or paroxysmal nocturnal dyspnea described  No significant lightheadedness, headache, epistaxis, or syncope          Objective:   Physical Exam Gen.: Healthy and well-nourished in appearance. Alert, appropriate and cooperative throughout exam. Appears younger than stated age  Head: Normocephalic without obvious abnormalities Eyes: No corneal or conjunctival inflammation noted. Pupils equal round reactive to light and accommodation. Extraocular motion intact. Vision grossly normal with lenses Ears: External  ear exam reveals no significant lesions or deformities. Canals clear .TMs normal. Hearing is grossly normal bilaterally. Nose: External nasal exam reveals no deformity or inflammation. Nasal mucosa are pink and moist. No lesions or exudates noted.  Mouth: Oral mucosa and oropharynx reveal no lesions or exudates. Teeth in good repair. Neck: No deformities, masses, or tenderness noted. Range of motion & Thyroid normal. Lungs: Normal respiratory effort; chest expands symmetrically. Lungs are clear to auscultation without rales, wheezes, or increased work of breathing. Heart: Normal rate and rhythm. Normal S1 and S2. No gallop, click, or rub. S4 w/o murmur. Abdomen: Bowel sounds normal; abdomen soft and nontender. No masses, organomegaly or hernias noted. Genitalia: Genitalia normal except for left varices. Prostate is normal without enlargement, asymmetry, nodularity, or induration.                                    Musculoskeletal/extremities: No deformity or scoliosis noted of  the thoracic or lumbar spine.  No clubbing, cyanosis, edema, or significant extremity  deformity noted. Range of motion normal .Tone & strength  Normal. Joints reveal minor  DJD DIP changes. Nail health good. Able to lie down & sit up w/o help. Negative SLR bilaterally Vascular: Carotid, radial artery, dorsalis pedis and  posterior tibial pulses are full and equal. No bruits present. Neurologic: Alert and oriented x3. Deep tendon reflexes symmetrical and normal.      Skin: Intact without suspicious lesions or rashes. Lymph: No cervical, axillary, or inguinal lymphadenopathy present. Psych: Mood and affect are normal. Normally interactive                                                                                       Assessment & Plan:  #1 comprehensive physical exam; no acute findings  Plan: see Orders  & Recommendations

## 2012-12-18 ENCOUNTER — Encounter: Payer: Self-pay | Admitting: Gastroenterology

## 2012-12-23 ENCOUNTER — Ambulatory Visit (INDEPENDENT_AMBULATORY_CARE_PROVIDER_SITE_OTHER): Payer: BC Managed Care – PPO | Admitting: Gastroenterology

## 2012-12-23 DIAGNOSIS — E538 Deficiency of other specified B group vitamins: Secondary | ICD-10-CM

## 2013-01-21 ENCOUNTER — Telehealth: Payer: Self-pay | Admitting: Gastroenterology

## 2013-01-21 DIAGNOSIS — E538 Deficiency of other specified B group vitamins: Secondary | ICD-10-CM

## 2013-01-21 MED ORDER — CYANOCOBALAMIN 1000 MCG/ML IJ SOLN: 1000.0000 ug | Freq: Once | INTRAMUSCULAR | Status: DC

## 2013-01-21 NOTE — Telephone Encounter (Signed)
[facility] agreed to fill the one injection for pt. lmom for him to call back.

## 2013-01-21 NOTE — Telephone Encounter (Signed)
Informed pt his B12 was ordered but I forgot to order a syringe and needle; pt stated understanding.

## 2013-04-29 ENCOUNTER — Encounter: Payer: Self-pay | Admitting: Internal Medicine

## 2013-04-29 ENCOUNTER — Ambulatory Visit (INDEPENDENT_AMBULATORY_CARE_PROVIDER_SITE_OTHER): Payer: BC Managed Care – PPO | Admitting: Internal Medicine

## 2013-04-29 VITALS — BP 118/70 | HR 75 | Temp 98.3°F | Wt 212.4 lb

## 2013-04-29 DIAGNOSIS — L500 Allergic urticaria: Secondary | ICD-10-CM

## 2013-04-29 MED ORDER — HYDROXYZINE HCL 10 MG PO TABS: 10.0000 mg | ORAL_TABLET | Freq: Three times a day (TID) | ORAL | Status: DC | PRN

## 2013-04-29 MED ORDER — PREDNISONE 20 MG PO TABS: ORAL_TABLET | ORAL | Status: DC

## 2013-04-29 MED ORDER — MOMETASONE FUROATE 0.1 % EX OINT: TOPICAL_OINTMENT | Freq: Two times a day (BID) | CUTANEOUS | Status: DC

## 2013-04-29 NOTE — Patient Instructions (Addendum)
Avoid soaps and cosmetics which are not hypoallergenic. Restrict hyperallergenic foods at this time: Nuts, strawberries, seafood , chocolate, and tomatoes.

## 2013-04-29 NOTE — Progress Notes (Signed)
  Subjective:    Patient ID: Alex [name], male    DOB: 03/13/1956, 57 y.o.   MRN: [id number]  HPI  Symptoms began 8 days ago as a rash on the left knee, right forearm, lower abdomen which was pruritic and red  He had worked in his garden but no been definite exposure to poison ivy , insects or other vectors.  He's had no definite exposures to chemicals or other agents which might have caused the rash.  Cortisone 10 apply topically was more effective than calamine lotion.      Review of Systems    He denies fever, chills, sweats, nausea, arthralgias, headache, dizziness.  He denies any associated angioedema, wheezing, or shortness of breath      Objective:   Physical Exam    He appears healthy and well-nourished in no distress  Nares and oropharynx are unremarkable with no evidence of angioedema  Chest is clear with no wheezing or rhonchi  Regular rhythm is present  He has no organomegaly or masses  He has no lymphadenopathy about the neck or axilla  He has raised plaque like lesions in irregular distribution over the abdomen ,right forearm and left knee. These lesions blanch with pressure. Mild dermatographia is also present        Assessment & Plan:  #1 urticarial dermatitis  Plan: See orders and recommendations

## 2013-07-22 ENCOUNTER — Ambulatory Visit (INDEPENDENT_AMBULATORY_CARE_PROVIDER_SITE_OTHER): Payer: BC Managed Care – PPO | Admitting: *Deleted

## 2013-07-22 DIAGNOSIS — E538 Deficiency of other specified B group vitamins: Secondary | ICD-10-CM

## 2013-07-22 MED ORDER — CYANOCOBALAMIN 1000 MCG/ML IJ SOLN
1000.0000 ug | Freq: Once | INTRAMUSCULAR | Status: AC
  Administered 2013-07-22: 1000 ug via INTRAMUSCULAR

## 2013-08-13 ENCOUNTER — Emergency Department (HOSPITAL_COMMUNITY): Payer: Worker's Compensation

## 2013-08-13 ENCOUNTER — Encounter (HOSPITAL_COMMUNITY): Payer: Self-pay | Admitting: Emergency Medicine

## 2013-08-13 ENCOUNTER — Emergency Department (HOSPITAL_COMMUNITY)
Admission: EM | Admit: 2013-08-13 | Discharge: 2013-08-13 | Disposition: A | Discharge status: Home / Self Care | Payer: Worker's Compensation | Attending: Emergency Medicine | Admitting: Emergency Medicine

## 2013-08-13 DIAGNOSIS — S61209A Unspecified open wound of unspecified finger without damage to nail, initial encounter: Secondary | ICD-10-CM | POA: Insufficient documentation

## 2013-08-13 DIAGNOSIS — I10 Essential (primary) hypertension: Secondary | ICD-10-CM | POA: Insufficient documentation

## 2013-08-13 DIAGNOSIS — S61219A Laceration without foreign body of unspecified finger without damage to nail, initial encounter: Secondary | ICD-10-CM

## 2013-08-13 DIAGNOSIS — Z8639 Personal history of other endocrine, nutritional and metabolic disease: Secondary | ICD-10-CM | POA: Insufficient documentation

## 2013-08-13 DIAGNOSIS — Y9389 Activity, other specified: Secondary | ICD-10-CM | POA: Insufficient documentation

## 2013-08-13 DIAGNOSIS — Z79899 Other long term (current) drug therapy: Secondary | ICD-10-CM | POA: Insufficient documentation

## 2013-08-13 DIAGNOSIS — Z862 Personal history of diseases of the blood and blood-forming organs and certain disorders involving the immune mechanism: Secondary | ICD-10-CM | POA: Insufficient documentation

## 2013-08-13 DIAGNOSIS — Y929 Unspecified place or not applicable: Secondary | ICD-10-CM | POA: Insufficient documentation

## 2013-08-13 DIAGNOSIS — K219 Gastro-esophageal reflux disease without esophagitis: Secondary | ICD-10-CM | POA: Insufficient documentation

## 2013-08-13 DIAGNOSIS — W230XXA Caught, crushed, jammed, or pinched between moving objects, initial encounter: Secondary | ICD-10-CM | POA: Insufficient documentation

## 2013-08-13 DIAGNOSIS — Z8739 Personal history of other diseases of the musculoskeletal system and connective tissue: Secondary | ICD-10-CM | POA: Insufficient documentation

## 2013-08-13 MED ORDER — HYDROCODONE-ACETAMINOPHEN 5-325 MG PO TABS: 1.0000 | ORAL_TABLET | Freq: Three times a day (TID) | ORAL | Status: DC | PRN

## 2013-08-13 NOTE — ED Notes (Signed)
Pt c/o of right ring finger laceration. States that his finger got stuck in the car door. Pain 4/10.

## 2013-08-13 NOTE — ED Provider Notes (Signed)
Suffered injury to right ring finger distal phalanx, palmar aspect when he closes finger in a car door. On exam volar pad of the distal phalanx right ring finger has a flap laceration. Minimal active bleeding, sensation intact. There is a tiny subungual hematoma. Finger has full range of motion. Neurovascularly intact  Doug Sou, MD 08/13/13 1336

## 2013-08-13 NOTE — ED Notes (Signed)
Per pt was loading clothes in a car and closed right ring finger in rear driver side of car. Upon assessment laceration noted on enter portion of finger. Pt denies numbness or tingling.

## 2013-08-13 NOTE — ED Provider Notes (Signed)
CSN: [id number]     Arrival date & time 08/13/13  1134 History  This chart was scribed for non-physician practitioner Raymon Mutton working with Doug Sou, MD by Carl Best, ED Scribe. This patient was seen in room WTR8/WTR8 and the patient's care was started at 12:49 PM.    Chief Complaint  Patient presents with  . Finger Injury    The history is provided by the patient. No language interpreter was used.   HPI Comments: Alex [name] is a 57 y.o. male who presents to the Emergency Department complaining of a laceration to his right ring finger that occurred today after closing the car door on his finger while loading clothes in his car.  The patient states that the injury was not pulsating blood at the time of the incident.  He rates the pain at a 4-5/10 currently and describes the pain as non-radiating and feeling like there is "pressure" on his right finger.  The patient denies numbness or tingling, loss of sensation to the finger, pain with motion, as associated symptoms.  He states that the laceration is still bleeding slightly but is controlled with pressure.  He denies having a prior history of injury to the right ring finger.  He is right hand dominant.  The patient denies taking anticoagulants but states that he takes blood pressure medication.    Past Medical History  Diagnosis Date  . GERD (gastroesophageal reflux disease)   . Barrett's esophagus   . Hypertension   . B12 deficiency   . Homocystinemia   . Achilles tendinitis     PMH of  . Hyperlipidemia   . Nonspecific elevation of levels of transaminase or lactic acid dehydrogenase (LDH)     PMH of  . Fatty liver 10-16-2011    [place]  . Diverticulosis of colon (without mention of hemorrhage)   . Sullivan Lone syndrome    Past Surgical History  Procedure Laterality Date  . Vasectomy    . Wisdom tooth extraction    . Upper gastrointestinal endoscopy      Dr Jarold Motto; multiple   . Colonoscopy  2010    negative    Family History  Problem Relation Age of Onset  . Esophageal cancer Father   . Stomach cancer Father   . Hypertension Father   . Heart disease Father     CBAG @ 9  . Hypertension Mother   . Heart attack Mother 71  . Diabetes Sister   . Stroke Neg Hx    History  Substance Use Topics  . Smoking status: Never Smoker   . Smokeless tobacco: Never Used  . Alcohol Use: No    Review of Systems  Musculoskeletal: Negative for arthralgias.  Skin: Positive for wound (right ring finger).  Neurological: Negative for numbness.  Hematological: Does not bruise/bleed easily.  All other systems reviewed and are negative.    Allergies  Review of patient's allergies indicates no known allergies.  Home Medications   Current Outpatient Rx  Name  Route  Sig  Dispense  Refill  . cyanocobalamin (,VITAMIN B-12,) 1000 MCG/ML injection   Intramuscular   Inject 1 mL (1,000 mcg total) into the muscle once.   1 mL   0   . diltiazem (CARDIZEM CD) 240 MG 24 hr capsule      take 1 capsule by mouth once daily   90 capsule   3   . esomeprazole (NEXIUM) 40 MG capsule   Oral   Take 1 capsule (40  mg total) by mouth daily before breakfast.   30 capsule   12   . fexofenadine (ALLEGRA) 180 MG tablet   Oral   Take 180 mg by mouth daily.           Marland Kitchen glucosamine-chondroitin 500-400 MG tablet   Oral   Take 1 tablet by mouth 3 (three) times daily.         . hydrochlorothiazide (MICROZIDE) 12.5 MG capsule      take 1 capsule by mouth once daily   90 capsule   3   . losartan (COZAAR) 50 MG tablet   Oral   Take 1 tablet (50 mg total) by mouth daily.   90 tablet   3    Triage Vitals: BP 166/97  Pulse 95  Temp(Src) 98.9 F (37.2 C) (Oral)  Resp 18  Ht 5\' 11"  (1.803 m)  Wt 210 lb (95.255 kg)  BMI 29.3 kg/m2  SpO2 98%  Physical Exam  Nursing note and vitals reviewed. Constitutional: He is oriented to person, place, and time. He appears well-developed and well-nourished. No  distress.  HENT:  Head: Normocephalic and atraumatic.  Eyes: Conjunctivae and EOM are normal. Pupils are equal, round, and reactive to light.  Neck: Normal range of motion. Neck supple.  Cardiovascular: Normal rate and regular rhythm.  Exam reveals no friction rub.   No murmur heard. Pulses:      Radial pulses are 2+ on the right side, and 2+ on the left side.  Pulmonary/Chest: Effort normal and breath sounds normal. No respiratory distress. He has no wheezes. He has no rales.  Musculoskeletal: Normal range of motion.  Full range of motion to PIP, DIPs, MCP of the left hand  Neurological: He is alert and oriented to person, place, and time. He exhibits normal muscle tone. Coordination normal.  Strength intact-5+/5+ to PIP, DIPs, MCPs of the right hand Sensation intact with differentiation to sharp and dull touch to right hand  Skin: Skin is warm and dry. No rash noted. He is not diaphoretic. No erythema.  Approximately 2 cm laceration to the distal phalanx, pad of the right ring finger. Finger nail intact.   Psychiatric: He has a normal mood and affect. His behavior is normal. Thought content normal.    ED Course  Procedures (including critical care time)  DIAGNOSTIC STUDIES: Oxygen Saturation is 98% on room air, normal by my interpretation.    COORDINATION OF CARE: 12:53 PM- Discussed obtaining an x-ray of the right ring finger to rule out any suspicion of a fracture.  Discussed performing a laceration repair to the right ring finger.  Advised the patient to wear a glove to ensure the dressing does not come off or get wet.  The patient agreed to the treatment plan.    1:14 PM Patient seen and assessed by Dr. Consuella Lose REPAIR Performed by: Raymon Mutton Authorized by: Raymon Mutton Consent: Verbal consent obtained. Risks and benefits: risks, benefits and alternatives were discussed Consent given by: patient Patient identity confirmed: provided demographic  data Prepped and Draped in normal sterile fashion Wound explored  Laceration Location: Distal phalanx, pad of right ring finger  Laceration Length: 2 cm  No Foreign Bodies seen or palpated  Anesthesia: local infiltration  Local anesthetic: lidocaine 2% without epinephrine  Anesthetic total: 4 ml  Irrigation method: syringe Amount of cleaning: Extensively   Skin closure: Approximate   Number of sutures: 5, 6-0 Ethilon   Technique: Single interrupted   Patient tolerance:  Patient tolerated the procedure well with no immediate complications.   Labs Review Labs Reviewed - No data to display Imaging Review Dg Finger Ring Right  08/13/2013   CLINICAL DATA:  Injury.  EXAM: RIGHT RING FINGER 2+V  COMPARISON:  None.  FINDINGS: Soft tissue injury right 4th finger distal phalanx level without underlying fracture or dislocation.  IMPRESSION: Soft tissue injury right 4th finger distal phalanx level without underlying fracture or dislocation.   Electronically Signed   By: Bridgett Larsson M.D.   On: 08/13/2013 12:50    EKG Interpretation   None       MDM   1. Finger laceration, initial encounter     I personally performed the services described in this documentation, which was scribed in my presence. The recorded information has been reviewed and is accurate.  Patient presenting to emergency department with laceration to the right ring finger that occurred this afternoon while patient got finger stuck in the car door. Alert and oriented.  Approximately 2 cm laceration to the distal phalanx of the right ring finger, finger pad. Strength intact to PIP, DIPs, MCP joints of the right and and right ring finger. Sensation intact with differentiation to sharp and dull touch. Full range of motion. Patient neurovascularly intact.  Plain films negative for any foreign bodies or fractures. Laceration sutured, extensive cleaning, 5 sutures placed with 6-0 Ethilon threaded. Good hemostasis.  Patient tolerated procedure well. Patient stable, afebrile. Discharged patient. Discussed with patient proper hygiene and wound care techniques. Discussed with patient to keep him covered. Small dose of pain medications given, precautions and disposal techniques discussed. Discussed with patient that he does need to be removed within 5-6 days. Referred to hand surgeon and primary care provider. Discussed with patient to closely monitor symptoms and if symptoms are to worsen or change report back to emergency department-strict return instructions given. Patient agreed to plan of care, understood, all questions answered.  Raymon Mutton, PA-C 08/13/13 2357

## 2013-08-14 NOTE — ED Provider Notes (Signed)
Medical screening examination/treatment/procedure(s) were conducted as a shared visit with non-physician practitioner(s) and myself.  I personally evaluated the patient during the encounter  Doug Sou, MD 08/14/13 [phone]

## 2013-08-25 ENCOUNTER — Ambulatory Visit (INDEPENDENT_AMBULATORY_CARE_PROVIDER_SITE_OTHER): Payer: BC Managed Care – PPO | Admitting: *Deleted

## 2013-08-25 DIAGNOSIS — E538 Deficiency of other specified B group vitamins: Secondary | ICD-10-CM

## 2013-08-25 MED ORDER — CYANOCOBALAMIN 1000 MCG/ML IJ SOLN
1000.0000 ug | Freq: Once | INTRAMUSCULAR | Status: AC
  Administered 2013-08-25: 1000 ug via INTRAMUSCULAR

## 2013-08-31 ENCOUNTER — Encounter: Payer: Self-pay | Admitting: Gastroenterology

## 2013-09-29 ENCOUNTER — Ambulatory Visit (INDEPENDENT_AMBULATORY_CARE_PROVIDER_SITE_OTHER): Payer: BC Managed Care – PPO | Admitting: *Deleted

## 2013-09-29 DIAGNOSIS — E538 Deficiency of other specified B group vitamins: Secondary | ICD-10-CM

## 2013-09-29 MED ORDER — CYANOCOBALAMIN 1000 MCG/ML IJ SOLN
1000.0000 ug | Freq: Once | INTRAMUSCULAR | Status: AC
  Administered 2013-09-29: 1000 ug via INTRAMUSCULAR

## 2013-10-14 ENCOUNTER — Encounter: Payer: Self-pay | Admitting: Gastroenterology

## 2013-10-14 ENCOUNTER — Ambulatory Visit (INDEPENDENT_AMBULATORY_CARE_PROVIDER_SITE_OTHER): Payer: BC Managed Care – PPO | Admitting: Gastroenterology

## 2013-10-14 VITALS — BP 140/90 | HR 92 | Ht 70.5 in | Wt 215.6 lb

## 2013-10-14 DIAGNOSIS — K2270 Barrett's esophagus without dysplasia: Secondary | ICD-10-CM

## 2013-10-14 DIAGNOSIS — K219 Gastro-esophageal reflux disease without esophagitis: Secondary | ICD-10-CM

## 2013-10-14 MED ORDER — ESOMEPRAZOLE MAGNESIUM 40 MG PO CPDR: 40.0000 mg | DELAYED_RELEASE_CAPSULE | Freq: Every day | ORAL | Status: DC

## 2013-10-14 NOTE — Progress Notes (Signed)
This is a 57 year old Caucasian male with chronic GERD and chronic Barrett's esophagus.  He is currently asymptomatic on Nexium 40 mg a day.  He specifically denies chest pain or dysphagia.  He is up-to-date on his colonoscopy, and his last endoscopy was 5 years ago.  He is followed medically by Dr. Marga Melnick.  Labs have been unremarkable.  He denies lower gastrointestinal or hepatobiliary complaints.  Current Medications, Allergies, Past Medical History, Past Surgical History, Family History and Social History were reviewed in [employer] record.  ROS: All systems were reviewed and are negative unless otherwise stated in the HPI.          Physical Exam: Blood pressure 140/90, pulse 92 and regular and weight 2:15 with a BMI of 30.49 healthy-appearing patient in no distress without stigmata of chronic liver disease.  Chest is clear he is in a regular rhythm without murmurs gallops or rubs.  There is no hepatosplenomegaly, abdominal masses or tenderness.  Bowel sounds are normal.  Mental status is normal.    Assessment and Plan: Chronic GERD doing well on daily PPI therapy.  I've scheduled him for followup endoscopy and dysplasia screening in February of this year.  Reflux regime again reviewed with patient.

## 2013-10-14 NOTE — Patient Instructions (Signed)
You have been scheduled for an endoscopy with propofol. Please follow written instructions given to you at your visit today. If you use inhalers (even only as needed), please bring them with you on the day of your procedure. Your physician has requested that you go to www.startemmi.com and enter the access code given to you at your visit today. This web site gives a general overview about your procedure. However, you should still follow specific instructions given to you by our office regarding your preparation for the procedure.  Refill of Nexium was sent to your pharmacy

## 2013-10-18 ENCOUNTER — Encounter: Payer: Self-pay | Admitting: Gastroenterology

## 2013-11-03 ENCOUNTER — Ambulatory Visit (INDEPENDENT_AMBULATORY_CARE_PROVIDER_SITE_OTHER): Payer: BC Managed Care – PPO | Admitting: *Deleted

## 2013-11-03 DIAGNOSIS — E538 Deficiency of other specified B group vitamins: Secondary | ICD-10-CM

## 2013-11-03 MED ORDER — CYANOCOBALAMIN 1000 MCG/ML IJ SOLN
1000.0000 ug | Freq: Once | INTRAMUSCULAR | Status: AC
  Administered 2013-11-03: 1000 ug via INTRAMUSCULAR

## 2013-12-03 ENCOUNTER — Telehealth: Payer: Self-pay | Admitting: Gastroenterology

## 2013-12-03 ENCOUNTER — Encounter: Payer: BC Managed Care – PPO | Admitting: Gastroenterology

## 2013-12-03 NOTE — Telephone Encounter (Signed)
Let endo know

## 2013-12-08 ENCOUNTER — Ambulatory Visit (INDEPENDENT_AMBULATORY_CARE_PROVIDER_SITE_OTHER): Payer: BC Managed Care – PPO

## 2013-12-08 DIAGNOSIS — E538 Deficiency of other specified B group vitamins: Secondary | ICD-10-CM

## 2013-12-08 MED ORDER — CYANOCOBALAMIN 1000 MCG/ML IJ SOLN
1000.0000 ug | Freq: Once | INTRAMUSCULAR | Status: AC
  Administered 2013-12-08: 1000 ug via INTRAMUSCULAR

## 2013-12-21 ENCOUNTER — Telehealth: Payer: Self-pay

## 2013-12-21 DIAGNOSIS — I10 Essential (primary) hypertension: Secondary | ICD-10-CM

## 2013-12-21 NOTE — Telephone Encounter (Signed)
The patient moved his cpe to early march - and is hoping to get all three of blood pressure meds refilled (he states they are running out)   [facility] on [address]   Thanks!

## 2013-12-22 ENCOUNTER — Encounter: Payer: BC Managed Care – PPO | Admitting: Internal Medicine

## 2013-12-22 MED ORDER — DILTIAZEM HCL ER COATED BEADS 240 MG PO CP24: ORAL_CAPSULE | ORAL | Status: DC

## 2013-12-22 MED ORDER — LOSARTAN POTASSIUM 50 MG PO TABS: 50.0000 mg | ORAL_TABLET | Freq: Every day | ORAL | Status: DC

## 2013-12-22 MED ORDER — HYDROCHLOROTHIAZIDE 12.5 MG PO CAPS: ORAL_CAPSULE | ORAL | Status: DC

## 2013-12-22 NOTE — Telephone Encounter (Signed)
Rx's sent to the pharmacy by e-script.//AB/CMA

## 2014-01-03 ENCOUNTER — Other Ambulatory Visit (INDEPENDENT_AMBULATORY_CARE_PROVIDER_SITE_OTHER): Payer: BC Managed Care – PPO

## 2014-01-03 ENCOUNTER — Encounter: Payer: Self-pay | Admitting: Internal Medicine

## 2014-01-03 ENCOUNTER — Ambulatory Visit (INDEPENDENT_AMBULATORY_CARE_PROVIDER_SITE_OTHER): Payer: BC Managed Care – PPO | Admitting: Internal Medicine

## 2014-01-03 VITALS — BP 150/100 | HR 84 | Temp 97.8°F | Resp 12 | Ht 70.0 in | Wt 215.4 lb

## 2014-01-03 DIAGNOSIS — E785 Hyperlipidemia, unspecified: Secondary | ICD-10-CM

## 2014-01-03 DIAGNOSIS — Z0000 Encounter for general adult medical examination without abnormal findings: Secondary | ICD-10-CM

## 2014-01-03 DIAGNOSIS — Z8719 Personal history of other diseases of the digestive system: Secondary | ICD-10-CM

## 2014-01-03 LAB — LIPID PANEL
Cholesterol: 184 mg/dL (ref 0–200)
HDL: 37.8 mg/dL — ABNORMAL LOW (ref >39.00)
LDL Cholesterol: 122 mg/dL — ABNORMAL HIGH (ref 0–99)
Total CHOL/HDL Ratio: 5
Triglycerides: 120 mg/dL (ref 0.0–149.0)
VLDL: 24 mg/dL (ref 0.0–40.0)

## 2014-01-03 LAB — HEPATIC FUNCTION PANEL
ALT: 56 U/L — ABNORMAL HIGH (ref 0–53)
AST: 36 U/L (ref 0–37)
Albumin: 4.4 g/dL (ref 3.5–5.2)
Alkaline Phosphatase: 56 U/L (ref 39–117)
Bilirubin, Direct: 0.2 mg/dL (ref 0.0–0.3)
Total Bilirubin: 0.8 mg/dL (ref 0.3–1.2)
Total Protein: 7.2 g/dL (ref 6.0–8.3)

## 2014-01-03 LAB — CBC WITH DIFFERENTIAL/PLATELET
Basophils Absolute: 0 10*3/uL (ref 0.0–0.1)
Basophils Relative: 0.5 % (ref 0.0–3.0)
Eosinophils Absolute: 0.1 10*3/uL (ref 0.0–0.7)
Eosinophils Relative: 2.2 % (ref 0.0–5.0)
HCT: 46.3 % (ref 39.0–52.0)
Hemoglobin: 16.1 g/dL (ref 13.0–17.0)
Lymphocytes Relative: 30.1 % (ref 12.0–46.0)
Lymphs Abs: 1.2 10*3/uL (ref 0.7–4.0)
MCHC: 34.8 g/dL (ref 30.0–36.0)
MCV: 92.3 fl (ref 78.0–100.0)
Monocytes Absolute: 0.3 10*3/uL (ref 0.1–1.0)
Monocytes Relative: 7.1 % (ref 3.0–12.0)
Neutro Abs: 2.5 10*3/uL (ref 1.4–7.7)
Neutrophils Relative %: 60.1 % (ref 43.0–77.0)
Platelets: 168 10*3/uL (ref 150.0–400.0)
RBC: 5.02 Mil/uL (ref 4.22–5.81)
RDW: 12.7 % (ref 11.5–14.6)
WBC: 4.1 10*3/uL — ABNORMAL LOW (ref 4.5–10.5)

## 2014-01-03 LAB — BASIC METABOLIC PANEL
BUN: 16 mg/dL (ref 6–23)
CO2: 28 mEq/L (ref 19–32)
Calcium: 9.1 mg/dL (ref 8.4–10.5)
Chloride: 103 mEq/L (ref 96–112)
Creatinine, Ser: 0.9 mg/dL (ref 0.4–1.5)
GFR: 88.04 mL/min (ref >60.00)
Glucose, Bld: 116 mg/dL — ABNORMAL HIGH (ref 70–99)
Potassium: 3.5 mEq/L (ref 3.5–5.1)
Sodium: 140 mEq/L (ref 135–145)

## 2014-01-03 LAB — PSA: PSA: 0.71 ng/mL (ref 0.10–4.00)

## 2014-01-03 LAB — TSH: TSH: 2.04 u[IU]/mL (ref 0.35–5.50)

## 2014-01-03 LAB — VITAMIN B12: Vitamin B-12: 345 pg/mL (ref 211–911)

## 2014-01-03 NOTE — Patient Instructions (Signed)
Your next office appointment will be determined based upon review of your pending labs . Those instructions will be transmitted to you through My Chart  Followup as needed for your acute issue. Please report any significant change in your symptoms. Minimal Blood Pressure Goal= AVERAGE < 140/90;  Ideal is an AVERAGE < 135/85. This AVERAGE should be calculated from @ least 5-7 BP readings taken @ different times of day on different days of week. You should not respond to isolated BP readings , but rather the AVERAGE for that week .Please bring your  blood pressure cuff to office visits to verify that it is reliable.It  can also be checked against the blood pressure device at the pharmacy. Finger or wrist cuffs are not dependable; an arm cuff is.Plain Mucinex (NOT D) for thick secretions ;force NON dairy fluids .   Nasal cleansing in the shower as discussed with lather of mild shampoo.After 10 seconds wash off lather while  exhaling through nostrils. Make sure that all residual soap is removed to prevent irritation.  Flonase OR Nasacort AQ 1 spray in each nostril twice a day as needed. Use the "crossover" technique into opposite nostril spraying toward opposite ear @ 45 degree angle, not straight up into nostril.  Use a Neti pot daily only  as needed for significant sinus congestion; going from open side to congested side . Plain Allegra (NOT D )  160 daily , Loratidine 10 mg , OR Zyrtec 10 mg @ bedtime  as needed for itchy eyes & sneezing.

## 2014-01-03 NOTE — Progress Notes (Signed)
Subjective:    Patient ID: Alex [name], male    DOB: 1956/07/30, 58 y.o.   MRN: [id number]  HPI  He is here for a physical;acute issues include cough .     Review of Systems   With recent exposure to cold environment on his job; he's developed a nonproductive cough. He may have had some postnasal drainage. He specifically denies frontal headache, facial pain, nasal purulence, purulent sputum, fever, chills,  Sweats, dyspnea , or wheezing.Marland Kitchen He has been using Delsym and other over-the-counter medicines.  He is overdue for followup endoscopy for his Barrett's esophagus.   Blood pressure range / average : not monitored Compliant with anti hypertemsive medication. No lightheadedness or other adverse medication effect described.  A heart healthy is not followed;low salt diet is followed. Exercise is irregular. Significant headaches, epistaxis, chest pain, palpitations, exertional dyspnea, claudication, paroxysmal nocturnal dyspnea, or edema absent.      Objective:   Physical Exam Gen.: Healthy and well-nourished in appearance. Alert, appropriate and cooperative throughout exam. Head: Normocephalic without obvious abnormalities .  Eyes: No corneal or conjunctival inflammation noted. Pupils equal round reactive to light and accommodation. Extraocular motion intact. Fundal exam is benign without hemorrhages, exudate, papilledema.  Arteriolar narrowing Ears: External  ear exam reveals no significant lesions or deformities. Canals clear .TMs normal. Hearing is grossly normal bilaterally. Nose: External nasal exam reveals no deformity or inflammation. Nasal mucosa are pink and moist. No lesions or exudates noted.   Mouth: Oral mucosa and oropharynx reveal no lesions or exudates. Teeth in good repair. Neck: No deformities, masses, or tenderness noted. Range of motion & Thyroid normal Lungs: Normal respiratory effort; chest expands symmetrically. Lungs are clear to auscultation without  rales, wheezes, or increased work of breathing. Heart: Normal rate and rhythm. Normal S1 and S2. No gallop, click, or rub. S4 w/o murmur. Abdomen: Bowel sounds normal; abdomen soft and nontender. No masses, organomegaly or hernias noted. Genitalia: Genitalia normal except for left varices. Prostate is normal without enlargement, asymmetry,or  Nodularity. R lobe very firm.                             Musculoskeletal/extremities: Accentuated curvature of mid thoracic spine.  No clubbing, cyanosis, edema, or significant extremity  deformity noted. Range of motion normal .Tone & strength normal. Hand joints normal.  Valgus changes of the knees. Effusion palpable at the right knee. Crepitus bilaterally.  Fingernail  health good. Able to lie down & sit up w/o help. Negative SLR bilaterally Vascular: Carotid, radial artery, dorsalis pedis and  posterior tibial pulses are full and equal. No bruits present. Neurologic: Alert and oriented x3. Deep tendon reflexes symmetrical and normal.  Gait normal  .Skin: Intact without suspicious lesions or rashes. Lymph: No cervical, axillary, or inguinal lymphadenopathy present. Psych: Mood and affect are normal. Normally interactive                                                                                        Assessment & Plan:  #1 comprehensive physical exam; no acute findings #2 hypertension, suboptimal control.  EKG does not reveal hypertensive changes. In fact there is diffuse improvement in T. voltage compared to his prior EKG. Plan: see Orders  & Recommendations

## 2014-01-03 NOTE — Progress Notes (Signed)
Pre visit review using our clinic review tool, if applicable. No additional management support is needed unless otherwise documented below in the visit note. 

## 2014-01-06 ENCOUNTER — Ambulatory Visit (INDEPENDENT_AMBULATORY_CARE_PROVIDER_SITE_OTHER): Payer: BC Managed Care – PPO | Admitting: *Deleted

## 2014-01-06 DIAGNOSIS — E538 Deficiency of other specified B group vitamins: Secondary | ICD-10-CM

## 2014-01-06 MED ORDER — CYANOCOBALAMIN 1000 MCG/ML IJ SOLN
1000.0000 ug | Freq: Once | INTRAMUSCULAR | Status: AC
  Administered 2014-01-06: 1000 ug via INTRAMUSCULAR

## 2014-01-10 ENCOUNTER — Encounter: Payer: Self-pay | Admitting: *Deleted

## 2014-02-09 ENCOUNTER — Ambulatory Visit (INDEPENDENT_AMBULATORY_CARE_PROVIDER_SITE_OTHER): Payer: BC Managed Care – PPO

## 2014-02-09 DIAGNOSIS — E538 Deficiency of other specified B group vitamins: Secondary | ICD-10-CM

## 2014-02-09 MED ORDER — CYANOCOBALAMIN 1000 MCG/ML IJ SOLN
1000.0000 ug | Freq: Once | INTRAMUSCULAR | Status: AC
  Administered 2014-02-09: 1000 ug via INTRAMUSCULAR

## 2014-03-15 ENCOUNTER — Ambulatory Visit (INDEPENDENT_AMBULATORY_CARE_PROVIDER_SITE_OTHER): Payer: BC Managed Care – PPO | Admitting: *Deleted

## 2014-03-15 DIAGNOSIS — E538 Deficiency of other specified B group vitamins: Secondary | ICD-10-CM

## 2014-03-15 MED ORDER — CYANOCOBALAMIN 1000 MCG/ML IJ SOLN
1000.0000 ug | Freq: Once | INTRAMUSCULAR | Status: AC
  Administered 2014-03-15: 1000 ug via INTRAMUSCULAR

## 2014-04-19 ENCOUNTER — Ambulatory Visit (INDEPENDENT_AMBULATORY_CARE_PROVIDER_SITE_OTHER): Payer: BC Managed Care – PPO

## 2014-04-19 DIAGNOSIS — E538 Deficiency of other specified B group vitamins: Secondary | ICD-10-CM

## 2014-05-25 ENCOUNTER — Ambulatory Visit (INDEPENDENT_AMBULATORY_CARE_PROVIDER_SITE_OTHER): Payer: BC Managed Care – PPO

## 2014-05-25 DIAGNOSIS — E538 Deficiency of other specified B group vitamins: Secondary | ICD-10-CM

## 2014-06-14 ENCOUNTER — Ambulatory Visit (INDEPENDENT_AMBULATORY_CARE_PROVIDER_SITE_OTHER): Payer: BC Managed Care – PPO | Admitting: Internal Medicine

## 2014-06-14 ENCOUNTER — Encounter: Payer: Self-pay | Admitting: Internal Medicine

## 2014-06-14 VITALS — BP 160/92 | HR 80 | Ht 70.5 in | Wt 215.4 lb

## 2014-06-14 DIAGNOSIS — K2270 Barrett's esophagus without dysplasia: Secondary | ICD-10-CM

## 2014-06-14 DIAGNOSIS — K760 Fatty (change of) liver, not elsewhere classified: Secondary | ICD-10-CM

## 2014-06-14 DIAGNOSIS — K219 Gastro-esophageal reflux disease without esophagitis: Secondary | ICD-10-CM

## 2014-06-14 DIAGNOSIS — K7689 Other specified diseases of liver: Secondary | ICD-10-CM

## 2014-06-14 MED ORDER — ESOMEPRAZOLE MAGNESIUM 40 MG PO CPDR: 40.0000 mg | DELAYED_RELEASE_CAPSULE | Freq: Every day | ORAL | Status: DC

## 2014-06-14 NOTE — Progress Notes (Signed)
HISTORY OF PRESENT ILLNESS:  Alex [name] is a 58 y.o. male with past medical history as listed below. He has been followed in this office by Dr. [name], until his retirement, for GERD complicated by short segment Barrett's esophagus and routine colonoscopy. The patient last underwent complete colonoscopy in April of 2010. Examination revealed marked left-sided diverticulosis but was otherwise normal. Followup in 10 years recommended. His last upper endoscopy, at that same time, revealed short segment Barrett's esophagus. Biopsies confirmed the same without dysplasia. Followup in 5 years recommended. He has continued on Nexium 40 mg daily. He last saw Dr. Sharlett Iles in December of 2014 and was doing well. He continues to do well without active GI symptoms. No dysphagia. He is also known to have fatty liver.  REVIEW OF SYSTEMS:  All non-GI ROS negative except for knee pain  Past Medical History  Diagnosis Date  . GERD (gastroesophageal reflux disease)   . Barrett's esophagus   . Hypertension   . B12 deficiency   . Homocystinemia   . Achilles tendinitis     PMH of  . Hyperlipidemia   . Nonspecific elevation of levels of transaminase or lactic acid dehydrogenase (LDH)     PMH of  . Fatty liver 10-16-2011    [place]  . Diverticulosis of colon (without mention of hemorrhage)   . Gilbert syndrome   . Hiatal hernia     Past Surgical History  Procedure Laterality Date  . Vasectomy    . Wisdom tooth extraction    . Upper gastrointestinal endoscopy      Dr Sharlett Iles; multiple  snce 1992  . Colonoscopy  2010    negative  . Knee arthroscopy      Social History Alex [name]  reports that he has never smoked. He has never used smokeless tobacco. He reports that he does not drink alcohol or use illicit drugs.  family history includes Diabetes in his sister; Esophageal cancer in his father; Heart attack (age of onset: 76) in his mother; Heart disease in his father;  Hypertension in his father and mother; Stomach cancer in his father. There is no history of Stroke.  No Known Allergies     PHYSICAL EXAMINATION: Vital signs: BP 160/92  Pulse 80  Ht 5' 10.5" (1.791 m)  Wt 215 lb 6.4 oz (97.705 kg)  BMI 30.46 kg/m2 General: Well-developed, well-nourished, no acute distress HEENT: Sclerae are anicteric, conjunctiva pink. Oral mucosa intact Lungs: Clear Heart: Regular Abdomen: soft, nontender, nondistended, no obvious ascites, no peritoneal signs, normal bowel sounds. No organomegaly. Extremities: No edema Psychiatric: alert and oriented x3. Cooperative   ASSESSMENT:  #1. GERD complicated by short segment Barrett's esophagus. Nondysplastic. Last EGD 2010. Due for followup #2. Screening colonoscopy 2010 negative for neoplasia. #3. Fatty liver. History of   PLAN:  #1. Continue PPI. Refilled today #2. Upper endoscopy with biopsies.The nature of the procedure, as well as the risks, benefits, and alternatives were carefully and thoroughly reviewed with the patient. Ample time for discussion and questions allowed. The patient understood, was satisfied, and agreed to proceed. #3. Exercise weight loss #4. Repeat screening colonoscopy in 2020 unless otherwise clinically indicated

## 2014-06-14 NOTE — Patient Instructions (Signed)
  We have sent the following medications to your pharmacy for you to pick up at your convenience: Nexium   You have been scheduled for an endoscopy. Please follow written instructions given to you at your visit today. If you use inhalers (even only as needed), please bring them with you on the day of your procedure. Your physician has requested that you go to www.startemmi.com and enter the access code given to you at your visit today. This web site gives a general overview about your procedure. However, you should still follow specific instructions given to you by our office regarding your preparation for the procedure.

## 2014-06-21 ENCOUNTER — Encounter: Payer: Self-pay | Admitting: Internal Medicine

## 2014-06-29 ENCOUNTER — Ambulatory Visit (INDEPENDENT_AMBULATORY_CARE_PROVIDER_SITE_OTHER): Payer: BC Managed Care – PPO

## 2014-06-29 DIAGNOSIS — E538 Deficiency of other specified B group vitamins: Secondary | ICD-10-CM

## 2014-06-29 MED ORDER — CYANOCOBALAMIN 1000 MCG/ML IJ SOLN
1000.0000 ug | Freq: Once | INTRAMUSCULAR | Status: AC
  Administered 2014-06-29: 1000 ug via INTRAMUSCULAR

## 2014-07-28 ENCOUNTER — Encounter: Payer: Self-pay | Admitting: Internal Medicine

## 2014-07-28 ENCOUNTER — Ambulatory Visit (AMBULATORY_SURGERY_CENTER): Payer: BC Managed Care – PPO | Admitting: Internal Medicine

## 2014-07-28 VITALS — BP 131/84 | HR 68 | Temp 97.2°F | Resp 17 | Ht 70.5 in | Wt 215.0 lb

## 2014-07-28 DIAGNOSIS — K2270 Barrett's esophagus without dysplasia: Secondary | ICD-10-CM

## 2014-07-28 MED ORDER — SODIUM CHLORIDE 0.9 % IV SOLN: 500.0000 mL | INTRAVENOUS | Status: DC

## 2014-07-28 NOTE — Progress Notes (Signed)
Reports to rn, patient awakening, vss

## 2014-07-28 NOTE — Op Note (Signed)
[facility]  [address] [place], [id number]   ENDOSCOPY PROCEDURE REPORT  PATIENT: Alex, [name]  MR#: [id number] BIRTHDATE: 1956/02/18 , 58  yrs. old GENDER: male ENDOSCOPIST: Eustace Quail, MD REFERRED BY:  Surveillance Program Recall PROCEDURE DATE:  07/28/2014 PROCEDURE:  EGD w/ biopsy ASA CLASS:     Class II INDICATIONS:  history of Barrett's esophagus.  Last exam 2010 with non-dysplastic Barrett's MEDICATIONS: Monitored anesthesia care and Propofol 300 mg IV TOPICAL ANESTHETIC: none  DESCRIPTION OF PROCEDURE: After the risks benefits and alternatives of the procedure were thoroughly explained, informed consent was obtained.  The LB [id number] endoscope was introduced through the mouth and advanced to the second portion of the duodenum , Without limitations.  The instrument was slowly withdrawn as the mucosa was fully examined.  EXAMThe esophagus revealed eccentric short segment Barrett's esophagus (C0, M2).  This was examined with both white light and narrowed band imaging, with and without magnification..  No irregularities or inflammation present.The stomach was normal.  The duodenum was normal.  Retroflexed views revealed no abnormalities. The scope was then withdrawn from the patient and the procedure completed.  COMPLICATIONS: There were no immediate complications.  ENDOSCOPIC IMPRESSION: 1. Short segment Barrett's esophagus status post biopsies 2. Otherwise normal EGD 3. GERD  RECOMMENDATIONS: 1.  Continue PPI 2.  REPEAT SURVEILLANCE EGD IN 5 YEARS   , if no dysplasia.  REPEAT EXAM:  eSigned:  Eustace Quail, MD 07/28/2014 3:10 PM    [id number] Chanda Busing, MD and The Patient

## 2014-07-28 NOTE — Progress Notes (Signed)
Called to room to assist during endoscopic procedure.  Patient ID and intended procedure confirmed with present staff. Received instructions for my participation in the procedure from the performing physician.  

## 2014-07-28 NOTE — Patient Instructions (Addendum)
YOU HAD AN ENDOSCOPIC PROCEDURE TODAY AT [facility]: Refer to the procedure report that was given to you for any specific questions about what was found during the examination.  If the procedure report does not answer your questions, please call your gastroenterologist to clarify.  If you requested that your care partner not be given the details of your procedure findings, then the procedure report has been included in a sealed envelope for you to review at your convenience later.  YOU SHOULD EXPECT: Some feelings of bloating in the abdomen. Passage of more gas than usual.  Walking can help get rid of the air that was put into your GI tract during the procedure and reduce the bloating. If you had a lower endoscopy (such as a colonoscopy or flexible sigmoidoscopy) you may notice spotting of blood in your stool or on the toilet paper. If you underwent a bowel prep for your procedure, then you may not have a normal bowel movement for a few days.  DIET: Your first meal following the procedure should be a light meal and then it is ok to progress to your normal diet.  A half-sandwich or bowl of soup is an example of a good first meal.  Heavy or fried foods are harder to digest and may make you feel nauseous or bloated.  Likewise meals heavy in dairy and vegetables can cause extra gas to form and this can also increase the bloating.  Drink plenty of fluids but you should avoid alcoholic beverages for 24 hours.  ACTIVITY: Your care partner should take you home directly after the procedure.  You should plan to take it easy, moving slowly for the rest of the day.  You can resume normal activity the day after the procedure however you should NOT DRIVE or use heavy machinery for 24 hours (because of the sedation medicines used during the test).    SYMPTOMS TO REPORT IMMEDIATELY: A gastroenterologist can be reached at any hour.  During normal business hours, 8:30 AM to 5:00 PM Monday through Friday,  call [phone].  After hours and on weekends, please call the GI answering service at [phone] who will take a message and have the physician on call contact you.   Following upper endoscopy (EGD)  Vomiting of blood or coffee ground material  New chest pain or pain under the shoulder blades  Painful or persistently difficult swallowing  New shortness of breath  Fever of 100F or higher  Black, tarry-looking stools  FOLLOW UP: If any biopsies were taken you will be contacted by phone or by letter within the next 1-3 weeks.  Call your gastroenterologist if you have not heard about the biopsies in 3 weeks.  Our staff will call the home number listed on your records the next business day following your procedure to check on you and address any questions or concerns that you may have at that time regarding the information given to you following your procedure. This is a courtesy call and so if there is no answer at the home number and we have not heard from you through the emergency physician on call, we will assume that you have returned to your regular daily activities without incident.  SIGNATURES/CONFIDENTIALITY: You and/or your care partner have signed paperwork which will be entered into your electronic medical record.  These signatures attest to the fact that that the information above on your After Visit Summary has been reviewed and is understood.  Full responsibility  of the confidentiality of this discharge information lies with you and/or your care-partner.  Wait biospy results.  GERD-handout given  Repeat EGD in 5 years-2020 for surveillance  Continue nexium.

## 2014-07-29 ENCOUNTER — Telehealth: Payer: Self-pay | Admitting: *Deleted

## 2014-07-29 NOTE — Telephone Encounter (Signed)
  Follow up Call-  Call back number 07/28/2014  Post procedure Call Back phone  # [phone]  Permission to leave phone message Yes     Patient questions:  Do you have a fever, pain , or abdominal swelling? No. Pain Score  0 *  Have you tolerated food without any problems? Yes.    Have you been able to return to your normal activities? Yes.    Do you have any questions about your discharge instructions: Diet   No. Medications  No. Follow up visit  No.  Do you have questions or concerns about your Care? No.  Actions: * If pain score is 4 or above: No action needed, pain <4.

## 2014-08-03 ENCOUNTER — Ambulatory Visit (INDEPENDENT_AMBULATORY_CARE_PROVIDER_SITE_OTHER): Payer: BC Managed Care – PPO | Admitting: Geriatric Medicine

## 2014-08-03 ENCOUNTER — Encounter: Payer: Self-pay | Admitting: Internal Medicine

## 2014-08-03 DIAGNOSIS — E538 Deficiency of other specified B group vitamins: Secondary | ICD-10-CM

## 2014-08-03 MED ORDER — CYANOCOBALAMIN 1000 MCG/ML IJ SOLN: 1000.0000 ug | Freq: Once | INTRAMUSCULAR | Status: DC

## 2014-08-12 ENCOUNTER — Other Ambulatory Visit: Payer: Self-pay

## 2014-09-07 ENCOUNTER — Ambulatory Visit (INDEPENDENT_AMBULATORY_CARE_PROVIDER_SITE_OTHER): Payer: BC Managed Care – PPO

## 2014-09-07 DIAGNOSIS — E538 Deficiency of other specified B group vitamins: Secondary | ICD-10-CM

## 2014-09-07 MED ORDER — CYANOCOBALAMIN 1000 MCG/ML IJ SOLN
1000.0000 ug | Freq: Once | INTRAMUSCULAR | Status: AC
  Administered 2014-09-07: 1000 ug via INTRAMUSCULAR

## 2014-10-12 ENCOUNTER — Ambulatory Visit (INDEPENDENT_AMBULATORY_CARE_PROVIDER_SITE_OTHER): Payer: BC Managed Care – PPO | Admitting: *Deleted

## 2014-10-12 DIAGNOSIS — E538 Deficiency of other specified B group vitamins: Secondary | ICD-10-CM

## 2014-10-12 MED ORDER — CYANOCOBALAMIN 1000 MCG/ML IJ SOLN
1000.0000 ug | Freq: Once | INTRAMUSCULAR | Status: AC
  Administered 2014-10-12: 1000 ug via INTRAMUSCULAR

## 2014-11-16 ENCOUNTER — Ambulatory Visit (INDEPENDENT_AMBULATORY_CARE_PROVIDER_SITE_OTHER): Payer: BLUE CROSS/BLUE SHIELD

## 2014-11-16 DIAGNOSIS — E538 Deficiency of other specified B group vitamins: Secondary | ICD-10-CM

## 2014-11-16 MED ORDER — CYANOCOBALAMIN 1000 MCG/ML IJ SOLN
1000.0000 ug | INTRAMUSCULAR | Status: DC
  Administered 2016-08-14 – 2017-03-19 (×3): 1000 ug via INTRAMUSCULAR

## 2014-12-19 ENCOUNTER — Other Ambulatory Visit: Payer: Self-pay

## 2014-12-19 DIAGNOSIS — I10 Essential (primary) hypertension: Secondary | ICD-10-CM

## 2014-12-19 MED ORDER — DILTIAZEM HCL ER COATED BEADS 240 MG PO CP24: ORAL_CAPSULE | ORAL | Status: DC

## 2014-12-19 MED ORDER — HYDROCHLOROTHIAZIDE 12.5 MG PO CAPS: ORAL_CAPSULE | ORAL | Status: DC

## 2014-12-21 ENCOUNTER — Ambulatory Visit: Payer: Self-pay

## 2014-12-21 ENCOUNTER — Ambulatory Visit (INDEPENDENT_AMBULATORY_CARE_PROVIDER_SITE_OTHER): Payer: BLUE CROSS/BLUE SHIELD | Admitting: *Deleted

## 2014-12-21 DIAGNOSIS — E538 Deficiency of other specified B group vitamins: Secondary | ICD-10-CM

## 2014-12-21 MED ORDER — CYANOCOBALAMIN 1000 MCG/ML IJ SOLN
1000.0000 ug | Freq: Once | INTRAMUSCULAR | Status: AC
  Administered 2014-12-21: 1000 ug via INTRAMUSCULAR

## 2015-01-05 ENCOUNTER — Encounter: Payer: Self-pay | Admitting: Internal Medicine

## 2015-01-11 ENCOUNTER — Other Ambulatory Visit: Payer: Self-pay | Admitting: Internal Medicine

## 2015-01-11 ENCOUNTER — Other Ambulatory Visit (INDEPENDENT_AMBULATORY_CARE_PROVIDER_SITE_OTHER): Payer: BLUE CROSS/BLUE SHIELD

## 2015-01-11 ENCOUNTER — Encounter: Payer: Self-pay | Admitting: Internal Medicine

## 2015-01-11 ENCOUNTER — Ambulatory Visit (INDEPENDENT_AMBULATORY_CARE_PROVIDER_SITE_OTHER): Payer: BLUE CROSS/BLUE SHIELD | Admitting: Internal Medicine

## 2015-01-11 VITALS — BP 142/90 | HR 91 | Temp 97.9°F | Resp 15 | Ht 70.0 in | Wt 203.2 lb

## 2015-01-11 DIAGNOSIS — E538 Deficiency of other specified B group vitamins: Secondary | ICD-10-CM

## 2015-01-11 DIAGNOSIS — E785 Hyperlipidemia, unspecified: Secondary | ICD-10-CM

## 2015-01-11 DIAGNOSIS — Z0000 Encounter for general adult medical examination without abnormal findings: Secondary | ICD-10-CM

## 2015-01-11 DIAGNOSIS — I10 Essential (primary) hypertension: Secondary | ICD-10-CM

## 2015-01-11 DIAGNOSIS — E876 Hypokalemia: Secondary | ICD-10-CM

## 2015-01-11 DIAGNOSIS — Z0189 Encounter for other specified special examinations: Secondary | ICD-10-CM

## 2015-01-11 LAB — HEPATIC FUNCTION PANEL
ALT: 44 U/L (ref 0–53)
AST: 32 U/L (ref 0–37)
Albumin: 4 g/dL (ref 3.5–5.2)
Alkaline Phosphatase: 63 U/L (ref 39–117)
Bilirubin, Direct: 0.1 mg/dL (ref 0.0–0.3)
Total Bilirubin: 0.7 mg/dL (ref 0.2–1.2)
Total Protein: 6.4 g/dL (ref 6.0–8.3)

## 2015-01-11 LAB — BASIC METABOLIC PANEL
BUN: 15 mg/dL (ref 6–23)
CO2: 30 mEq/L (ref 19–32)
Calcium: 8.8 mg/dL (ref 8.4–10.5)
Chloride: 103 mEq/L (ref 96–112)
Creatinine, Ser: 0.91 mg/dL (ref 0.40–1.50)
GFR: 91.06 mL/min (ref >60.00)
Glucose, Bld: 99 mg/dL (ref 70–99)
Potassium: 3.3 mEq/L — ABNORMAL LOW (ref 3.5–5.1)
Sodium: 139 mEq/L (ref 135–145)

## 2015-01-11 LAB — VITAMIN B12: Vitamin B-12: 363 pg/mL (ref 211–911)

## 2015-01-11 LAB — CBC WITH DIFFERENTIAL/PLATELET
Basophils Absolute: 0.1 10*3/uL (ref 0.0–0.1)
Basophils Relative: 0.9 % (ref 0.0–3.0)
Eosinophils Absolute: 1.2 10*3/uL — ABNORMAL HIGH (ref 0.0–0.7)
Eosinophils Relative: 15.3 % — ABNORMAL HIGH (ref 0.0–5.0)
HCT: 42.6 % (ref 39.0–52.0)
Hemoglobin: 14.8 g/dL (ref 13.0–17.0)
Lymphocytes Relative: 17.9 % (ref 12.0–46.0)
Lymphs Abs: 1.4 10*3/uL (ref 0.7–4.0)
MCHC: 34.8 g/dL (ref 30.0–36.0)
MCV: 92.1 fl (ref 78.0–100.0)
Monocytes Absolute: 0.5 10*3/uL (ref 0.1–1.0)
Monocytes Relative: 6 % (ref 3.0–12.0)
Neutro Abs: 4.6 10*3/uL (ref 1.4–7.7)
Neutrophils Relative %: 59.9 % (ref 43.0–77.0)
Platelets: 189 10*3/uL (ref 150.0–400.0)
RBC: 4.62 Mil/uL (ref 4.22–5.81)
RDW: 12.8 % (ref 11.5–15.5)
WBC: 7.6 10*3/uL (ref 4.0–10.5)

## 2015-01-11 LAB — TSH: TSH: 1.73 u[IU]/mL (ref 0.35–4.50)

## 2015-01-11 MED ORDER — LOSARTAN POTASSIUM 50 MG PO TABS
50.0000 mg | ORAL_TABLET | Freq: Every day | ORAL | Status: DC
Start: 2015-01-11 — End: 2016-01-17

## 2015-01-11 NOTE — Progress Notes (Signed)
Pre visit review using our clinic review tool, if applicable. No additional management support is needed unless otherwise documented below in the visit note. 

## 2015-01-11 NOTE — Patient Instructions (Signed)
  Your next office appointment will be determined based upon review of your pending labs. Those instructions will be transmitted to you by mail.  Critical values will be called. Followup as needed for any active or acute issue. Please report any significant change in your symptoms.  Minimal Blood Pressure Goal= AVERAGE < 140/90;  Ideal is an AVERAGE < 135/85. This AVERAGE should be calculated from @ least 5-7 BP readings taken @ different times of day on different days of week. You should not respond to isolated BP readings , but rather the AVERAGE for that week .Please bring your  blood pressure cuff to office visits to verify that it is reliable.It  can also be checked against the blood pressure device at the pharmacy. Finger or wrist cuffs are not dependable; an arm cuff is.

## 2015-01-11 NOTE — Progress Notes (Signed)
   Subjective:    Patient ID: Alex [name], male    DOB: [birth year], 59 y.o.   MRN: [id number]  HPI  He is here for a physical;acute issues include DJD of knees. S/P arthroscopy R knee 3/15 & L 2/16.  He is on a modified heart healthy, low-salt diet. Exercise is sporadic. He does not have any cardiopulmonary symptoms with exercise.  BP not monitored @ home.  There is no family history of premature heart attack or stroke. Advanced cholesterol testing reveals his LDL goal is less than 110. He has not taken statin to date  He did have some queasy abdominal discomfort earlier this year which resolved with avoiding salad intake. His endoscopy and colonoscopy are up-to-date ;he has no other GI symptoms at this time.   Review of Systems  Chest pain, palpitations, tachycardia, exertional dyspnea, paroxysmal nocturnal dyspnea, claudication or edema are absent.  Unexplained weight loss, abdominal pain, significant dyspepsia, dysphagia, melena, rectal bleeding, or persistently small caliber stools are denied nowx.    Objective:   Physical Exam  Gen.: Adequately nourished in appearance. Alert, appropriate and cooperative throughout exam. Appears younger than stated age  Head: Normocephalic without obvious abnormalities; pattern alopecia  Eyes: No corneal or conjunctival inflammation noted. Pupils equal round reactive to light and accommodation. Extraocular motion intact.  Ears: External  ear exam reveals no significant lesions or deformities. Canals clear .TMs normal. Hearing is grossly normal bilaterally. Nose: External nasal exam reveals no deformity or inflammation. Nasal mucosa are pink and moist. No lesions or exudates noted.   Mouth: Oral mucosa and oropharynx reveal no lesions or exudates. Teeth in good repair. Neck: No deformities, masses, or tenderness noted. Range of motion & Thyroid normal. Lungs: Normal respiratory effort; chest expands symmetrically. Lungs are clear to  auscultation without rales, wheezes, or increased work of breathing. Heart: Normal rate and rhythm. Normal S1 and S2. No gallop, click, or rub. No murmur. Abdomen: Bowel sounds normal; abdomen soft and nontender. No masses, organomegaly or hernias noted. Genitalia: Genitalia normal except for left varices. Prostate is normal without enlargement, asymmetry, nodularity, or induration   Musculoskeletal/extremities: No deformity or scoliosis noted of  the thoracic or lumbar spine.  No clubbing, cyanosis, edema, or significant extremity  deformity noted.  Range of motion normal . Tone & strength normal. Hand joints reveal minor  DJD DIP changes.  Fingernail  health good. Fusiform changes & marked crepitus of knees . Some valgus changes. Able to lie down & sit up w/o help.  Negative SLR bilaterally Vascular: Carotid, radial artery, dorsalis pedis and  posterior tibial pulses are full and equal. No bruits present. Neurologic: Alert and oriented x3. Deep tendon reflexes symmetrical and normal.  Gait normal      Skin: Intact without suspicious lesions or rashes. Lymph: No cervical, axillary, or inguinal lymphadenopathy present. Psych: Mood and affect are normal. Normally interactive                                                                                    Assessment & Plan:  #1 comprehensive physical exam; no acute findings  Plan: see Orders  & Recommendations

## 2015-01-13 LAB — NMR LIPOPROFILE WITH LIPIDS
Cholesterol, Total: 150 mg/dL (ref 100–199)
HDL Particle Number: 24.6 umol/L — ABNORMAL LOW (ref >=30.5)
HDL Size: 8.8 nm — ABNORMAL LOW (ref >=9.2)
HDL-C: 34 mg/dL — ABNORMAL LOW (ref >39)
LDL (calc): 97 mg/dL (ref 0–99)
LDL Particle Number: 1391 nmol/L — ABNORMAL HIGH (ref <1000)
LDL Size: 20.6 nm (ref >=20.8)
LP-IR Score: 40 (ref <=45)
Large HDL-P: 3.7 umol/L — ABNORMAL LOW (ref >=4.8)
Large VLDL-P: 1.1 nmol/L (ref <=2.7)
Small LDL Particle Number: 645 nmol/L — ABNORMAL HIGH (ref <=527)
Triglycerides: 95 mg/dL (ref 0–149)
VLDL Size: 39.6 nm (ref <=46.6)

## 2015-01-14 ENCOUNTER — Other Ambulatory Visit: Payer: Self-pay | Admitting: Internal Medicine

## 2015-01-14 DIAGNOSIS — E876 Hypokalemia: Secondary | ICD-10-CM | POA: Insufficient documentation

## 2015-01-25 ENCOUNTER — Ambulatory Visit (INDEPENDENT_AMBULATORY_CARE_PROVIDER_SITE_OTHER): Payer: BLUE CROSS/BLUE SHIELD

## 2015-01-25 DIAGNOSIS — E538 Deficiency of other specified B group vitamins: Secondary | ICD-10-CM | POA: Diagnosis not present

## 2015-01-25 MED ORDER — CYANOCOBALAMIN 1000 MCG/ML IJ SOLN
1000.0000 ug | Freq: Once | INTRAMUSCULAR | Status: AC
  Administered 2015-01-25: 1000 ug via INTRAMUSCULAR

## 2015-02-07 ENCOUNTER — Telehealth: Payer: Self-pay | Admitting: *Deleted

## 2015-02-07 NOTE — Telephone Encounter (Signed)
Left msg on triage stating he never received lab letter from his cpx. Called pt back inform him md release results to [place] system. Did relay md response on labs. Pt is also wanting copy mailed to his address. Mailed copy...Alex [name]

## 2015-03-01 ENCOUNTER — Ambulatory Visit (INDEPENDENT_AMBULATORY_CARE_PROVIDER_SITE_OTHER): Payer: BLUE CROSS/BLUE SHIELD | Admitting: Geriatric Medicine

## 2015-03-01 DIAGNOSIS — E538 Deficiency of other specified B group vitamins: Secondary | ICD-10-CM | POA: Diagnosis not present

## 2015-03-01 MED ORDER — CYANOCOBALAMIN 1000 MCG/ML IJ SOLN
1000.0000 ug | Freq: Once | INTRAMUSCULAR | Status: AC
  Administered 2015-03-01: 1000 ug via INTRAMUSCULAR

## 2015-04-05 ENCOUNTER — Ambulatory Visit (INDEPENDENT_AMBULATORY_CARE_PROVIDER_SITE_OTHER): Payer: BLUE CROSS/BLUE SHIELD | Admitting: *Deleted

## 2015-04-05 DIAGNOSIS — E538 Deficiency of other specified B group vitamins: Secondary | ICD-10-CM | POA: Diagnosis not present

## 2015-04-05 MED ORDER — CYANOCOBALAMIN 1000 MCG/ML IJ SOLN
1000.0000 ug | Freq: Once | INTRAMUSCULAR | Status: AC
  Administered 2015-04-05: 1000 ug via INTRAMUSCULAR

## 2015-05-10 ENCOUNTER — Ambulatory Visit (INDEPENDENT_AMBULATORY_CARE_PROVIDER_SITE_OTHER): Payer: BLUE CROSS/BLUE SHIELD

## 2015-05-10 DIAGNOSIS — E538 Deficiency of other specified B group vitamins: Secondary | ICD-10-CM | POA: Diagnosis not present

## 2015-05-10 MED ORDER — CYANOCOBALAMIN 1000 MCG/ML IJ SOLN
1000.0000 ug | Freq: Once | INTRAMUSCULAR | Status: AC
  Administered 2015-05-10: 1000 ug via INTRAMUSCULAR

## 2015-06-15 ENCOUNTER — Ambulatory Visit (INDEPENDENT_AMBULATORY_CARE_PROVIDER_SITE_OTHER): Payer: BLUE CROSS/BLUE SHIELD | Admitting: Emergency Medicine

## 2015-06-15 DIAGNOSIS — E538 Deficiency of other specified B group vitamins: Secondary | ICD-10-CM | POA: Diagnosis not present

## 2015-06-15 MED ORDER — CYANOCOBALAMIN 1000 MCG/ML IJ SOLN
1000.0000 ug | Freq: Once | INTRAMUSCULAR | Status: AC
  Administered 2015-06-15: 1000 ug via INTRAMUSCULAR

## 2015-07-10 ENCOUNTER — Other Ambulatory Visit: Payer: Self-pay | Admitting: Internal Medicine

## 2015-07-19 ENCOUNTER — Ambulatory Visit (INDEPENDENT_AMBULATORY_CARE_PROVIDER_SITE_OTHER): Payer: BLUE CROSS/BLUE SHIELD

## 2015-07-19 DIAGNOSIS — E538 Deficiency of other specified B group vitamins: Secondary | ICD-10-CM

## 2015-07-19 MED ORDER — CYANOCOBALAMIN 1000 MCG/ML IJ SOLN
1000.0000 ug | Freq: Once | INTRAMUSCULAR | Status: AC
  Administered 2015-07-19: 1000 ug via INTRAMUSCULAR

## 2015-08-23 ENCOUNTER — Ambulatory Visit (INDEPENDENT_AMBULATORY_CARE_PROVIDER_SITE_OTHER): Payer: BLUE CROSS/BLUE SHIELD

## 2015-08-23 DIAGNOSIS — E538 Deficiency of other specified B group vitamins: Secondary | ICD-10-CM | POA: Diagnosis not present

## 2015-08-23 MED ORDER — CYANOCOBALAMIN 1000 MCG/ML IJ SOLN
1000.0000 ug | Freq: Once | INTRAMUSCULAR | Status: AC
  Administered 2015-08-23: 1000 ug via INTRAMUSCULAR

## 2015-09-27 ENCOUNTER — Telehealth: Payer: Self-pay | Admitting: Internal Medicine

## 2015-09-27 ENCOUNTER — Encounter: Payer: Self-pay | Admitting: Gastroenterology

## 2015-09-27 ENCOUNTER — Ambulatory Visit (INDEPENDENT_AMBULATORY_CARE_PROVIDER_SITE_OTHER): Payer: BLUE CROSS/BLUE SHIELD

## 2015-09-27 DIAGNOSIS — E538 Deficiency of other specified B group vitamins: Secondary | ICD-10-CM

## 2015-09-27 MED ORDER — CYANOCOBALAMIN 1000 MCG/ML IJ SOLN
1000.0000 ug | Freq: Once | INTRAMUSCULAR | Status: AC
  Administered 2015-09-27: 1000 ug via INTRAMUSCULAR

## 2015-09-27 NOTE — Telephone Encounter (Signed)
Ok with me 

## 2015-09-27 NOTE — Telephone Encounter (Signed)
Patient is requesting to establish with Dr. Ronnald Ramp.  Please advise.

## 2015-09-28 NOTE — Telephone Encounter (Signed)
Got scheduled  °

## 2015-10-10 ENCOUNTER — Telehealth: Payer: Self-pay

## 2015-10-10 NOTE — Telephone Encounter (Signed)
Left Voice Mail for pt to call back.   RE: Flu Vaccine for 2016  

## 2015-11-08 ENCOUNTER — Ambulatory Visit (INDEPENDENT_AMBULATORY_CARE_PROVIDER_SITE_OTHER): Payer: BLUE CROSS/BLUE SHIELD

## 2015-11-08 DIAGNOSIS — E538 Deficiency of other specified B group vitamins: Secondary | ICD-10-CM

## 2015-11-08 MED ORDER — CYANOCOBALAMIN 1000 MCG/ML IJ SOLN
1000.0000 ug | Freq: Once | INTRAMUSCULAR | Status: AC
  Administered 2015-11-08: 1000 ug via INTRAMUSCULAR

## 2015-12-13 ENCOUNTER — Ambulatory Visit (INDEPENDENT_AMBULATORY_CARE_PROVIDER_SITE_OTHER): Payer: BLUE CROSS/BLUE SHIELD

## 2015-12-13 DIAGNOSIS — E538 Deficiency of other specified B group vitamins: Secondary | ICD-10-CM

## 2015-12-13 MED ORDER — CYANOCOBALAMIN 1000 MCG/ML IJ SOLN
1000.0000 ug | Freq: Once | INTRAMUSCULAR | Status: AC
  Administered 2015-12-13: 1000 ug via INTRAMUSCULAR

## 2015-12-14 ENCOUNTER — Other Ambulatory Visit: Payer: Self-pay

## 2015-12-14 DIAGNOSIS — I10 Essential (primary) hypertension: Secondary | ICD-10-CM

## 2015-12-14 MED ORDER — HYDROCHLOROTHIAZIDE 12.5 MG PO CAPS: ORAL_CAPSULE | ORAL | Status: DC

## 2015-12-21 ENCOUNTER — Other Ambulatory Visit: Payer: Self-pay

## 2015-12-21 DIAGNOSIS — I10 Essential (primary) hypertension: Secondary | ICD-10-CM

## 2015-12-21 MED ORDER — DILTIAZEM HCL ER COATED BEADS 240 MG PO CP24: ORAL_CAPSULE | ORAL | Status: DC

## 2016-01-11 ENCOUNTER — Telehealth: Payer: Self-pay

## 2016-01-11 ENCOUNTER — Ambulatory Visit (INDEPENDENT_AMBULATORY_CARE_PROVIDER_SITE_OTHER): Payer: BLUE CROSS/BLUE SHIELD

## 2016-01-11 DIAGNOSIS — E538 Deficiency of other specified B group vitamins: Secondary | ICD-10-CM | POA: Diagnosis not present

## 2016-01-11 MED ORDER — CYANOCOBALAMIN 1000 MCG/ML IJ SOLN
1000.0000 ug | Freq: Once | INTRAMUSCULAR | Status: AC
  Administered 2016-01-11: 1000 ug via INTRAMUSCULAR

## 2016-01-11 NOTE — Telephone Encounter (Signed)
LVM for pt to call back as soon as possible.   RE: Flu Vaccine 2016-2017

## 2016-01-15 ENCOUNTER — Telehealth: Payer: Self-pay | Admitting: Internal Medicine

## 2016-01-15 MED ORDER — ESOMEPRAZOLE MAGNESIUM 40 MG PO CPDR: DELAYED_RELEASE_CAPSULE | ORAL | Status: DC

## 2016-01-15 NOTE — Telephone Encounter (Signed)
Refilled Nexium 

## 2016-01-17 ENCOUNTER — Encounter: Payer: Self-pay | Admitting: Internal Medicine

## 2016-01-17 ENCOUNTER — Other Ambulatory Visit (INDEPENDENT_AMBULATORY_CARE_PROVIDER_SITE_OTHER): Payer: BLUE CROSS/BLUE SHIELD

## 2016-01-17 ENCOUNTER — Ambulatory Visit (INDEPENDENT_AMBULATORY_CARE_PROVIDER_SITE_OTHER): Payer: BLUE CROSS/BLUE SHIELD | Admitting: Internal Medicine

## 2016-01-17 VITALS — BP 124/84 | HR 78 | Temp 97.9°F | Resp 16 | Ht 70.0 in | Wt 214.0 lb

## 2016-01-17 DIAGNOSIS — E785 Hyperlipidemia, unspecified: Secondary | ICD-10-CM

## 2016-01-17 DIAGNOSIS — Z0000 Encounter for general adult medical examination without abnormal findings: Secondary | ICD-10-CM | POA: Diagnosis not present

## 2016-01-17 DIAGNOSIS — E876 Hypokalemia: Secondary | ICD-10-CM

## 2016-01-17 DIAGNOSIS — E538 Deficiency of other specified B group vitamins: Secondary | ICD-10-CM

## 2016-01-17 DIAGNOSIS — N401 Enlarged prostate with lower urinary tract symptoms: Secondary | ICD-10-CM

## 2016-01-17 DIAGNOSIS — I10 Essential (primary) hypertension: Secondary | ICD-10-CM

## 2016-01-17 DIAGNOSIS — R351 Nocturia: Secondary | ICD-10-CM

## 2016-01-17 LAB — URINALYSIS, ROUTINE W REFLEX MICROSCOPIC
Bilirubin Urine: NEGATIVE
Ketones, ur: NEGATIVE
Leukocytes, UA: NEGATIVE
Nitrite: NEGATIVE
Specific Gravity, Urine: 1.02 (ref 1.000–1.030)
Total Protein, Urine: NEGATIVE
Urine Glucose: NEGATIVE
Urobilinogen, UA: 0.2 (ref 0.0–1.0)
pH: 6.5 (ref 5.0–8.0)

## 2016-01-17 LAB — CBC WITH DIFFERENTIAL/PLATELET
Basophils Absolute: 0 10*3/uL (ref 0.0–0.1)
Basophils Relative: 0.7 % (ref 0.0–3.0)
Eosinophils Absolute: 0.1 10*3/uL (ref 0.0–0.7)
Eosinophils Relative: 1.4 % (ref 0.0–5.0)
HCT: 47.6 % (ref 39.0–52.0)
Hemoglobin: 16.3 g/dL (ref 13.0–17.0)
Lymphocytes Relative: 23.4 % (ref 12.0–46.0)
Lymphs Abs: 1.3 10*3/uL (ref 0.7–4.0)
MCHC: 34.3 g/dL (ref 30.0–36.0)
MCV: 93.4 fl (ref 78.0–100.0)
Monocytes Absolute: 0.5 10*3/uL (ref 0.1–1.0)
Monocytes Relative: 8.6 % (ref 3.0–12.0)
Neutro Abs: 3.8 10*3/uL (ref 1.4–7.7)
Neutrophils Relative %: 65.9 % (ref 43.0–77.0)
Platelets: 163 10*3/uL (ref 150.0–400.0)
RBC: 5.1 Mil/uL (ref 4.22–5.81)
RDW: 12.9 % (ref 11.5–15.5)
WBC: 5.7 10*3/uL (ref 4.0–10.5)

## 2016-01-17 LAB — TSH: TSH: 2.3 u[IU]/mL (ref 0.35–4.50)

## 2016-01-17 LAB — COMPREHENSIVE METABOLIC PANEL
ALT: 57 U/L — ABNORMAL HIGH (ref 0–53)
AST: 33 U/L (ref 0–37)
Albumin: 4.5 g/dL (ref 3.5–5.2)
Alkaline Phosphatase: 51 U/L (ref 39–117)
BUN: 26 mg/dL — ABNORMAL HIGH (ref 6–23)
CO2: 28 mEq/L (ref 19–32)
Calcium: 9.2 mg/dL (ref 8.4–10.5)
Chloride: 104 mEq/L (ref 96–112)
Creatinine, Ser: 0.93 mg/dL (ref 0.40–1.50)
GFR: 88.49 mL/min (ref >60.00)
Glucose, Bld: 111 mg/dL — ABNORMAL HIGH (ref 70–99)
Potassium: 3.7 mEq/L (ref 3.5–5.1)
Sodium: 139 mEq/L (ref 135–145)
Total Bilirubin: 1.2 mg/dL (ref 0.2–1.2)
Total Protein: 7.2 g/dL (ref 6.0–8.3)

## 2016-01-17 LAB — LIPID PANEL
Cholesterol: 186 mg/dL (ref 0–200)
HDL: 42.5 mg/dL (ref >39.00)
LDL Cholesterol: 125 mg/dL — ABNORMAL HIGH (ref 0–99)
NonHDL: 143.32
Total CHOL/HDL Ratio: 4
Triglycerides: 93 mg/dL (ref 0.0–149.0)
VLDL: 18.6 mg/dL (ref 0.0–40.0)

## 2016-01-17 LAB — FOLATE: Folate: 14.7 ng/mL (ref >5.9)

## 2016-01-17 LAB — PSA: PSA: 0.82 ng/mL (ref 0.10–4.00)

## 2016-01-17 LAB — VITAMIN B12: Vitamin B-12: 398 pg/mL (ref 211–911)

## 2016-01-17 LAB — MAGNESIUM: Magnesium: 2.2 mg/dL (ref 1.5–2.5)

## 2016-01-17 LAB — FECAL OCCULT BLOOD, GUAIAC: Fecal Occult Blood: NEGATIVE

## 2016-01-17 MED ORDER — LOSARTAN POTASSIUM 50 MG PO TABS: 50.0000 mg | ORAL_TABLET | Freq: Every day | ORAL | Status: DC

## 2016-01-17 MED ORDER — HYDROCHLOROTHIAZIDE 12.5 MG PO CAPS: ORAL_CAPSULE | ORAL | Status: DC

## 2016-01-17 MED ORDER — DILTIAZEM HCL ER COATED BEADS 240 MG PO CP24: ORAL_CAPSULE | ORAL | Status: DC

## 2016-01-17 NOTE — Progress Notes (Signed)
Subjective:  Patient ID: Alex [name], male    DOB: 1956/02/07  Age: 60 y.o. MRN: [id number]  CC: Benign Prostatic Hypertrophy; Hypertension; and Annual Exam   HPI Alex [name] presents for a CPX. He is being treated for hypertension with a calcium channel blocker, hydrochlorothiazide, and losartan. He tells me his blood pressure has been well controlled, he is active and exercises and denies shortness of breath, chest pain, dyspnea on exertion, headache, blurred vision, edema, syncope, or fatigue. He has no side effects from any of his medications either.  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   He has past surgical history that includes Vasectomy; Wisdom tooth extraction; Upper gastrointestinal endoscopy; Colonoscopy (2010); and Knee arthroscopy.   His family history includes Esophageal cancer in his father; Heart attack (age of onset: 78) in his mother; Heart disease in his father; Hypertension in his father, mother, sister, sister, and sister; Stomach cancer in his father. There is no history of Stroke, Cancer, Diabetes, Early death, Hyperlipidemia, Kidney disease, or Alcohol abuse.He reports that he has never smoked. He has never used smokeless tobacco. He reports that he does not drink alcohol or use illicit drugs.  Outpatient Prescriptions Prior to Visit  Medication Sig Dispense Refill  . esomeprazole (NEXIUM) 40 MG capsule take 1 capsule by mouth every morning BEFORE BREAKFAST 30 capsule 6  . fexofenadine (ALLEGRA) 180 MG tablet Take 180 mg by mouth daily.      Marland Kitchen GLUCOSAMINE HCL PO Take by mouth.    . meloxicam  (MOBIC) 15 MG tablet Take 15 mg by mouth daily.    Marland Kitchen diltiazem (CARDIZEM CD) 240 MG 24 hr capsule take 1 capsule by mouth once daily 30 capsule 0  . hydrochlorothiazide (MICROZIDE) 12.5 MG capsule take 1 capsule by mouth once daily 30 capsule 0  . losartan (COZAAR) 50 MG tablet Take 1 tablet (50 mg total) by mouth daily. 90 tablet 3  . traMADol (ULTRAM) 50 MG tablet Take 50 mg by mouth daily as needed. Reported on 01/17/2016     Facility-Administered Medications Prior to Visit  Medication Dose Route Frequency Provider Last Rate Last Dose  . cyanocobalamin ((VITAMIN B-12)) injection 1,000 mcg  1,000 mcg Intramuscular Q30 days Hendricks Limes, MD        ROS Review of Systems  Constitutional: Negative.  Negative for fever, chills, diaphoresis, appetite change and fatigue.  HENT: Negative.   Eyes: Negative.   Respiratory: Negative.  Negative for cough, choking, chest tightness, shortness of breath and stridor.   Cardiovascular: Negative.  Negative for chest pain, palpitations and leg swelling.  Gastrointestinal: Negative.  Negative for nausea, vomiting, abdominal pain, diarrhea, constipation and blood in stool.  Endocrine: Negative.   Genitourinary: Negative.  Negative for dysuria, hematuria, flank pain, scrotal swelling, difficulty urinating and testicular pain.       He complains that he gets up about 2 or 3 times a night to urinate, this doesn't bother him very much and he does not want to treat it.  Musculoskeletal: Negative.  Negative for myalgias, back pain and arthralgias.  Skin: Negative.  Negative for color change and rash.  Allergic/Immunologic: Negative.   Neurological: Negative.  Negative for dizziness, tremors, weakness, light-headedness and numbness.  Hematological: Negative.  Negative for adenopathy. Does not bruise/bleed easily.  Psychiatric/Behavioral: Negative.     Objective:  BP 124/84 mmHg  Pulse 78  Temp(Src) 97.9 F (36.6 C) (Oral)  Resp 16  Ht 5\' 10"  (1.778 m)   Wt 214 lb (97.07 kg)  BMI 30.71 kg/m2  SpO2 97%  Physical Exam  Constitutional: He is oriented to person, place, and time. No distress.  HENT:  Head: Normocephalic and atraumatic.  Mouth/Throat: Oropharynx is clear and moist. No oropharyngeal exudate.  Eyes: Conjunctivae are normal. Right eye exhibits no discharge. Left eye exhibits no discharge. No scleral icterus.  Neck: Normal range of motion. Neck supple. No JVD present. No tracheal deviation present. No thyromegaly present.  Cardiovascular: Normal rate, regular rhythm, normal heart sounds and intact distal pulses.  Exam reveals no gallop and no friction rub.   No murmur heard. EKG --  Sinus  Rhythm  WITHIN NORMAL LIMITS  Pulmonary/Chest: Effort normal and breath sounds normal. No stridor. No respiratory distress. He has no wheezes. He has no rales. He exhibits no tenderness.  Abdominal: Soft. Bowel sounds are normal. He exhibits no distension and no mass. There is no tenderness. There is no rebound and no guarding. Hernia confirmed negative in the right inguinal area and confirmed negative in the left inguinal area.  Genitourinary: Testes normal and penis normal. Rectal exam shows external hemorrhoid. Rectal exam shows no internal hemorrhoid, no fissure, no mass, no tenderness and anal tone normal. Guaiac negative stool. Prostate is enlarged (2+ smooth symm BPH). Prostate is not tender. Right testis shows no mass, no swelling and no tenderness. Right testis is descended. Left testis shows no mass, no swelling and no tenderness. Left testis is descended. Circumcised. No penile erythema or penile tenderness. No discharge found.  Musculoskeletal: Normal range of motion. He exhibits no edema or tenderness.  Lymphadenopathy:    He has no cervical adenopathy.       Right: No inguinal adenopathy present.       Left: No inguinal adenopathy present.  Neurological: He is oriented to person, place, and time.  Skin: Skin is warm and dry. No rash  noted. He is not diaphoretic. No erythema. No pallor.  Psychiatric: He has a normal mood and affect. His behavior is normal. Judgment and thought content normal.  Vitals reviewed.  Lab Results  Component Value Date   WBC 5.7 01/17/2016   HGB 16.3 01/17/2016   HCT 47.6 01/17/2016   PLT 163.0 01/17/2016   GLUCOSE 111* 01/17/2016   CHOL 186 01/17/2016   TRIG 93.0 01/17/2016   HDL 42.50 01/17/2016   LDLDIRECT 146.5 12/05/2010   LDLCALC 125* 01/17/2016   ALT 57* 01/17/2016   AST 33 01/17/2016   NA 139 01/17/2016   K 3.7 01/17/2016   CL 104 01/17/2016   CREATININE 0.93 01/17/2016   BUN 26* 01/17/2016   CO2 28 01/17/2016   TSH 2.30 01/17/2016   PSA 0.82 01/17/2016      Assessment & Plan:   Alex [name] was seen today for benign prostatic hypertrophy, hypertension and annual exam.  Diagnoses and all orders for this visit:  Essential hypertension- his EKG is negative for left ventricular hypertrophy or any other abnormalities, his blood pressure is well-controlled, electrolytes and renal function are stable, will continue the current regimen. -     hydrochlorothiazide (MICROZIDE) 12.5 MG capsule; take 1 capsule by mouth once daily -     diltiazem (CARDIZEM CD) 240 MG 24 hr capsule; take 1  capsule by mouth once daily -     EKG 12-Lead -     losartan (COZAAR) 50 MG tablet; Take 1 tablet (50 mg total) by mouth daily.  B12 deficiency- improvement noted -     Vitamin B12; Future -     Folate; Future  BPH associated with nocturia- his PSA is normal so I'm not suspicious for prostate cancer, at this time he does not want to start an alpha blocker to treat the nocturia  Hypokalemia- her potassium and magnesium are normal ,l-     Magnesium; Future  Routine general medical examination at a health care facility- he refused a flu vaccine, colonoscopy is up-to-date, exam completed, labs ordered and reviewed, patient education material was given.  -     Lipid panel; Future -      Comprehensive metabolic panel; Future -     CBC with Differential/Platelet; Future -     TSH; Future -     Urinalysis, Routine w reflex microscopic (not at [facility]); Future -     Hepatitis C antibody; Future -     HIV antibody; Future -     PSA; Future   I am having Alex [name] maintain his fexofenadine, traMADol, meloxicam, GLUCOSAMINE HCL PO, esomeprazole, hydrochlorothiazide, diltiazem, and losartan. We will continue to administer cyanocobalamin.  Meds ordered this encounter  Medications  . hydrochlorothiazide (MICROZIDE) 12.5 MG capsule    Sig: take 1 capsule by mouth once daily    Dispense:  90 capsule    Refill:  1  . diltiazem (CARDIZEM CD) 240 MG 24 hr capsule    Sig: take 1 capsule by mouth once daily    Dispense:  90 capsule    Refill:  1  . losartan (COZAAR) 50 MG tablet    Sig: Take 1 tablet (50 mg total) by mouth daily.    Dispense:  90 tablet    Refill:  3     Follow-up: Return in about 6 months (around 07/19/2016).  Scarlette Calico, MD

## 2016-01-17 NOTE — Patient Instructions (Signed)

## 2016-01-17 NOTE — Progress Notes (Signed)
Pre visit review using our clinic review tool, if applicable. No additional management support is needed unless otherwise documented below in the visit note. 

## 2016-01-18 ENCOUNTER — Encounter: Payer: Self-pay | Admitting: Internal Medicine

## 2016-01-18 LAB — HEPATITIS C ANTIBODY: HCV Ab: NEGATIVE

## 2016-01-18 LAB — HIV ANTIBODY (ROUTINE TESTING W REFLEX): HIV 1&2 Ab, 4th Generation: NONREACTIVE

## 2016-02-14 ENCOUNTER — Ambulatory Visit (INDEPENDENT_AMBULATORY_CARE_PROVIDER_SITE_OTHER): Payer: BLUE CROSS/BLUE SHIELD | Admitting: Geriatric Medicine

## 2016-02-14 DIAGNOSIS — E538 Deficiency of other specified B group vitamins: Secondary | ICD-10-CM

## 2016-02-14 MED ORDER — CYANOCOBALAMIN 1000 MCG/ML IJ SOLN
1000.0000 ug | Freq: Once | INTRAMUSCULAR | Status: AC
  Administered 2016-02-14: 1000 ug via INTRAMUSCULAR

## 2016-02-15 ENCOUNTER — Ambulatory Visit (INDEPENDENT_AMBULATORY_CARE_PROVIDER_SITE_OTHER): Payer: BLUE CROSS/BLUE SHIELD | Admitting: Internal Medicine

## 2016-02-15 ENCOUNTER — Encounter: Payer: Self-pay | Admitting: Internal Medicine

## 2016-02-15 VITALS — BP 140/80 | HR 82 | Ht 70.0 in | Wt 216.8 lb

## 2016-02-15 DIAGNOSIS — K2270 Barrett's esophagus without dysplasia: Secondary | ICD-10-CM | POA: Diagnosis not present

## 2016-02-15 DIAGNOSIS — K219 Gastro-esophageal reflux disease without esophagitis: Secondary | ICD-10-CM

## 2016-02-15 MED ORDER — ESOMEPRAZOLE MAGNESIUM 40 MG PO CPDR: DELAYED_RELEASE_CAPSULE | ORAL | Status: DC

## 2016-02-15 NOTE — Patient Instructions (Signed)
We have sent the following medications to your pharmacy for you to pick up at your convenience:  Generic Nexium  Please follow up in one year

## 2016-02-16 ENCOUNTER — Encounter: Payer: Self-pay | Admitting: Internal Medicine

## 2016-02-16 NOTE — Progress Notes (Signed)
HISTORY OF PRESENT ILLNESS:  Alex [name] is a 60 y.o. male with chronic GERD and nondysplastic short segment Barrett's esophagus. He presents today for follow-up and ongoing management of his chronic esophageal disease. Patient last underwent upper endoscopy October 2015. Biopsies revealed nondysplastic short segment Barrett's esophagus but was otherwise normal. Routine follow-up surveillance in 5 years recommended. The patient continues on omeprazole 40 mg daily. On medication no indigestion or heartburn. No interval development of dysphagia. No appreciable medication side effects. He does request medication refill. His colon cancer screening is up-to-date with previous colonoscopy April 2010 revealing moderate left-sided diverticulosis only  REVIEW OF SYSTEMS:  All non-GI ROS negative upon complete review of all systems non-GI  Past Medical History  Diagnosis Date  . GERD (gastroesophageal reflux disease)   . Barrett's esophagus   . Hypertension   . B12 deficiency   . 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 Achilles tendinitis     PMH of  . Hyperlipidemia   . Nonspecific elevation of levels of transaminase or lactic acid dehydrogenase (LDH)     PMH of  . Fatty liver 10-16-2011    [place]  . Diverticulosis of colon (without mention of hemorrhage) 2010  . Gilbert syndrome   . Hiatal hernia     Past Surgical History  Procedure Laterality Date  . Vasectomy    . Wisdom tooth extraction    . Upper gastrointestinal endoscopy      Dr Sharlett Iles; multiple  snce 1992  . Colonoscopy  2010    negative  . Knee arthroscopy      R 2015 & L 2016; [place]  reports that he has never smoked. He has never used smokeless tobacco. He reports that he does not drink alcohol or use illicit drugs.  family history includes Esophageal cancer in his father; Heart attack (age of onset: 70) in his mother; Heart disease in his father; Hypertension in his father, mother, sister,  sister, and sister; Stomach cancer in his father. There is no history of Stroke, Cancer, Diabetes, Early death, Hyperlipidemia, Kidney disease, or Alcohol abuse.  No Known Allergies     PHYSICAL EXAMINATION: Vital signs: BP 140/80 mmHg  Pulse 82  Ht 5\' 10"  (1.778 m)  Wt 216 lb 12.8 oz (98.34 kg)  BMI 31.11 kg/m2 General: Well-developed, well-nourished, no acute distress HEENT: Sclerae are anicteric, conjunctiva pink. Oral mucosa intact Lungs: Clear Heart: Regular Abdomen: soft, nontender, nondistended, no obvious ascites, no peritoneal signs, normal bowel sounds. No organomegaly. Extremities: NoClubbing cyanosis or edema Psychiatric: alert and oriented x3. Cooperative     ASSESSMENT:  #1. GERD, can invite nondysplastic short segment Barrett's esophagus. Stable on PPI #2. Last screening colonoscopy 2010. Negative for neoplasia  PLAN:  #1. Reflux precautions #2. Refill omeprazole 40 mg daily. #3. Routine office follow-up one year #4. Surveillance upper endoscopy 2020 #5. Surveillance colonoscopy 2020  15 minutes was spent face-to-face with the patient. Greater than 50% a time use for counseling regarding his chronic esophageal disease and answering questions

## 2016-03-20 ENCOUNTER — Ambulatory Visit (INDEPENDENT_AMBULATORY_CARE_PROVIDER_SITE_OTHER): Payer: BLUE CROSS/BLUE SHIELD

## 2016-03-20 DIAGNOSIS — E538 Deficiency of other specified B group vitamins: Secondary | ICD-10-CM | POA: Diagnosis not present

## 2016-03-20 MED ORDER — CYANOCOBALAMIN 1000 MCG/ML IJ SOLN
1000.0000 ug | Freq: Once | INTRAMUSCULAR | Status: AC
  Administered 2016-03-20: 1000 ug via INTRAMUSCULAR

## 2016-04-05 DIAGNOSIS — M25562 Pain in left knee: Secondary | ICD-10-CM | POA: Diagnosis not present

## 2016-04-05 DIAGNOSIS — M170 Bilateral primary osteoarthritis of knee: Secondary | ICD-10-CM | POA: Diagnosis not present

## 2016-04-05 DIAGNOSIS — M25561 Pain in right knee: Secondary | ICD-10-CM | POA: Diagnosis not present

## 2016-05-01 ENCOUNTER — Ambulatory Visit (INDEPENDENT_AMBULATORY_CARE_PROVIDER_SITE_OTHER): Payer: BLUE CROSS/BLUE SHIELD | Admitting: *Deleted

## 2016-05-01 DIAGNOSIS — E538 Deficiency of other specified B group vitamins: Secondary | ICD-10-CM

## 2016-05-01 MED ORDER — CYANOCOBALAMIN 1000 MCG/ML IJ SOLN
1000.0000 ug | Freq: Once | INTRAMUSCULAR | Status: AC
  Administered 2016-05-01: 1000 ug via INTRAMUSCULAR

## 2016-05-02 ENCOUNTER — Ambulatory Visit: Payer: Self-pay

## 2016-06-04 ENCOUNTER — Telehealth: Payer: Self-pay | Admitting: Emergency Medicine

## 2016-06-04 DIAGNOSIS — I10 Essential (primary) hypertension: Secondary | ICD-10-CM

## 2016-06-04 NOTE — Telephone Encounter (Signed)
Pt called and wanted to know if he needs to make an appointment or can you go ahead and fill his prescriptions? He states the medicine is working great. He just wants a heads up on which to do. Please follow up thanks.

## 2016-06-05 ENCOUNTER — Ambulatory Visit (INDEPENDENT_AMBULATORY_CARE_PROVIDER_SITE_OTHER): Payer: BLUE CROSS/BLUE SHIELD

## 2016-06-05 DIAGNOSIS — E538 Deficiency of other specified B group vitamins: Secondary | ICD-10-CM | POA: Diagnosis not present

## 2016-06-05 MED ORDER — HYDROCHLOROTHIAZIDE 12.5 MG PO CAPS: ORAL_CAPSULE | ORAL | 0 refills | Status: DC

## 2016-06-05 MED ORDER — LOSARTAN POTASSIUM 50 MG PO TABS: 50.0000 mg | ORAL_TABLET | Freq: Every day | ORAL | 0 refills | Status: DC

## 2016-06-05 MED ORDER — CYANOCOBALAMIN 1000 MCG/ML IJ SOLN
1000.0000 ug | Freq: Once | INTRAMUSCULAR | Status: AC
  Administered 2016-06-05: 1000 ug via INTRAMUSCULAR

## 2016-06-05 MED ORDER — DILTIAZEM HCL ER COATED BEADS 240 MG PO CP24: ORAL_CAPSULE | ORAL | 0 refills | Status: DC

## 2016-06-05 NOTE — Telephone Encounter (Signed)
Tried to call pt but there was no vm to leave a message.  Refills should be at the pharmacy and pt should contact the pharmacy to fill the medication.   Erx for 1 month sent.

## 2016-07-10 ENCOUNTER — Ambulatory Visit (INDEPENDENT_AMBULATORY_CARE_PROVIDER_SITE_OTHER): Payer: BLUE CROSS/BLUE SHIELD

## 2016-07-10 DIAGNOSIS — E538 Deficiency of other specified B group vitamins: Secondary | ICD-10-CM | POA: Diagnosis not present

## 2016-07-10 MED ORDER — CYANOCOBALAMIN 1000 MCG/ML IJ SOLN
1000.0000 ug | Freq: Once | INTRAMUSCULAR | Status: AC
  Administered 2016-07-10: 1000 ug via INTRAMUSCULAR

## 2016-07-18 DIAGNOSIS — M25562 Pain in left knee: Secondary | ICD-10-CM | POA: Diagnosis not present

## 2016-07-18 DIAGNOSIS — M25561 Pain in right knee: Secondary | ICD-10-CM | POA: Diagnosis not present

## 2016-07-18 DIAGNOSIS — M1711 Unilateral primary osteoarthritis, right knee: Secondary | ICD-10-CM | POA: Diagnosis not present

## 2016-07-18 DIAGNOSIS — M1712 Unilateral primary osteoarthritis, left knee: Secondary | ICD-10-CM | POA: Diagnosis not present

## 2016-07-18 DIAGNOSIS — M170 Bilateral primary osteoarthritis of knee: Secondary | ICD-10-CM | POA: Diagnosis not present

## 2016-07-24 ENCOUNTER — Encounter: Payer: Self-pay | Admitting: Internal Medicine

## 2016-07-24 ENCOUNTER — Ambulatory Visit (INDEPENDENT_AMBULATORY_CARE_PROVIDER_SITE_OTHER): Payer: BLUE CROSS/BLUE SHIELD | Admitting: Internal Medicine

## 2016-07-24 ENCOUNTER — Other Ambulatory Visit (INDEPENDENT_AMBULATORY_CARE_PROVIDER_SITE_OTHER): Payer: BLUE CROSS/BLUE SHIELD

## 2016-07-24 VITALS — BP 142/92 | HR 78 | Temp 98.5°F | Ht 70.0 in | Wt 214.0 lb

## 2016-07-24 DIAGNOSIS — R739 Hyperglycemia, unspecified: Secondary | ICD-10-CM

## 2016-07-24 DIAGNOSIS — I10 Essential (primary) hypertension: Secondary | ICD-10-CM

## 2016-07-24 LAB — BASIC METABOLIC PANEL
BUN: 26 mg/dL — ABNORMAL HIGH (ref 6–23)
CO2: 31 mEq/L (ref 19–32)
Calcium: 9 mg/dL (ref 8.4–10.5)
Chloride: 103 mEq/L (ref 96–112)
Creatinine, Ser: 0.97 mg/dL (ref 0.40–1.50)
GFR: 84.14 mL/min (ref >60.00)
Glucose, Bld: 92 mg/dL (ref 70–99)
Potassium: 3.9 mEq/L (ref 3.5–5.1)
Sodium: 141 mEq/L (ref 135–145)

## 2016-07-24 LAB — HEMOGLOBIN A1C: Hgb A1c MFr Bld: 5.6 % (ref 4.6–6.5)

## 2016-07-24 MED ORDER — HYDROCHLOROTHIAZIDE 12.5 MG PO CAPS: ORAL_CAPSULE | ORAL | 1 refills | Status: DC

## 2016-07-24 MED ORDER — DILTIAZEM HCL ER COATED BEADS 240 MG PO CP24: ORAL_CAPSULE | ORAL | 1 refills | Status: DC

## 2016-07-24 MED ORDER — LOSARTAN POTASSIUM 50 MG PO TABS: 50.0000 mg | ORAL_TABLET | Freq: Every day | ORAL | 1 refills | Status: DC

## 2016-07-24 NOTE — Progress Notes (Signed)
Pre visit review using our clinic review tool, if applicable. No additional management support is needed unless otherwise documented below in the visit note. 

## 2016-07-24 NOTE — Progress Notes (Signed)
Subjective:  Patient ID: Alex [name], male    DOB: 15-Feb-1956  Age: 60 y.o. MRN: [id number]  CC: Hypertension (6 months follow up and medication refill)   HPI Alex [name] presents for follow-up on hypertension and hyperglycemia. He feels well and offers no complaints. He tells me his blood pressures been well controlled on the combination of diltiazem, losartan, and hydrochlorothiazide.  Outpatient Medications Prior to Visit  Medication Sig Dispense Refill  . Cyanocobalamin (VITAMIN B-12 IJ) Inject as directed. monthly    . esomeprazole (NEXIUM) 40 MG capsule take 1 capsule by mouth every morning BEFORE BREAKFAST 30 capsule 11  . fexofenadine (ALLEGRA) 180 MG tablet Take 180 mg by mouth daily.      Marland Kitchen GLUCOSAMINE HCL PO Take by mouth.    . meloxicam (MOBIC) 15 MG tablet Take 15 mg by mouth daily.    Marland Kitchen diltiazem (CARDIZEM CD) 240 MG 24 hr capsule take 1 capsule by mouth once daily 30 capsule 0  . hydrochlorothiazide (MICROZIDE) 12.5 MG capsule take 1 capsule by mouth once daily 30 capsule 0  . losartan (COZAAR) 50 MG tablet Take 1 tablet (50 mg total) by mouth daily. 30 tablet 0   Facility-Administered Medications Prior to Visit  Medication Dose Route Frequency Provider Last Rate Last Dose  . cyanocobalamin ((VITAMIN B-12)) injection 1,000 mcg  1,000 mcg Intramuscular Q30 days Hendricks Limes, MD        ROS Review of Systems  Constitutional: Negative.  Negative for activity change, diaphoresis, fatigue and unexpected weight change.  HENT: Negative.   Eyes: Negative.  Negative for photophobia and visual disturbance.  Respiratory: Negative.  Negative for cough, choking, chest tightness, shortness of breath, wheezing and stridor.   Cardiovascular: Negative.  Negative for chest pain, palpitations and leg swelling.  Gastrointestinal: Negative.  Negative for abdominal pain, constipation, diarrhea, nausea and vomiting.  Endocrine: Negative.  Negative for polydipsia,  polyphagia and polyuria.  Genitourinary: Negative.  Negative for difficulty urinating and dysuria.  Musculoskeletal: Negative.  Negative for arthralgias, back pain, myalgias and neck pain.  Skin: Negative.  Negative for color change, pallor and rash.  Allergic/Immunologic: Negative.   Neurological: Negative.  Negative for dizziness, tremors, weakness, numbness and headaches.  Hematological: Negative.  Negative for adenopathy. Does not bruise/bleed easily.  Psychiatric/Behavioral: Negative.     Objective:  BP (!) 142/92 (BP Location: Left Arm, Patient Position: Sitting, Cuff Size: Normal)   Pulse 78   Temp 98.5 F (36.9 C)   Ht 5\' 10"  (1.778 m)   Wt 214 lb (97.1 kg)   SpO2 98%   BMI 30.71 kg/m   BP Readings from Last 3 Encounters:  07/24/16 (!) 142/92  02/15/16 140/80  01/17/16 124/84    Wt Readings from Last 3 Encounters:  07/24/16 214 lb (97.1 kg)  02/15/16 216 lb 12.8 oz (98.3 kg)  01/17/16 214 lb (97.1 kg)    Physical Exam  Constitutional: He is oriented to person, place, and time. No distress.  HENT:  Mouth/Throat: Oropharynx is clear and moist. No oropharyngeal exudate.  Eyes: Conjunctivae are normal. Right eye exhibits no discharge. Left eye exhibits no discharge. No scleral icterus.  Neck: Normal range of motion. Neck supple. No JVD present. No tracheal deviation present. No thyromegaly present.  Cardiovascular: Normal rate, regular rhythm, normal heart sounds and intact distal pulses.  Exam reveals no gallop and no friction rub.   No murmur heard. Pulmonary/Chest: Effort normal and breath sounds normal. No stridor. No  respiratory distress. He has no wheezes. He has no rales. He exhibits no tenderness.  Abdominal: Soft. Bowel sounds are normal. He exhibits no distension and no mass. There is no tenderness. There is no rebound and no guarding.  Musculoskeletal: He exhibits no edema, tenderness or deformity.  Lymphadenopathy:    He has no cervical adenopathy.    Neurological: He is oriented to person, place, and time.  Skin: Skin is warm and dry. No rash noted. He is not diaphoretic. No erythema. No pallor.  Vitals reviewed.   Lab Results  Component Value Date   WBC 5.7 01/17/2016   HGB 16.3 01/17/2016   HCT 47.6 01/17/2016   PLT 163.0 01/17/2016   GLUCOSE 92 07/24/2016   CHOL 186 01/17/2016   TRIG 93.0 01/17/2016   HDL 42.50 01/17/2016   LDLDIRECT 146.5 12/05/2010   LDLCALC 125 (H) 01/17/2016   ALT 57 (H) 01/17/2016   AST 33 01/17/2016   NA 141 07/24/2016   K 3.9 07/24/2016   CL 103 07/24/2016   CREATININE 0.97 07/24/2016   BUN 26 (H) 07/24/2016   CO2 31 07/24/2016   TSH 2.30 01/17/2016   PSA 0.82 01/17/2016   HGBA1C 5.6 07/24/2016    Dg Finger Ring Right  Result Date: 08/13/2013 CLINICAL DATA:  Injury. EXAM: RIGHT RING FINGER 2+V COMPARISON:  None. FINDINGS: Soft tissue injury right 4th finger distal phalanx level without underlying fracture or dislocation. IMPRESSION: Soft tissue injury right 4th finger distal phalanx level without underlying fracture or dislocation. Electronically Signed   By: Chauncey Cruel M.D.   On: 08/13/2013 12:50    Assessment & Plan:   Alex [name] was seen today for hypertension.  Diagnoses and all orders for this visit:  Essential hypertension- his blood pressure is well-controlled, electrolytes and renal function are stable. -     Basic metabolic panel; Future -     diltiazem (CARDIZEM CD) 240 MG 24 hr capsule; take 1 capsule by mouth once daily -     hydrochlorothiazide (MICROZIDE) 12.5 MG capsule; take 1 capsule by mouth once daily -     losartan (COZAAR) 50 MG tablet; Take 1 tablet (50 mg total) by mouth daily.  Hyperglycemia- he has very mild prediabetes, he agrees to work on his lifestyle modifications with diet/exercise/weight loss. -     Basic metabolic panel; Future -     Hemoglobin A1c; Future   I am having Alex [name] maintain his fexofenadine, meloxicam, GLUCOSAMINE HCL PO,  Cyanocobalamin (VITAMIN B-12 IJ), esomeprazole, traMADol, diltiazem, hydrochlorothiazide, and losartan. We will continue to administer cyanocobalamin.  Meds ordered this encounter  Medications  . traMADol (ULTRAM) 50 MG tablet    Sig: take 1 tablet by mouth once daily if needed for pain    Refill:  0  . diltiazem (CARDIZEM CD) 240 MG 24 hr capsule    Sig: take 1 capsule by mouth once daily    Dispense:  90 capsule    Refill:  1  . hydrochlorothiazide (MICROZIDE) 12.5 MG capsule    Sig: take 1 capsule by mouth once daily    Dispense:  90 capsule    Refill:  1  . losartan (COZAAR) 50 MG tablet    Sig: Take 1 tablet (50 mg total) by mouth daily.    Dispense:  90 tablet    Refill:  1     Follow-up: Return in about 6 months (around 01/21/2017).  Scarlette Calico, MD

## 2016-07-24 NOTE — Patient Instructions (Signed)
Hypertension Hypertension, commonly called high blood pressure, is when the force of blood pumping through your arteries is too strong. Your arteries are the blood vessels that carry blood from your heart throughout your body. A blood pressure reading consists of a higher number over a lower number, such as 110/72. The higher number (systolic) is the pressure inside your arteries when your heart pumps. The lower number (diastolic) is the pressure inside your arteries when your heart relaxes. Ideally you want your blood pressure below 120/80. Hypertension forces your heart to work harder to pump blood. Your arteries may become narrow or stiff. Having untreated or uncontrolled hypertension can cause heart attack, stroke, kidney disease, and other problems. RISK FACTORS Some risk factors for high blood pressure are controllable. Others are not.  Risk factors you cannot control include:   Race. You may be at higher risk if you are African American.  Age. Risk increases with age.  Gender. Men are at higher risk than women before age 45 years. After age 65, women are at higher risk than men. Risk factors you can control include:  Not getting enough exercise or physical activity.  Being overweight.  Getting too much fat, sugar, calories, or salt in your diet.  Drinking too much alcohol. SIGNS AND SYMPTOMS Hypertension does not usually cause signs or symptoms. Extremely high blood pressure (hypertensive crisis) may cause headache, anxiety, shortness of breath, and nosebleed. DIAGNOSIS To check if you have hypertension, your health care provider will measure your blood pressure while you are seated, with your arm held at the level of your heart. It should be measured at least twice using the same arm. Certain conditions can cause a difference in blood pressure between your right and left arms. A blood pressure reading that is higher than normal on one occasion does not mean that you need treatment. If  it is not clear whether you have high blood pressure, you may be asked to return on a different day to have your blood pressure checked again. Or, you may be asked to monitor your blood pressure at home for 1 or more weeks. TREATMENT Treating high blood pressure includes making lifestyle changes and possibly taking medicine. Living a healthy lifestyle can help lower high blood pressure. You may need to change some of your habits. Lifestyle changes may include:  Following the DASH diet. This diet is high in fruits, vegetables, and whole grains. It is low in salt, red meat, and added sugars.  Keep your sodium intake below 2,300 mg per day.  Getting at least 30-45 minutes of aerobic exercise at least 4 times per week.  Losing weight if necessary.  Not smoking.  Limiting alcoholic beverages.  Learning ways to reduce stress. Your health care provider may prescribe medicine if lifestyle changes are not enough to get your blood pressure under control, and if one of the following is true:  You are 18-59 years of age and your systolic blood pressure is above 140.  You are 60 years of age or older, and your systolic blood pressure is above 150.  Your diastolic blood pressure is above 90.  You have diabetes, and your systolic blood pressure is over 140 or your diastolic blood pressure is over 90.  You have kidney disease and your blood pressure is above 140/90.  You have heart disease and your blood pressure is above 140/90. Your personal target blood pressure may vary depending on your medical conditions, your age, and other factors. HOME CARE INSTRUCTIONS    Have your blood pressure rechecked as directed by your health care provider.   Take medicines only as directed by your health care provider. Follow the directions carefully. Blood pressure medicines must be taken as prescribed. The medicine does not work as well when you skip doses. Skipping doses also puts you at risk for  problems.  Do not smoke.   Monitor your blood pressure at home as directed by your health care provider. SEEK MEDICAL CARE IF:   You think you are having a reaction to medicines taken.  You have recurrent headaches or feel dizzy.  You have swelling in your ankles.  You have trouble with your vision. SEEK IMMEDIATE MEDICAL CARE IF:  You develop a severe headache or confusion.  You have unusual weakness, numbness, or feel faint.  You have severe chest or abdominal pain.  You vomit repeatedly.  You have trouble breathing. MAKE SURE YOU:   Understand these instructions.  Will watch your condition.  Will get help right away if you are not doing well or get worse.   This information is not intended to replace advice given to you by your health care provider. Make sure you discuss any questions you have with your health care provider.   Document Released: 10/14/2005 Document Revised: 02/28/2015 Document Reviewed: 08/06/2013 Elsevier Interactive Patient Education 2016 [employer]  

## 2016-08-14 ENCOUNTER — Ambulatory Visit (INDEPENDENT_AMBULATORY_CARE_PROVIDER_SITE_OTHER): Payer: BLUE CROSS/BLUE SHIELD | Admitting: Geriatric Medicine

## 2016-08-14 DIAGNOSIS — E538 Deficiency of other specified B group vitamins: Secondary | ICD-10-CM

## 2016-08-14 MED ORDER — CYANOCOBALAMIN 1000 MCG/ML IJ SOLN: 1000.0000 ug | Freq: Once | INTRAMUSCULAR | Status: DC

## 2016-09-17 ENCOUNTER — Ambulatory Visit (INDEPENDENT_AMBULATORY_CARE_PROVIDER_SITE_OTHER): Payer: BLUE CROSS/BLUE SHIELD | Admitting: General Practice

## 2016-09-17 DIAGNOSIS — E538 Deficiency of other specified B group vitamins: Secondary | ICD-10-CM

## 2016-09-17 MED ORDER — CYANOCOBALAMIN 1000 MCG/ML IJ SOLN
1000.0000 ug | Freq: Once | INTRAMUSCULAR | Status: AC
  Administered 2016-09-17: 1000 ug via INTRAMUSCULAR

## 2016-10-17 DIAGNOSIS — M1711 Unilateral primary osteoarthritis, right knee: Secondary | ICD-10-CM | POA: Diagnosis not present

## 2016-10-17 DIAGNOSIS — G8929 Other chronic pain: Secondary | ICD-10-CM | POA: Diagnosis not present

## 2016-10-17 DIAGNOSIS — M170 Bilateral primary osteoarthritis of knee: Secondary | ICD-10-CM | POA: Diagnosis not present

## 2016-10-17 DIAGNOSIS — M1712 Unilateral primary osteoarthritis, left knee: Secondary | ICD-10-CM | POA: Diagnosis not present

## 2016-10-17 DIAGNOSIS — M25562 Pain in left knee: Secondary | ICD-10-CM | POA: Diagnosis not present

## 2016-10-18 ENCOUNTER — Ambulatory Visit (INDEPENDENT_AMBULATORY_CARE_PROVIDER_SITE_OTHER): Payer: BLUE CROSS/BLUE SHIELD

## 2016-10-18 DIAGNOSIS — E538 Deficiency of other specified B group vitamins: Secondary | ICD-10-CM

## 2016-11-20 ENCOUNTER — Ambulatory Visit (INDEPENDENT_AMBULATORY_CARE_PROVIDER_SITE_OTHER): Payer: BLUE CROSS/BLUE SHIELD | Admitting: Geriatric Medicine

## 2016-11-20 DIAGNOSIS — E538 Deficiency of other specified B group vitamins: Secondary | ICD-10-CM

## 2016-11-20 DIAGNOSIS — D1801 Hemangioma of skin and subcutaneous tissue: Secondary | ICD-10-CM | POA: Diagnosis not present

## 2016-11-20 DIAGNOSIS — L738 Other specified follicular disorders: Secondary | ICD-10-CM | POA: Diagnosis not present

## 2016-11-20 DIAGNOSIS — L919 Hypertrophic disorder of the skin, unspecified: Secondary | ICD-10-CM | POA: Diagnosis not present

## 2016-11-20 MED ORDER — CYANOCOBALAMIN 1000 MCG/ML IJ SOLN
1000.0000 ug | Freq: Once | INTRAMUSCULAR | Status: AC
  Administered 2016-11-20: 1000 ug via INTRAMUSCULAR

## 2016-12-05 DIAGNOSIS — M25561 Pain in right knee: Secondary | ICD-10-CM | POA: Diagnosis not present

## 2016-12-05 DIAGNOSIS — M170 Bilateral primary osteoarthritis of knee: Secondary | ICD-10-CM | POA: Diagnosis not present

## 2016-12-05 DIAGNOSIS — M1711 Unilateral primary osteoarthritis, right knee: Secondary | ICD-10-CM | POA: Diagnosis not present

## 2016-12-12 DIAGNOSIS — M170 Bilateral primary osteoarthritis of knee: Secondary | ICD-10-CM | POA: Diagnosis not present

## 2016-12-19 DIAGNOSIS — M25562 Pain in left knee: Secondary | ICD-10-CM | POA: Diagnosis not present

## 2016-12-19 DIAGNOSIS — M170 Bilateral primary osteoarthritis of knee: Secondary | ICD-10-CM | POA: Diagnosis not present

## 2016-12-19 DIAGNOSIS — M25561 Pain in right knee: Secondary | ICD-10-CM | POA: Diagnosis not present

## 2016-12-19 DIAGNOSIS — G8929 Other chronic pain: Secondary | ICD-10-CM | POA: Diagnosis not present

## 2016-12-25 ENCOUNTER — Ambulatory Visit: Payer: Self-pay

## 2017-01-08 ENCOUNTER — Ambulatory Visit (INDEPENDENT_AMBULATORY_CARE_PROVIDER_SITE_OTHER): Payer: BLUE CROSS/BLUE SHIELD

## 2017-01-08 DIAGNOSIS — E538 Deficiency of other specified B group vitamins: Secondary | ICD-10-CM

## 2017-01-08 MED ORDER — CYANOCOBALAMIN 1000 MCG/ML IJ SOLN
1000.0000 ug | Freq: Once | INTRAMUSCULAR | Status: AC
  Administered 2017-01-08: 1000 ug via INTRAMUSCULAR

## 2017-01-22 ENCOUNTER — Encounter: Payer: Self-pay | Admitting: Internal Medicine

## 2017-01-22 ENCOUNTER — Other Ambulatory Visit (INDEPENDENT_AMBULATORY_CARE_PROVIDER_SITE_OTHER): Payer: BLUE CROSS/BLUE SHIELD

## 2017-01-22 ENCOUNTER — Ambulatory Visit (INDEPENDENT_AMBULATORY_CARE_PROVIDER_SITE_OTHER): Payer: BLUE CROSS/BLUE SHIELD | Admitting: Internal Medicine

## 2017-01-22 VITALS — BP 162/106 | HR 80 | Temp 97.6°F | Resp 16 | Ht 70.0 in | Wt 214.4 lb

## 2017-01-22 DIAGNOSIS — R7983 Abnormal findings of blood amino-acid level: Secondary | ICD-10-CM

## 2017-01-22 DIAGNOSIS — E785 Hyperlipidemia, unspecified: Secondary | ICD-10-CM | POA: Diagnosis not present

## 2017-01-22 DIAGNOSIS — R0683 Snoring: Secondary | ICD-10-CM

## 2017-01-22 DIAGNOSIS — E7219 Other disorders of sulfur-bearing amino-acid metabolism: Principal | ICD-10-CM

## 2017-01-22 DIAGNOSIS — I10 Essential (primary) hypertension: Secondary | ICD-10-CM | POA: Diagnosis not present

## 2017-01-22 DIAGNOSIS — E538 Deficiency of other specified B group vitamins: Secondary | ICD-10-CM | POA: Diagnosis not present

## 2017-01-22 DIAGNOSIS — E7211 Homocystinuria: Secondary | ICD-10-CM | POA: Diagnosis not present

## 2017-01-22 DIAGNOSIS — Z0000 Encounter for general adult medical examination without abnormal findings: Secondary | ICD-10-CM

## 2017-01-22 LAB — LIPID PANEL
Cholesterol: 173 mg/dL (ref 0–200)
HDL: 41.9 mg/dL (ref >39.00)
LDL Cholesterol: 111 mg/dL — ABNORMAL HIGH (ref 0–99)
NonHDL: 130.65
Total CHOL/HDL Ratio: 4
Triglycerides: 96 mg/dL (ref 0.0–149.0)
VLDL: 19.2 mg/dL (ref 0.0–40.0)

## 2017-01-22 LAB — CBC WITH DIFFERENTIAL/PLATELET
Basophils Absolute: 0.1 10*3/uL (ref 0.0–0.1)
Basophils Relative: 1.2 % (ref 0.0–3.0)
Eosinophils Absolute: 0.1 10*3/uL (ref 0.0–0.7)
Eosinophils Relative: 1.5 % (ref 0.0–5.0)
HCT: 47.7 % (ref 39.0–52.0)
Hemoglobin: 16.7 g/dL (ref 13.0–17.0)
Lymphocytes Relative: 24.1 % (ref 12.0–46.0)
Lymphs Abs: 1.2 10*3/uL (ref 0.7–4.0)
MCHC: 35 g/dL (ref 30.0–36.0)
MCV: 93.7 fl (ref 78.0–100.0)
Monocytes Absolute: 0.5 10*3/uL (ref 0.1–1.0)
Monocytes Relative: 9.4 % (ref 3.0–12.0)
Neutro Abs: 3.3 10*3/uL (ref 1.4–7.7)
Neutrophils Relative %: 63.8 % (ref 43.0–77.0)
Platelets: 181 10*3/uL (ref 150.0–400.0)
RBC: 5.09 Mil/uL (ref 4.22–5.81)
RDW: 13.2 % (ref 11.5–15.5)
WBC: 5.2 10*3/uL (ref 4.0–10.5)

## 2017-01-22 LAB — URINALYSIS, ROUTINE W REFLEX MICROSCOPIC
Bilirubin Urine: NEGATIVE
Hgb urine dipstick: NEGATIVE
Ketones, ur: NEGATIVE
Leukocytes, UA: NEGATIVE
Nitrite: NEGATIVE
RBC / HPF: NONE SEEN (ref 0–?)
Specific Gravity, Urine: 1.015 (ref 1.000–1.030)
Total Protein, Urine: NEGATIVE
Urine Glucose: NEGATIVE
Urobilinogen, UA: 0.2 (ref 0.0–1.0)
WBC, UA: NONE SEEN (ref 0–?)
pH: 7 (ref 5.0–8.0)

## 2017-01-22 LAB — COMPREHENSIVE METABOLIC PANEL
ALT: 55 U/L — ABNORMAL HIGH (ref 0–53)
AST: 33 U/L (ref 0–37)
Albumin: 4.6 g/dL (ref 3.5–5.2)
Alkaline Phosphatase: 52 U/L (ref 39–117)
BUN: 17 mg/dL (ref 6–23)
CO2: 30 mEq/L (ref 19–32)
Calcium: 9.3 mg/dL (ref 8.4–10.5)
Chloride: 101 mEq/L (ref 96–112)
Creatinine, Ser: 0.87 mg/dL (ref 0.40–1.50)
GFR: 95.24 mL/min (ref >60.00)
Glucose, Bld: 118 mg/dL — ABNORMAL HIGH (ref 70–99)
Potassium: 3.9 mEq/L (ref 3.5–5.1)
Sodium: 139 mEq/L (ref 135–145)
Total Bilirubin: 1.1 mg/dL (ref 0.2–1.2)
Total Protein: 7 g/dL (ref 6.0–8.3)

## 2017-01-22 LAB — PSA: PSA: 1.15 ng/mL (ref 0.10–4.00)

## 2017-01-22 LAB — TSH: TSH: 1.88 u[IU]/mL (ref 0.35–4.50)

## 2017-01-22 MED ORDER — ROSUVASTATIN CALCIUM 10 MG PO TABS: 10.0000 mg | ORAL_TABLET | Freq: Every day | ORAL | 3 refills | Status: DC

## 2017-01-22 MED ORDER — VALSARTAN 320 MG PO TABS: 320.0000 mg | ORAL_TABLET | Freq: Every day | ORAL | 1 refills | Status: DC

## 2017-01-22 MED ORDER — CHLORTHALIDONE 25 MG PO TABS: 25.0000 mg | ORAL_TABLET | Freq: Every day | ORAL | 1 refills | Status: DC

## 2017-01-22 MED ORDER — ASPIRIN EC 81 MG PO TBEC: 81.0000 mg | DELAYED_RELEASE_TABLET | Freq: Every day | ORAL | 3 refills | Status: DC

## 2017-01-22 NOTE — Progress Notes (Signed)
Subjective:  Patient ID: Alex [name], male    DOB: [birth year]  Age: 61 y.o. MRN: [id number]  CC: Annual Exam; Hypertension; and Hyperlipidemia   HPI RYATT CORSINO presents for a CPX.  He returns for blood pressure check. He doesn't monitor his blood pressure at home. He admits that his wife complains about his snoring. He's had no recent episodes of headache, blurred vision, CP, DOE, SOB, palpitations, edema, or fatigue.  Outpatient Medications Prior to Visit  Medication Sig Dispense Refill  . Cyanocobalamin (VITAMIN B-12 IJ) Inject as directed. monthly    . diltiazem (CARDIZEM CD) 240 MG 24 hr capsule take 1 capsule by mouth once daily 90 capsule 1  . esomeprazole (NEXIUM) 40 MG capsule take 1 capsule by mouth every morning BEFORE BREAKFAST 30 capsule 11  . fexofenadine (ALLEGRA) 180 MG tablet Take 180 mg by mouth daily.      . meloxicam (MOBIC) 15 MG tablet Take 15 mg by mouth daily.    . traMADol (ULTRAM) 50 MG tablet take 1 tablet by mouth once daily if needed for pain  0  . GLUCOSAMINE HCL PO Take by mouth.    . hydrochlorothiazide (MICROZIDE) 12.5 MG capsule take 1 capsule by mouth once daily 90 capsule 1  . losartan (COZAAR) 50 MG tablet Take 1 tablet (50 mg total) by mouth daily. 90 tablet 1   Facility-Administered Medications Prior to Visit  Medication Dose Route Frequency Provider Last Rate Last Dose  . cyanocobalamin ((VITAMIN B-12)) injection 1,000 mcg  1,000 mcg Intramuscular Q30 days Hendricks Limes, MD   1,000 mcg at 10/18/16 1445    ROS Review of Systems  Constitutional: Negative.  Negative for activity change, chills, diaphoresis, fatigue and unexpected weight change.  HENT: Negative.   Eyes: Negative.  Negative for visual disturbance.  Respiratory: Negative for cough, chest tightness, shortness of breath, wheezing and stridor.   Cardiovascular: Negative for chest pain, palpitations and leg swelling.  Gastrointestinal: Negative for abdominal pain,  blood in stool, constipation, diarrhea, nausea and vomiting.  Endocrine: Negative.   Genitourinary: Negative for difficulty urinating, dysuria, frequency, hematuria, penile pain, penile swelling, scrotal swelling and urgency.  Musculoskeletal: Negative.  Negative for back pain and myalgias.  Skin: Negative.   Allergic/Immunologic: Negative.   Neurological: Negative.  Negative for dizziness, tremors, weakness, light-headedness and headaches.  Hematological: Negative for adenopathy. Does not bruise/bleed easily.  Psychiatric/Behavioral: Negative.     Objective:  BP (!) 162/106 (BP Location: Right Arm, Patient Position: Sitting, Cuff Size: Normal)   Pulse 80   Temp 97.6 F (36.4 C) (Oral)   Resp 16   Ht 5\' 10"  (1.778 m)   Wt 214 lb 6 oz (97.2 kg)   SpO2 97%   BMI 30.76 kg/m   BP Readings from Last 3 Encounters:  01/22/17 (!) 162/106  07/24/16 (!) 142/92  02/15/16 140/80    Wt Readings from Last 3 Encounters:  01/22/17 214 lb 6 oz (97.2 kg)  07/24/16 214 lb (97.1 kg)  02/15/16 216 lb 12.8 oz (98.3 kg)    Physical Exam  Constitutional: He is oriented to person, place, and time. No distress.  HENT:  Mouth/Throat: Oropharynx is clear and moist. No oropharyngeal exudate.  Eyes: Conjunctivae are normal. Right eye exhibits no discharge. Left eye exhibits no discharge. No scleral icterus.  Neck: Normal range of motion. Neck supple. No JVD present. No tracheal deviation present. No thyromegaly present.  Cardiovascular: Normal rate, regular rhythm, normal heart sounds and  intact distal pulses.  Exam reveals no gallop and no friction rub.   No murmur heard. EKG ---  Sinus  Rhythm  WITHIN NORMAL LIMITS   Pulmonary/Chest: Effort normal and breath sounds normal. No stridor. No respiratory distress. He has no wheezes. He has no rales. He exhibits no tenderness.  Abdominal: Soft. Bowel sounds are normal. He exhibits no distension and no mass. There is no tenderness. There is no rebound  and no guarding. Hernia confirmed negative in the right inguinal area and confirmed negative in the left inguinal area.  Genitourinary: Rectum normal, testes normal and penis normal. Rectal exam shows no external hemorrhoid, no internal hemorrhoid, no fissure, no mass, no tenderness, anal tone normal and guaiac negative stool. Prostate is enlarged (1+ smooth symm BPH). Prostate is not tender. Right testis shows no mass, no swelling and no tenderness. Right testis is descended. Cremasteric reflex is not absent on the right side. Left testis shows no mass, no swelling and no tenderness. Left testis is descended. Cremasteric reflex is not absent on the left side. Circumcised. No penile erythema or penile tenderness. No discharge found.  Musculoskeletal: Normal range of motion. He exhibits no edema, tenderness or deformity.  Lymphadenopathy:    He has no cervical adenopathy.       Right: No inguinal adenopathy present.       Left: No inguinal adenopathy present.  Neurological: He is alert and oriented to person, place, and time.  Skin: Skin is warm and dry. No rash noted. He is not diaphoretic. No erythema. No pallor.  Vitals reviewed.   Lab Results  Component Value Date   WBC 5.2 01/22/2017   HGB 16.7 01/22/2017   HCT 47.7 01/22/2017   PLT 181.0 01/22/2017   GLUCOSE 118 (H) 01/22/2017   CHOL 173 01/22/2017   TRIG 96.0 01/22/2017   HDL 41.90 01/22/2017   LDLDIRECT 146.5 12/05/2010   LDLCALC 111 (H) 01/22/2017   ALT 55 (H) 01/22/2017   AST 33 01/22/2017   NA 139 01/22/2017   K 3.9 01/22/2017   CL 101 01/22/2017   CREATININE 0.87 01/22/2017   BUN 17 01/22/2017   CO2 30 01/22/2017   TSH 1.88 01/22/2017   PSA 1.15 01/22/2017   HGBA1C 5.6 07/24/2016    Dg Finger Ring Right  Result Date: 08/13/2013 CLINICAL DATA:  Injury. EXAM: RIGHT RING FINGER 2+V COMPARISON:  None. FINDINGS: Soft tissue injury right 4th finger distal phalanx level without underlying fracture or dislocation.  IMPRESSION: Soft tissue injury right 4th finger distal phalanx level without underlying fracture or dislocation. Electronically Signed   By: Chauncey Cruel M.D.   On: 08/13/2013 12:50    Assessment & Plan:   Darly was seen today for annual exam, hypertension and hyperlipidemia.  Diagnoses and all orders for this visit:  Essential hypertension- his blood pressure is not adequately well controlled on 3 agents so I have asked him to be screened for sleep apnea. His EKG is negative for LVH, cardiac dysrhythmia or ischemia. For now he will continue to CCB at the current dose but I've asked him to upgrade to more potent ARB and thiazide diuretic -     valsartan (DIOVAN) 320 MG tablet; Take 1 tablet (320 mg total) by mouth daily. -     chlorthalidone (HYGROTON) 25 MG tablet; Take 1 tablet (25 mg total) by mouth daily. -     EKG 12-Lead  B12 deficiency- he is doing well on the current dose of B12 replacement therapy.  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 -     Homocysteine; Future  Snoring -     Ambulatory referral to Sleep Studies  Routine general medical examination at a health care facility- exam completed, labs reviewed, he refused a flu vaccine, colonoscopy is up-to-date, patient education material was given. -     Lipid panel; Future -     Comprehensive metabolic panel; Future -     CBC with Differential/Platelet; Future -     TSH; Future -     Urinalysis, Routine w reflex microscopic; Future -     PSA; Future  Hyperlipidemia with target LDL less than 100- his Framingham risk score is elevated to 20% so I have asked him to start taking a statin and a baby aspirin a day for cardiovascular risk reduction. -     rosuvastatin (CRESTOR) 10 MG tablet; Take 1 tablet (10 mg total) by mouth daily. -     aspirin EC 81 MG tablet; Take 1 tablet (81 mg total) by mouth daily.   I have discontinued Mr. Stettler GLUCOSAMINE HCL PO,  hydrochlorothiazide, and losartan. I am also having him start on valsartan, chlorthalidone, rosuvastatin, and aspirin EC. Additionally, I am having him maintain his fexofenadine, meloxicam, Cyanocobalamin (VITAMIN B-12 IJ), esomeprazole, traMADol, and diltiazem. We will continue to administer cyanocobalamin.  Meds ordered this encounter  Medications  . valsartan (DIOVAN) 320 MG tablet    Sig: Take 1 tablet (320 mg total) by mouth daily.    Dispense:  90 tablet    Refill:  1  . chlorthalidone (HYGROTON) 25 MG tablet    Sig: Take 1 tablet (25 mg total) by mouth daily.    Dispense:  90 tablet    Refill:  1  . rosuvastatin (CRESTOR) 10 MG tablet    Sig: Take 1 tablet (10 mg total) by mouth daily.    Dispense:  90 tablet    Refill:  3  . aspirin EC 81 MG tablet    Sig: Take 1 tablet (81 mg total) by mouth daily.    Dispense:  90 tablet    Refill:  3     Follow-up: Return in about 2 months (around 03/24/2017).  Scarlette Calico, MD

## 2017-01-22 NOTE — Patient Instructions (Signed)

## 2017-01-22 NOTE — Progress Notes (Signed)
Pre visit review using our clinic review tool, if applicable. No additional management support is needed unless otherwise documented below in the visit note. 

## 2017-01-23 LAB — HOMOCYSTEINE: Homocysteine: 9.8 umol/L (ref <11.4)

## 2017-01-28 ENCOUNTER — Other Ambulatory Visit: Payer: Self-pay | Admitting: Internal Medicine

## 2017-01-28 DIAGNOSIS — I10 Essential (primary) hypertension: Secondary | ICD-10-CM

## 2017-02-06 DIAGNOSIS — M170 Bilateral primary osteoarthritis of knee: Secondary | ICD-10-CM | POA: Diagnosis not present

## 2017-02-06 DIAGNOSIS — M1711 Unilateral primary osteoarthritis, right knee: Secondary | ICD-10-CM | POA: Diagnosis not present

## 2017-02-06 DIAGNOSIS — M25561 Pain in right knee: Secondary | ICD-10-CM | POA: Diagnosis not present

## 2017-02-06 DIAGNOSIS — M25562 Pain in left knee: Secondary | ICD-10-CM | POA: Diagnosis not present

## 2017-02-06 DIAGNOSIS — M1712 Unilateral primary osteoarthritis, left knee: Secondary | ICD-10-CM | POA: Diagnosis not present

## 2017-02-12 ENCOUNTER — Ambulatory Visit (INDEPENDENT_AMBULATORY_CARE_PROVIDER_SITE_OTHER): Payer: BLUE CROSS/BLUE SHIELD

## 2017-02-12 DIAGNOSIS — E538 Deficiency of other specified B group vitamins: Secondary | ICD-10-CM

## 2017-02-12 MED ORDER — CYANOCOBALAMIN 1000 MCG/ML IJ SOLN
1000.0000 ug | Freq: Once | INTRAMUSCULAR | Status: AC
  Administered 2017-02-12: 1000 ug via INTRAMUSCULAR

## 2017-02-12 NOTE — Progress Notes (Signed)
b12 shot 

## 2017-02-25 ENCOUNTER — Other Ambulatory Visit: Payer: Self-pay | Admitting: Internal Medicine

## 2017-02-25 DIAGNOSIS — I10 Essential (primary) hypertension: Secondary | ICD-10-CM

## 2017-03-06 ENCOUNTER — Ambulatory Visit (INDEPENDENT_AMBULATORY_CARE_PROVIDER_SITE_OTHER): Payer: BLUE CROSS/BLUE SHIELD | Admitting: Neurology

## 2017-03-06 ENCOUNTER — Encounter: Payer: Self-pay | Admitting: Neurology

## 2017-03-06 VITALS — BP 142/70 | HR 80 | Resp 16 | Ht 70.0 in | Wt 216.0 lb

## 2017-03-06 DIAGNOSIS — R0683 Snoring: Secondary | ICD-10-CM

## 2017-03-06 DIAGNOSIS — R351 Nocturia: Secondary | ICD-10-CM

## 2017-03-06 DIAGNOSIS — E669 Obesity, unspecified: Secondary | ICD-10-CM | POA: Diagnosis not present

## 2017-03-06 DIAGNOSIS — R0681 Apnea, not elsewhere classified: Secondary | ICD-10-CM | POA: Diagnosis not present

## 2017-03-06 NOTE — Progress Notes (Signed)
Subjective:    Patient ID: Alex [name] is a 61 y.o. male.  HPI     Star Age, MD, PhD [facility] [address] [place], [place] [address]  Dear Dr. Ronnald Ramp,   I saw your patient, Alex [name], upon your kind request, in my neurologic clinic today for initial consultation of his sleep disorder, in particular, concern for underlying obstructive sleep apnea. The patient is unaccompanied today. As you know, [name] is a 61 year old right-handed gentleman with an underlying medical history of hypertension, hyperlipidemia, vitamin B-12 deficiency, reflux disease, and obesity, who reports snoring and excessive daytime somnolence, as well as recent difficulty controlling blood pressure despite taking 3 medications.  I reviewed your office note from 01/22/2017. He had recent changes in his blood pressure medications and numbers are better, he monitors his blood pressure at home as well.  His Epworth sleepiness score is 8 out of 24, fatigue score is 3 out of 63. He is married and lives with his wife. He has one daughter in [place], his son lives in [place]. His son and daughter-in-law are expecting their first child, patient's daughter has 3 children. He is a nonsmoker, does not drink alcohol and drinks caffeine in the form of coffee, 2 cups per day on average and soda 2 cans per day on average. He has been known to snore for years. His wife is also witnessed apneic pauses while he is asleep. He denies restless leg symptoms. He does have nocturia, once or twice per night on average. He denies morning headaches. The time is around 11 PM, wakeup time at 5 AM. He denies sleep talking or other parasomnias.  His Past Medical History Is Significant For: Past Medical History:  Diagnosis Date  . Achilles tendinitis    PMH of  . B12 deficiency   . Barrett's esophagus   . Diverticulosis of colon (without mention of hemorrhage) 2010  . Fatty  liver 10-16-2011   [place]  . GERD (gastroesophageal reflux disease)   . Gilbert syndrome   . Hiatal hernia   . Homocystinemia (Gilbert)   . Hyperlipidemia   . Hypertension   . Nonspecific elevation of levels of transaminase or lactic acid dehydrogenase (LDH)    PMH of    His Past Surgical History Is Significant For: Past Surgical History:  Procedure Laterality Date  . COLONOSCOPY  2010   negative  . KNEE ARTHROSCOPY     R 2015 & L 2016; [facility]  . UPPER GASTROINTESTINAL ENDOSCOPY     Dr Sharlett Iles; multiple  snce 1992  . VASECTOMY    . WISDOM TOOTH EXTRACTION      His Family History Is Significant For: Family History  Problem Relation Age of Onset  . Esophageal cancer Father   . Stomach cancer Father   . Hypertension Father   . Heart disease Father        CBAG @ 19  . Hypertension Mother   . Heart attack Mother 47  . Cancer Mother   . Hypertension Sister   . Hypertension Sister   . Hypertension Sister   . Stroke Neg Hx   . Diabetes Neg Hx   . Early death Neg Hx   . Hyperlipidemia Neg Hx   . Kidney disease Neg Hx   . Alcohol abuse Neg Hx     His Social History Is Significant For: Social History   Social History  . Marital status: Married  Spouse name: N/A  . Number of children: 2  . Years of education: 12   Occupational History  . dry cleaners    Social History Main Topics  . Smoking status: Never Smoker  . Smokeless tobacco: Never Used  . Alcohol use No  . Drug use: No  . Sexual activity: Yes   Other Topics Concern  . None   Social History Narrative   Caffeine: Drinks 2 sodas a day    His Allergies Are:  No Known Allergies:   His Current Medications Are:  Outpatient Encounter Prescriptions as of 03/06/2017  Medication Sig  . CARTIA XT 240 MG 24 hr capsule take 1 capsule by mouth once daily  . chlorthalidone (HYGROTON) 25 MG tablet Take 1 tablet (25 mg total) by mouth daily.  . Cyanocobalamin (VITAMIN B-12 IJ) Inject as directed. monthly  .  esomeprazole (NEXIUM) 40 MG capsule take 1 capsule by mouth every morning BEFORE BREAKFAST  . fexofenadine (ALLEGRA) 180 MG tablet Take 180 mg by mouth daily.    . meloxicam (MOBIC) 15 MG tablet Take 15 mg by mouth daily.  . traMADol (ULTRAM) 50 MG tablet take 1 tablet by mouth once daily if needed for pain  . valsartan (DIOVAN) 320 MG tablet Take 1 tablet (320 mg total) by mouth daily.  . [DISCONTINUED] aspirin EC 81 MG tablet Take 1 tablet (81 mg total) by mouth daily.  . [DISCONTINUED] rosuvastatin (CRESTOR) 10 MG tablet Take 1 tablet (10 mg total) by mouth daily.   Facility-Administered Encounter Medications as of 03/06/2017  Medication  . cyanocobalamin ((VITAMIN B-12)) injection 1,000 mcg  : Review of Systems:  Out of a complete 14 point review of systems, all are reviewed and negative with the exception of these symptoms as listed below:  Review of Systems  Neurological:       Patient reports getting up a couple of times in the night to go to the bathroom, snores, witnessed apnea, denies taking naps.    Epworth Sleepiness Scale 0= would never doze 1= slight chance of dozing 2= moderate chance of dozing 3= high chance of dozing  Sitting and reading:1 Watching TV:2 Sitting inactive in a public place (ex. Theater or meeting):0 As a passenger in a car for an hour without a break:1 Lying down to rest in the afternoon:3  Sitting and talking to someone:0 Sitting quietly after lunch (no alcohol):1 In a car, while stopped in traffic:0 Total:8  Objective:  Neurologic Exam  Physical Exam Physical Examination:   Vitals:   03/06/17 1558  BP: (!) 142/70  Pulse: 80  Resp: 16    General Examination: The patient is a very pleasant 61 y.o. male in no acute distress. He appears well-developed and well-nourished and well groomed.   HEENT: Normocephalic, atraumatic, pupils are equal, round and reactive to light and accommodation. Funduscopic exam is normal with sharp disc margins  noted. Extraocular tracking is good without limitation to gaze excursion or nystagmus noted. Normal smooth pursuit is noted. Hearing is grossly intact. Tympanic membranes are clear bilaterally. Face is symmetric with normal facial animation and normal facial sensation. Speech is clear with no dysarthria noted. There is no hypophonia. There is no lip, neck/head, jaw or voice tremor. Neck is supple with full range of passive and active motion. There are no carotid bruits on auscultation. Oropharynx exam reveals: mild mouth dryness, adequate dental hygiene and mild airway crowding, due to redundant soft palate and mildly larger appearing uvula. Mallampati is class II.  Tongue protrudes centrally and palate elevates symmetrically. Tonsils are small. Neck size is 17.75 inches. He has a nearly absent overbite.    Chest: Clear to auscultation without wheezing, rhonchi or crackles noted.  Heart: S1+S2+0, regular and normal without murmurs, rubs or gallops noted.   Abdomen: Soft, non-tender and non-distended with normal bowel sounds appreciated on auscultation.  Extremities: There is no pitting edema in the distal lower extremities bilaterally. Pedal pulses are intact.  Skin: Warm and dry without trophic changes noted.  Musculoskeletal: exam reveals no obvious joint deformities, tenderness or joint swelling or erythema.   Neurologically:  Mental status: The patient is awake, alert and oriented in all 4 spheres. His immediate and remote memory, attention, language skills and fund of knowledge are appropriate. There is no evidence of aphasia, agnosia, apraxia or anomia. Speech is clear with normal prosody and enunciation. Thought process is linear. Mood is normal and affect is normal.  Cranial nerves II - XII are as described above under HEENT exam. In addition: shoulder shrug is normal with equal shoulder height noted. Motor exam: Normal bulk, strength and tone is noted. There is no drift, tremor or rebound.  Romberg is negative. Reflexes are 1+ throughout. Fine motor skills and coordination: intact with normal finger taps, normal hand movements, normal rapid alternating patting, normal foot taps and normal foot agility.  Cerebellar testing: No dysmetria or intention tremor on finger to nose testing. Heel to shin is unremarkable bilaterally. There is no truncal or gait ataxia.  Sensory exam: intact to light touch in the upper and lower extremities.  Gait, station and balance: He stands easily. No veering to one side is noted. No leaning to one side is noted. Posture is age-appropriate and stance is narrow based. Gait shows normal stride length and normal pace. No problems with turns. Tandem walk is unremarkable.       Assessment and Plan:   In summary, ELIS SAUBER is a very pleasant 61 y.o.-year old male with an underlying medical history of hypertension, hyperlipidemia, vitamin B-12 deficiency, reflux disease, and obesity, whose history and physical exam are concerning for obstructive sleep apnea (OSA). I had a long chat with the patient about my findings and the diagnosis of OSA, its prognosis and treatment options. We talked about medical treatments, surgical interventions and non-pharmacological approaches. I explained in particular the risks and ramifications of untreated moderate to severe OSA, especially with respect to developing cardiovascular disease down the Road, including congestive heart failure, difficult to treat hypertension, cardiac arrhythmias, or stroke. Even type 2 diabetes has, in part, been linked to untreated OSA. Symptoms of untreated OSA include daytime sleepiness, memory problems, mood irritability and mood disorder such as depression and anxiety, lack of energy, as well as recurrent headaches, especially morning headaches. We talked about trying to maintain a healthy lifestyle in general, as well as the importance of weight control. I encouraged the patient to eat healthy,  exercise daily and keep well hydrated, to keep a scheduled bedtime and wake time routine, to not skip any meals and eat healthy snacks in between meals. I advised the patient not to drive when feeling sleepy. I recommended the following at this time: sleep study with potential positive airway pressure titration. (We will score hypopneas at 3%).   I explained the sleep test procedure to the patient and also outlined possible surgical and non-surgical treatment options of OSA, including the use of a custom-made dental device (which would require a referral to a specialist  dentist or oral surgeon), upper airway surgical options, such as pillar implants, radiofrequency surgery, tongue base surgery, and UPPP (which would involve a referral to an ENT surgeon). Rarely, jaw surgery such as mandibular advancement may be considered.  I also explained the CPAP treatment option to the patient, who indicated that he would be willing to try CPAP if the need arises. I explained the importance of being compliant with PAP treatment, not only for insurance purposes but primarily to improve His symptoms, and for the patient's long term health benefit, including to reduce His cardiovascular risks. I answered all his questions today and the patient was in agreement. I would like to him see him back after the sleep study is completed and encouraged him to call with any interim questions, concerns, problems or updates.   Thank you very much for allowing me to participate in the care of this nice patient. If I can be of any further assistance to you please do not hesitate to call me at [phone].  Sincerely,   Star Age, MD, PhD

## 2017-03-06 NOTE — Patient Instructions (Signed)

## 2017-03-11 ENCOUNTER — Telehealth: Payer: Self-pay | Admitting: Neurology

## 2017-03-11 DIAGNOSIS — R0681 Apnea, not elsewhere classified: Secondary | ICD-10-CM

## 2017-03-11 DIAGNOSIS — R0683 Snoring: Secondary | ICD-10-CM

## 2017-03-11 DIAGNOSIS — E669 Obesity, unspecified: Secondary | ICD-10-CM

## 2017-03-11 NOTE — Telephone Encounter (Signed)
[employer] denied [place] but approved HST.  Can I get an order for HST?

## 2017-03-11 NOTE — Telephone Encounter (Signed)
Order has been placed.

## 2017-03-19 ENCOUNTER — Telehealth: Payer: Self-pay

## 2017-03-19 ENCOUNTER — Ambulatory Visit (INDEPENDENT_AMBULATORY_CARE_PROVIDER_SITE_OTHER): Payer: BLUE CROSS/BLUE SHIELD

## 2017-03-19 DIAGNOSIS — E538 Deficiency of other specified B group vitamins: Secondary | ICD-10-CM

## 2017-03-19 MED ORDER — CYANOCOBALAMIN 1000 MCG/ML IJ SOLN: 1000.0000 ug | Freq: Once | INTRAMUSCULAR | Status: DC

## 2017-03-19 NOTE — Telephone Encounter (Signed)
yes

## 2017-03-19 NOTE — Telephone Encounter (Signed)
Routing to dr jones---patient is coming in today (nurse visit) for b12 injection---he already has office visit scheduled with you on 5/29---are you ok with giving b12 injection today and retesting for b12 lab value at office visit on 5/29??---please advise, I will let patient know, thanks

## 2017-03-25 ENCOUNTER — Encounter: Payer: Self-pay | Admitting: Internal Medicine

## 2017-03-25 ENCOUNTER — Other Ambulatory Visit (INDEPENDENT_AMBULATORY_CARE_PROVIDER_SITE_OTHER): Payer: BLUE CROSS/BLUE SHIELD

## 2017-03-25 ENCOUNTER — Ambulatory Visit (INDEPENDENT_AMBULATORY_CARE_PROVIDER_SITE_OTHER): Payer: BLUE CROSS/BLUE SHIELD | Admitting: Internal Medicine

## 2017-03-25 VITALS — BP 134/88 | HR 91 | Temp 98.3°F | Resp 16 | Ht 70.0 in | Wt 219.8 lb

## 2017-03-25 DIAGNOSIS — E876 Hypokalemia: Secondary | ICD-10-CM | POA: Diagnosis not present

## 2017-03-25 DIAGNOSIS — I10 Essential (primary) hypertension: Secondary | ICD-10-CM

## 2017-03-25 DIAGNOSIS — E538 Deficiency of other specified B group vitamins: Secondary | ICD-10-CM

## 2017-03-25 LAB — BASIC METABOLIC PANEL
BUN: 32 mg/dL — ABNORMAL HIGH (ref 6–23)
CO2: 28 mEq/L (ref 19–32)
Calcium: 9.7 mg/dL (ref 8.4–10.5)
Chloride: 103 mEq/L (ref 96–112)
Creatinine, Ser: 1.1 mg/dL (ref 0.40–1.50)
GFR: 72.61 mL/min (ref >60.00)
Glucose, Bld: 90 mg/dL (ref 70–99)
Potassium: 3.3 mEq/L — ABNORMAL LOW (ref 3.5–5.1)
Sodium: 140 mEq/L (ref 135–145)

## 2017-03-25 MED ORDER — POTASSIUM CHLORIDE CRYS ER 20 MEQ PO TBCR: 20.0000 meq | EXTENDED_RELEASE_TABLET | Freq: Two times a day (BID) | ORAL | 1 refills | Status: DC

## 2017-03-25 NOTE — Patient Instructions (Signed)

## 2017-03-25 NOTE — Progress Notes (Signed)
Subjective:  Patient ID: Alex [name], male    DOB: 03/05/1956  Age: 61 y.o. MRN: [id number]  CC: Hypertension   HPI Alex [name] presents for a BP check - He tells me his blood pressure has been well controlled. Other than heavy snoring and possible sleep apnea he has felt well lately and offers no new complaints. He is scheduled for an in-home sleep test within the next few weeks.  Outpatient Medications Prior to Visit  Medication Sig Dispense Refill  . CARTIA XT 240 MG 24 hr capsule take 1 capsule by mouth once daily 30 capsule 5  . chlorthalidone (HYGROTON) 25 MG tablet Take 1 tablet (25 mg total) by mouth daily. 90 tablet 1  . Cyanocobalamin (VITAMIN B-12 IJ) Inject as directed. monthly    . esomeprazole (NEXIUM) 40 MG capsule take 1 capsule by mouth every morning BEFORE BREAKFAST 30 capsule 6  . fexofenadine (ALLEGRA) 180 MG tablet Take 180 mg by mouth daily.      . meloxicam (MOBIC) 15 MG tablet Take 15 mg by mouth daily.    . traMADol (ULTRAM) 50 MG tablet take 1 tablet by mouth once daily if needed for pain  0  . valsartan (DIOVAN) 320 MG tablet Take 1 tablet (320 mg total) by mouth daily. 90 tablet 1   Facility-Administered Medications Prior to Visit  Medication Dose Route Frequency Provider Last Rate Last Dose  . cyanocobalamin ((VITAMIN B-12)) injection 1,000 mcg  1,000 mcg Intramuscular Q30 days Hendricks Limes, MD   1,000 mcg at 03/19/17 1437    ROS Review of Systems  Constitutional: Negative.  Negative for diaphoresis, fatigue and unexpected weight change.  HENT: Negative.   Eyes: Negative for visual disturbance.  Respiratory: Positive for apnea. Negative for cough, chest tightness, shortness of breath and wheezing.   Cardiovascular: Negative for chest pain, palpitations and leg swelling.  Gastrointestinal: Negative for abdominal pain, constipation, diarrhea, nausea and vomiting.  Endocrine: Negative.   Genitourinary: Negative.  Negative for difficulty  urinating.  Musculoskeletal: Negative.  Negative for back pain and neck pain.  Skin: Negative.   Allergic/Immunologic: Negative.   Neurological: Negative.  Negative for dizziness, syncope, weakness, light-headedness and headaches.  Hematological: Negative for adenopathy. Does not bruise/bleed easily.  Psychiatric/Behavioral: Negative.     Objective:  BP 134/88 (BP Location: Left Arm, Patient Position: Sitting, Cuff Size: Large)   Pulse 91   Temp 98.3 F (36.8 C) (Oral)   Resp 16   Ht 5' 10"  (1.778 m)   Wt 219 lb 12 oz (99.7 kg)   SpO2 97%   BMI 31.53 kg/m   BP Readings from Last 3 Encounters:  03/25/17 134/88  03/06/17 (!) 142/70  01/22/17 (!) 162/106    Wt Readings from Last 3 Encounters:  03/25/17 219 lb 12 oz (99.7 kg)  03/06/17 216 lb (98 kg)  01/22/17 214 lb 6 oz (97.2 kg)    Physical Exam  Constitutional: He is oriented to person, place, and time. No distress.  HENT:  Mouth/Throat: Oropharynx is clear and moist. No oropharyngeal exudate.  Eyes: Conjunctivae are normal. Right eye exhibits no discharge. Left eye exhibits no discharge. No scleral icterus.  Neck: Normal range of motion. Neck supple.  Cardiovascular: Normal rate, regular rhythm, normal heart sounds and intact distal pulses.  Exam reveals no gallop and no friction rub.   No murmur heard. Pulmonary/Chest: Effort normal and breath sounds normal. No respiratory distress. He has no wheezes. He has no rales.  He exhibits no tenderness.  Abdominal: Soft. Bowel sounds are normal. He exhibits no distension and no mass. There is no tenderness. There is no rebound and no guarding.  Musculoskeletal: Normal range of motion. He exhibits no edema, tenderness or deformity.  Neurological: He is oriented to person, place, and time.  Skin: Skin is warm and dry. No rash noted. He is not diaphoretic. No erythema. No pallor.  Vitals reviewed.   Lab Results  Component Value Date   WBC 5.2 01/22/2017   HGB 16.7 01/22/2017    HCT 47.7 01/22/2017   PLT 181.0 01/22/2017   GLUCOSE 90 03/25/2017   CHOL 173 01/22/2017   TRIG 96.0 01/22/2017   HDL 41.90 01/22/2017   LDLDIRECT 146.5 12/05/2010   LDLCALC 111 (H) 01/22/2017   ALT 55 (H) 01/22/2017   AST 33 01/22/2017   NA 140 03/25/2017   K 3.3 (L) 03/25/2017   CL 103 03/25/2017   CREATININE 1.10 03/25/2017   BUN 32 (H) 03/25/2017   CO2 28 03/25/2017   TSH 1.88 01/22/2017   PSA 1.15 01/22/2017   HGBA1C 5.6 07/24/2016    Dg Finger Ring Right  Result Date: 08/13/2013 CLINICAL DATA:  Injury. EXAM: RIGHT RING FINGER 2+V COMPARISON:  None. FINDINGS: Soft tissue injury right 4th finger distal phalanx level without underlying fracture or dislocation. IMPRESSION: Soft tissue injury right 4th finger distal phalanx level without underlying fracture or dislocation. Electronically Signed   By: Chauncey Cruel M.D.   On: 08/13/2013 12:50    Assessment & Plan:   Itai was seen today for hypertension.  Diagnoses and all orders for this visit:  B12 deficiency- will continue monthly B12 replacement therapy  Essential hypertension- His blood pressure is well-controlled and he is tolerating this regimen well. The thiazide diuretic has caused hypokalemia so will start a potassium replacement. -     Basic metabolic panel; Future -     potassium chloride SA (K-DUR,KLOR-CON) 20 MEQ tablet; Take 1 tablet (20 mEq total) by mouth 2 (two) times daily after a meal.  Hypokalemia -     potassium chloride SA (K-DUR,KLOR-CON) 20 MEQ tablet; Take 1 tablet (20 mEq total) by mouth 2 (two) times daily after a meal.   I am having Mr. Tumlin start on potassium chloride SA. I am also having him maintain his fexofenadine, meloxicam, Cyanocobalamin (VITAMIN B-12 IJ), traMADol, valsartan, chlorthalidone, CARTIA XT, and esomeprazole. We will continue to administer cyanocobalamin.  Meds ordered this encounter  Medications  . potassium chloride SA (K-DUR,KLOR-CON) 20 MEQ tablet    Sig:  Take 1 tablet (20 mEq total) by mouth 2 (two) times daily after a meal.    Dispense:  180 tablet    Refill:  1     Follow-up: Return in about 6 months (around 09/25/2017).  Scarlette Calico, MD

## 2017-04-02 ENCOUNTER — Ambulatory Visit (INDEPENDENT_AMBULATORY_CARE_PROVIDER_SITE_OTHER): Payer: BLUE CROSS/BLUE SHIELD | Admitting: Neurology

## 2017-04-02 DIAGNOSIS — G4733 Obstructive sleep apnea (adult) (pediatric): Secondary | ICD-10-CM

## 2017-04-02 DIAGNOSIS — G4734 Idiopathic sleep related nonobstructive alveolar hypoventilation: Secondary | ICD-10-CM

## 2017-04-07 NOTE — Progress Notes (Signed)
Patient referred by Dr. Ronnald Ramp, seen by me on 03/06/17, HST on 04/02/17:  Please call and notify the patient that the recent home sleep test did suggest the diagnosis of severe obstructive sleep apnea and that I recommend treatment for this in the form of CPAP. I will request an overnight sleep study for proper titration and mask fitting. Please explain to patient and arrange for a CPAP titration study. I have placed an order in the chart. Thanks, and please route to [facility] for scheduling.   Star Age, MD, PhD [facility])

## 2017-04-07 NOTE — Procedures (Signed)
  [facility] @[facility] [id number] [place], [place] [address] NAME: Alex [name] DOB: 25-Nov-1955 MEDICAL RECORD [id number] DOS: 04-02-17 REFERRING PHYSICIAN: Scarlette Calico, MD Study performed: HST HISTORY: 61 year old man with a history of hypertension, hyperlipidemia, vitamin B-12 deficiency, reflux disease, and obesity, who reports snoring, witnessed apneas, excessive daytime somnolence, as well as recent difficulty controlling blood pressure despite taking 3 medications.  His Epworth sleepiness score is 8 out of 24; BMI of 31.  STUDY RESULTS: Total Recording Time: 7h 57min (duration of 4 h, 13 min) Total Apnea/Hypopnea Index (AHI): 29.7/hour Average Oxygen Saturation: 90% Lowest Oxygen Saturation: 74% Time below 88%: 93 min Average Heart Rate: 62 bpm  IMPRESSION: Obstructive Sleep Apnea OSA; nocturnal hypoxemia RECOMMENDATION: This home sleep test demonstrates severe obstructive sleep apnea with a total AHI of 29.7/hour and O2 nadir of 74%. Given the patient's medical history and sleep related complaints, treatment with positive airway pressure (in the form of CPAP) is recommended. This will require a full night CPAP titration study for proper treatment settings and mask fitting. Please note that untreated obstructive sleep apnea carries additional perioperative morbidity. Patients with significant obstructive sleep apnea should receive perioperative PAP therapy and the surgeons and particularly the anesthesiologist should be informed of the diagnosis and the severity of the sleep disordered breathing. The patient should be cautioned not to drive, work at heights, or operate dangerous or heavy equipment when tired or sleepy. Review and reiteration of good sleep hygiene measures should be pursued with any patient. The patient and his referring provider will be notified of the test results. The patient will be seen in follow up in sleep clinic at [facility].  I certify  that I have reviewed the raw data recording prior to the issuance of this report in accordance with the standards of Accreditation of the American Academy of Sleep medicine (AASM) Star Age, MD, PhD [facility]) Diplomat, ABPN (neurology and sleep)

## 2017-04-07 NOTE — Addendum Note (Signed)
Addended by: Star Age on: 04/07/2017 06:21 PM   Modules accepted: Orders

## 2017-04-10 ENCOUNTER — Telehealth: Payer: Self-pay

## 2017-04-10 NOTE — Telephone Encounter (Signed)
-----   Message from Star Age, MD sent at 04/07/2017  6:21 PM EDT ----- Patient referred by Dr. Ronnald Ramp, seen by me on 03/06/17, HST on 04/02/17:  Please call and notify the patient that the recent home sleep test did suggest the diagnosis of severe obstructive sleep apnea and that I recommend treatment for this in the form of CPAP. I will request an overnight sleep study for proper titration and mask fitting. Please explain to patient and arrange for a CPAP titration study. I have placed an order in the chart. Thanks, and please route to [facility] for scheduling.   Star Age, MD, PhD [facility])

## 2017-04-10 NOTE — Telephone Encounter (Signed)
I called Alex [name]. I advised Alex [name] that Dr. Rexene Alberts reviewed their sleep study results and it does suggest that Alex [name] has severe osa and recommends treatment in the form of a cpap. Dr. Rexene Alberts recommends that Alex [name] return for a repeat sleep study in order to properly titrate the cpap and ensure a good mask fit. Alex [name] is agreeable to returning for a titration study. I advised Alex [name] that our sleep lab will file with Alex [name]'s insurance and call Alex [name] to schedule the sleep study when we hear back from the Alex [name]'s insurance regarding coverage of this sleep study. Alex [name] verbalized understanding of results. Alex [name] had no questions at this time but was encouraged to call back if questions arise.

## 2017-04-16 ENCOUNTER — Telehealth: Payer: Self-pay | Admitting: Neurology

## 2017-04-16 DIAGNOSIS — G4733 Obstructive sleep apnea (adult) (pediatric): Secondary | ICD-10-CM

## 2017-04-16 NOTE — Telephone Encounter (Signed)
[employer] denied CPAP approved auto pap

## 2017-04-17 NOTE — Telephone Encounter (Signed)
We will set patient up with autoPAP at home, as insurance denied in house titration study for OSA. Pls process order and notify patient and set up FU in 10 weeks with me or NP.     

## 2017-04-21 NOTE — Telephone Encounter (Signed)
I called pt. I advised pt that [employer] denied his titration study. Dr. Rexene Alberts recommends that pt start an auto pap at home. I reviewed PAP compliance expectations with the pt. Pt is agreeable to starting a CPAP. I advised pt that an order will be sent to a DME, [facility], and [facility] will call the pt within about one week after they file with the pt's insurance. [facility] will show the pt how to use the machine, fit for masks, and troubleshoot the CPAP if needed. A follow up appt was made for insurance purposes with Dr. Rexene Alberts on 07/03/2017 at 3:00pm. Pt verbalized understanding to arrive 15 minutes early and bring their CPAP. A letter with all of this information in it will be mailed to the pt as a reminder. I verified with the pt that the address we have on file is correct. Pt verbalized understanding of results. Pt had no questions at this time but was encouraged to call back if questions arise.

## 2017-04-23 ENCOUNTER — Ambulatory Visit (INDEPENDENT_AMBULATORY_CARE_PROVIDER_SITE_OTHER): Payer: BLUE CROSS/BLUE SHIELD

## 2017-04-23 DIAGNOSIS — E538 Deficiency of other specified B group vitamins: Secondary | ICD-10-CM | POA: Diagnosis not present

## 2017-04-23 MED ORDER — CYANOCOBALAMIN 1000 MCG/ML IJ SOLN
1000.0000 ug | Freq: Once | INTRAMUSCULAR | Status: AC
  Administered 2017-04-23: 1000 ug via INTRAMUSCULAR

## 2017-04-23 NOTE — Progress Notes (Signed)
Injection given.   Alex Wamser J Birtie Fellman, MD  

## 2017-05-07 DIAGNOSIS — G4733 Obstructive sleep apnea (adult) (pediatric): Secondary | ICD-10-CM | POA: Diagnosis not present

## 2017-05-15 DIAGNOSIS — M1712 Unilateral primary osteoarthritis, left knee: Secondary | ICD-10-CM | POA: Diagnosis not present

## 2017-05-15 DIAGNOSIS — G8929 Other chronic pain: Secondary | ICD-10-CM | POA: Diagnosis not present

## 2017-05-15 DIAGNOSIS — M170 Bilateral primary osteoarthritis of knee: Secondary | ICD-10-CM | POA: Diagnosis not present

## 2017-05-15 DIAGNOSIS — M25561 Pain in right knee: Secondary | ICD-10-CM | POA: Diagnosis not present

## 2017-05-15 DIAGNOSIS — M25562 Pain in left knee: Secondary | ICD-10-CM | POA: Diagnosis not present

## 2017-05-27 ENCOUNTER — Other Ambulatory Visit: Payer: Self-pay | Admitting: Internal Medicine

## 2017-05-27 ENCOUNTER — Telehealth: Payer: Self-pay

## 2017-05-27 DIAGNOSIS — I10 Essential (primary) hypertension: Secondary | ICD-10-CM

## 2017-05-27 MED ORDER — LOSARTAN POTASSIUM 100 MG PO TABS: 100.0000 mg | ORAL_TABLET | Freq: Every day | ORAL | 1 refills | Status: DC

## 2017-05-27 NOTE — Telephone Encounter (Signed)
Fax from [facility] requesting to fill rx for losartan until the valsartan is available. Please advise.

## 2017-05-29 ENCOUNTER — Ambulatory Visit (INDEPENDENT_AMBULATORY_CARE_PROVIDER_SITE_OTHER): Payer: BLUE CROSS/BLUE SHIELD

## 2017-05-29 DIAGNOSIS — E538 Deficiency of other specified B group vitamins: Secondary | ICD-10-CM

## 2017-05-29 MED ORDER — CYANOCOBALAMIN 1000 MCG/ML IJ SOLN
1000.0000 ug | Freq: Once | INTRAMUSCULAR | Status: AC
  Administered 2017-05-29: 1000 ug via INTRAMUSCULAR

## 2017-06-07 DIAGNOSIS — G4733 Obstructive sleep apnea (adult) (pediatric): Secondary | ICD-10-CM | POA: Diagnosis not present

## 2017-07-01 ENCOUNTER — Encounter: Payer: Self-pay | Admitting: Neurology

## 2017-07-03 ENCOUNTER — Encounter: Payer: Self-pay | Admitting: Neurology

## 2017-07-03 ENCOUNTER — Ambulatory Visit (INDEPENDENT_AMBULATORY_CARE_PROVIDER_SITE_OTHER): Payer: BLUE CROSS/BLUE SHIELD | Admitting: Neurology

## 2017-07-03 ENCOUNTER — Ambulatory Visit (INDEPENDENT_AMBULATORY_CARE_PROVIDER_SITE_OTHER): Payer: BLUE CROSS/BLUE SHIELD | Admitting: General Practice

## 2017-07-03 VITALS — BP 138/90 | HR 83 | Ht 70.0 in | Wt 217.0 lb

## 2017-07-03 DIAGNOSIS — Z9989 Dependence on other enabling machines and devices: Secondary | ICD-10-CM | POA: Diagnosis not present

## 2017-07-03 DIAGNOSIS — G4733 Obstructive sleep apnea (adult) (pediatric): Secondary | ICD-10-CM

## 2017-07-03 DIAGNOSIS — G4734 Idiopathic sleep related nonobstructive alveolar hypoventilation: Secondary | ICD-10-CM | POA: Diagnosis not present

## 2017-07-03 DIAGNOSIS — E538 Deficiency of other specified B group vitamins: Secondary | ICD-10-CM

## 2017-07-03 MED ORDER — CYANOCOBALAMIN 1000 MCG/ML IJ SOLN
1000.0000 ug | Freq: Once | INTRAMUSCULAR | Status: AC
  Administered 2017-07-03: 1000 ug via INTRAMUSCULAR

## 2017-07-03 NOTE — Patient Instructions (Addendum)
You have done very well with your autoPAP machine and I commend you for this! Please continue using your autoPAP regularly. While your insurance requires that you use PAP at least 4 hours each night on 70% of the nights, I recommend, that you not skip any nights and use it throughout the night if you can. Getting used to PAP and staying with the treatment long term does take time and patience and discipline. Untreated obstructive sleep apnea when it is moderate to severe can have an adverse impact on cardiovascular health and raise her risk for heart disease, arrhythmias, hypertension, congestive heart failure, stroke and diabetes. Untreated obstructive sleep apnea causes sleep disruption, nonrestorative sleep, and sleep deprivation. This can have an impact on your day to day functioning and cause daytime sleepiness and impairment of cognitive function, memory loss, mood disturbance, and problems focussing. Using PAP regularly can improve these symptoms.  You do not have to bring your machine for the next appointment in about 6 months, with NP.  As discussed, we will do an overnight oxygen level test, called ONO, and your DME company will call and set this up for one night, while you also use your autoPAP as usual. We will call you with the results. This is to make sure that your oxygen levels stay in the 90s, while you are treated with autoPAP for your OSA. Remember, your oxygen levels dropped into the 70s during the home sleep test.

## 2017-07-03 NOTE — Progress Notes (Signed)
Subjective:    Patient ID: Alex [name] is a 61 y.o. male.  HPI     Interim history:   Alex [name] is a 61 year old right-handed gentleman with an underlying medical history of hypertension, hyperlipidemia, vitamin B-12 deficiency, reflux disease, and obesity, who presents for follow-up consultation of his sleep apnea, after recent sleep study testing and starting AutoPap therapy. The patient is unaccompanied today. I first met him on 03/06/2017 at the request of his primary care physician, at which time the patient reported difficult to control pressure despite 3 meds, snoring and daytime somnolence. I suggested we proceed with sleep study testing. His insurance denied an attended sleep study and he had a home sleep test on 04/02/2017 indicating an AHI of 29.7, average oxygen saturation of 90%, nadir of 74%. Despite the severity of his sleep apnea his insurance denied a CPAP titration study and we started him on AutoPap therapy.  Today, 07/03/2017 (all dictated new, as well as above notes, some dictation done in note pad or Word, outside of chart, may appear as copied): I reviewed his AutoPap compliance data from 06/02/2017 through 07/01/2017, which is a total of 30 days, during which time he used his AutoPap every night with percent used days greater than 4 hours at 90%, indicating excellent compliance with an average usage of 5 hours and 6 minutes, residual AHI 2.2 per hour, 95th percentile of pressure at 11.4 cm, leak on the high side with the 95th percentile at 19.9 L/m on pressure range of 6 cm to 15 cm with EPR. He reports doing well with his AutoPap. He feels improved with respect to his sleep. Nocturia is better. Per wife, he no longer snores. Has changed his filter once.   The patient's allergies, current medications, family history, past medical history, past social history, past surgical history and problem list were reviewed and updated as appropriate.   Previously (copied from  previous notes for reference):   03/06/2017: (He) reports snoring and excessive daytime somnolence, as well as recent difficulty controlling blood pressure despite taking 3 medications.  I reviewed your office note from 01/22/2017. He had recent changes in his blood pressure medications and numbers are better, he monitors his blood pressure at home as well.  His Epworth sleepiness score is 8 out of 24, fatigue score is 3 out of 63. He is married and lives with his wife. He has one daughter in [place], his son lives in [place]. His son and daughter-in-law are expecting their first child, patient's daughter has 3 children. He is a nonsmoker, does not drink alcohol and drinks caffeine in the form of coffee, 2 cups per day on average and soda 2 cans per day on average. He has been known to snore for years. His wife is also witnessed apneic pauses while he is asleep. He denies restless leg symptoms. He does have nocturia, once or twice per night on average. He denies morning headaches. The time is around 11 PM, wakeup time at 5 AM. He denies sleep talking or other parasomnias.   His Past Medical History Is Significant For: Past Medical History:  Diagnosis Date  . Achilles tendinitis    PMH of  . B12 deficiency   . Barrett's esophagus   . Diverticulosis of colon (without mention of hemorrhage) 2010  . Fatty liver 10-16-2011   [place]  . GERD (gastroesophageal reflux disease)   . Gilbert syndrome   . Hiatal hernia   . 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  Hyperlipidemia   . Hypertension   . Nonspecific elevation of levels of transaminase or lactic acid dehydrogenase (LDH)    PMH of    His Past Surgical History Is Significant For: Past Surgical History:  Procedure Laterality Date  . COLONOSCOPY  2010   negative  . KNEE ARTHROSCOPY     R 2015 & L 2016; [facility]  . UPPER GASTROINTESTINAL ENDOSCOPY     Dr Sharlett Iles; multiple  snce 1992  . VASECTOMY    . WISDOM TOOTH EXTRACTION      His Family  History Is Significant For: Family History  Problem Relation Age of Onset  . Esophageal cancer Father   . Stomach cancer Father   . Hypertension Father   . Heart disease Father        CBAG @ 37  . Hypertension Mother   . Heart attack Mother 27  . Cancer Mother   . Hypertension Sister   . Hypertension Sister   . Hypertension Sister   . Stroke Neg Hx   . Diabetes Neg Hx   . Early death Neg Hx   . Hyperlipidemia Neg Hx   . Kidney disease Neg Hx   . Alcohol abuse Neg Hx     His Social History Is Significant For: Social History   Social History  . Marital status: Married    Spouse name: N/A  . Number of children: 2  . Years of education: 12   Occupational History  . dry cleaners    Social History Main Topics  . Smoking status: Never Smoker  . Smokeless tobacco: Never Used  . Alcohol use No  . Drug use: No  . Sexual activity: Yes   Other Topics Concern  . None   Social History Narrative   Caffeine: Drinks 2 sodas a day    His Allergies Are:  No Known Allergies:   His Current Medications Are:  Outpatient Encounter Prescriptions as of 07/03/2017  Medication Sig  . CARTIA XT 240 MG 24 hr capsule take 1 capsule by mouth once daily  . chlorthalidone (HYGROTON) 25 MG tablet Take 1 tablet (25 mg total) by mouth daily.  . Cyanocobalamin (VITAMIN B-12 IJ) Inject as directed. monthly  . esomeprazole (NEXIUM) 40 MG capsule take 1 capsule by mouth every morning BEFORE BREAKFAST  . fexofenadine (ALLEGRA) 180 MG tablet Take 180 mg by mouth daily.    Marland Kitchen losartan (COZAAR) 100 MG tablet Take 1 tablet (100 mg total) by mouth daily.  . meloxicam (MOBIC) 15 MG tablet Take 15 mg by mouth daily.  . potassium chloride SA (K-DUR,KLOR-CON) 20 MEQ tablet Take 1 tablet (20 mEq total) by mouth 2 (two) times daily after a meal.  . traMADol (ULTRAM) 50 MG tablet take 1 tablet by mouth once daily if needed for pain   Facility-Administered Encounter Medications as of 07/03/2017  Medication  .  cyanocobalamin ((VITAMIN B-12)) injection 1,000 mcg  :  Review of Systems:  Out of a complete 14 point review of systems, all are reviewed and negative with the exception of these symptoms as listed below: Review of Systems  Neurological:       Pt presents today to discuss his auto pap use. Pt says that he feels well while using his auto pap.   Objective:  Neurological Exam  Physical Exam Physical Examination:   Vitals:   07/03/17 1506  BP: 138/90  Pulse: 83   General Examination: The patient is a very pleasant 61 y.o. male in  no acute distress. He appears well-developed and well-nourished and well groomed.   HEENT: Normocephalic, atraumatic, pupils are equal, round and reactive to light and accommodation. Corrective eye glasses. Extraocular tracking is good without limitation to gaze excursion or nystagmus noted. Normal smooth pursuit is noted. Hearing is grossly intact. Face is symmetric with normal facial animation and normal facial sensation. Speech is clear with no dysarthria noted. There is no hypophonia. There is no lip, neck/head, jaw or voice tremor. Neck is supple with full range of passive and active motion. There are no carotid bruits on auscultation. Oropharynx exam reveals: mild mouth dryness, adequate dental hygiene and mild airway crowding, due to redundant soft palate and mildly larger appearing uvula. Mallampati is class II. Tongue protrudes centrally and palate elevates symmetrically. Tonsils are small.   Chest: Clear to auscultation without wheezing, rhonchi or crackles noted.  Heart: S1+S2+0, regular and normal without murmurs, rubs or gallops noted.   Abdomen: Soft, non-tender and non-distended with normal bowel sounds appreciated on auscultation.  Extremities: There is no pitting edema in the distal lower extremities bilaterally. Pedal pulses are intact.  Skin: Warm and dry without trophic changes noted.  Musculoskeletal: exam reveals no obvious joint  deformities, tenderness or joint swelling or erythema.   Neurologically:  Mental status: The patient is awake, alert and oriented in all 4 spheres. His immediate and remote memory, attention, language skills and fund of knowledge are appropriate. There is no evidence of aphasia, agnosia, apraxia or anomia. Speech is clear with normal prosody and enunciation. Thought process is linear. Mood is normal and affect is normal.  Cranial nerves II - XII are as described above under HEENT exam.  Motor exam: Normal bulk, strength and tone is noted. There is no drift, tremor or rebound. Romberg is negative. Reflexes are 1+ throughout. Fine motor skills and coordination: grossly intact.  Cerebellar testing: No dysmetria or intention tremor. There is no truncal or gait ataxia.  Sensory exam: intact to light touch in the upper and lower extremities.  Gait, station and balance: He stands easily. No veering to one side is noted. No leaning to one side is noted. Posture is age-appropriate and stance is narrow based. Gait shows normal stride length and normal pace. No problems with turns. Tandem walk is unremarkable.       Assessment and Plan:   In summary, Alex [name] is a very pleasant 61 year old male with an underlying medical history of hypertension, hyperlipidemia, vitamin B-12 deficiency, reflux disease, and obesity, who presents for follow-up consultation of his obstructive sleep apnea, after his home sleep test in June indicated severe obstructive sleep apnea with an AHI of 29.7 per hour, O2 nadir of 74% with time below 88% saturation of 93 minutes for the night. He has since then establish AutoPap therapy as his insurance did not approve him for a titration study, nor a baseline sleep study. He has been compliant with AutoPap therapy at indicates good results including resolution of his snoring, improvement of nocturia, better sleep consolidation sleep quality. He is motivated to continue with  treatment and commended for his treatment adherence. Physical exam is stable. I hoping that we will also see with time improvement in his blood pressure values. Since he did have severe desaturations to as noted in the home sleep test and we do not have documentation of his oxygen saturations on treatment I would like to proceed with an overnight pulse oximetry test while he is using his AutoPap 1 night.  We will fax the order to his current DME company and call him with his results. I suggested a six-month follow-up and we will call him in the interim with his pulse oximetry test results to update him. He can see one of our nurse practitioners next time. I answered all his questions today and he was in agreement.

## 2017-07-07 ENCOUNTER — Other Ambulatory Visit: Payer: Self-pay | Admitting: Internal Medicine

## 2017-07-07 DIAGNOSIS — I10 Essential (primary) hypertension: Secondary | ICD-10-CM

## 2017-07-08 ENCOUNTER — Other Ambulatory Visit: Payer: Self-pay | Admitting: Internal Medicine

## 2017-07-08 DIAGNOSIS — G4733 Obstructive sleep apnea (adult) (pediatric): Secondary | ICD-10-CM | POA: Diagnosis not present

## 2017-07-15 ENCOUNTER — Encounter: Payer: Self-pay | Admitting: Neurology

## 2017-07-15 DIAGNOSIS — R0902 Hypoxemia: Secondary | ICD-10-CM | POA: Diagnosis not present

## 2017-07-15 DIAGNOSIS — J449 Chronic obstructive pulmonary disease, unspecified: Secondary | ICD-10-CM | POA: Diagnosis not present

## 2017-07-22 ENCOUNTER — Telehealth: Payer: Self-pay | Admitting: Neurology

## 2017-07-22 NOTE — Telephone Encounter (Signed)
I reviewed the patient's ONO (overnight pulse oximetry report) from 07/15/2017, at which time patient was on room air using his AutoPap, total valid testing time was 6 hours and 42 minutes, average oxygen saturation 92%, nadir was 88%. As it stands, the patient is well treated on his current AutoPap machine and does not have any significant desaturations which is very good news. Please encourage patient to continue using his AutoPap machine as well as he has, thankfully he does not require additional testing or oxygen supplementation at this time. Please encourage him to keep his follow-up appointment in March with Megan.

## 2017-07-23 NOTE — Telephone Encounter (Signed)
I called pt. I advised him that Dr. Rexene Alberts reviewed his ONO results and found that pt is well treated on his auto pap, no significant desaturations noted, no additional testing or oxygen supplementation is required at this time. I encouraged pt to continue using auto pap and to call us with questions or concerns, and reminded him of his follow up appt in March. Pt verbalized understanding of results. Pt had no questions at this time but was encouraged to call back if questions arise.

## 2017-08-07 DIAGNOSIS — G4733 Obstructive sleep apnea (adult) (pediatric): Secondary | ICD-10-CM | POA: Diagnosis not present

## 2017-08-13 ENCOUNTER — Ambulatory Visit (INDEPENDENT_AMBULATORY_CARE_PROVIDER_SITE_OTHER): Payer: BLUE CROSS/BLUE SHIELD

## 2017-08-13 DIAGNOSIS — E538 Deficiency of other specified B group vitamins: Secondary | ICD-10-CM | POA: Diagnosis not present

## 2017-08-13 MED ORDER — CYANOCOBALAMIN 1000 MCG/ML IJ SOLN
1000.0000 ug | Freq: Once | INTRAMUSCULAR | Status: AC
  Administered 2017-08-13: 1000 ug via INTRAMUSCULAR

## 2017-08-18 ENCOUNTER — Other Ambulatory Visit: Payer: Self-pay | Admitting: Internal Medicine

## 2017-08-18 DIAGNOSIS — I10 Essential (primary) hypertension: Secondary | ICD-10-CM

## 2017-08-20 ENCOUNTER — Other Ambulatory Visit: Payer: Self-pay | Admitting: Internal Medicine

## 2017-08-21 DIAGNOSIS — M1711 Unilateral primary osteoarthritis, right knee: Secondary | ICD-10-CM | POA: Diagnosis not present

## 2017-08-21 DIAGNOSIS — M25562 Pain in left knee: Secondary | ICD-10-CM | POA: Diagnosis not present

## 2017-08-21 DIAGNOSIS — M25561 Pain in right knee: Secondary | ICD-10-CM | POA: Diagnosis not present

## 2017-08-21 DIAGNOSIS — M170 Bilateral primary osteoarthritis of knee: Secondary | ICD-10-CM | POA: Diagnosis not present

## 2017-08-21 DIAGNOSIS — M1712 Unilateral primary osteoarthritis, left knee: Secondary | ICD-10-CM | POA: Diagnosis not present

## 2017-09-07 DIAGNOSIS — G4733 Obstructive sleep apnea (adult) (pediatric): Secondary | ICD-10-CM | POA: Diagnosis not present

## 2017-09-22 ENCOUNTER — Other Ambulatory Visit: Payer: Self-pay | Admitting: Internal Medicine

## 2017-09-22 DIAGNOSIS — E876 Hypokalemia: Secondary | ICD-10-CM

## 2017-09-22 DIAGNOSIS — I10 Essential (primary) hypertension: Secondary | ICD-10-CM

## 2017-09-22 MED ORDER — POTASSIUM CHLORIDE CRYS ER 20 MEQ PO TBCR: 20.0000 meq | EXTENDED_RELEASE_TABLET | Freq: Two times a day (BID) | ORAL | 0 refills | Status: DC

## 2017-09-24 ENCOUNTER — Ambulatory Visit (INDEPENDENT_AMBULATORY_CARE_PROVIDER_SITE_OTHER): Payer: BLUE CROSS/BLUE SHIELD

## 2017-09-24 DIAGNOSIS — E538 Deficiency of other specified B group vitamins: Secondary | ICD-10-CM | POA: Diagnosis not present

## 2017-09-24 MED ORDER — CYANOCOBALAMIN 1000 MCG/ML IJ SOLN
1000.0000 ug | Freq: Once | INTRAMUSCULAR | Status: AC
  Administered 2017-09-24: 1000 ug via INTRAMUSCULAR

## 2017-09-25 DIAGNOSIS — H524 Presbyopia: Secondary | ICD-10-CM | POA: Diagnosis not present

## 2017-09-29 ENCOUNTER — Telehealth: Payer: Self-pay | Admitting: Internal Medicine

## 2017-09-29 MED ORDER — ESOMEPRAZOLE MAGNESIUM 40 MG PO CPDR: DELAYED_RELEASE_CAPSULE | ORAL | 1 refills | Status: DC

## 2017-09-29 NOTE — Telephone Encounter (Signed)
Generic Nexium refilled

## 2017-10-07 DIAGNOSIS — G4733 Obstructive sleep apnea (adult) (pediatric): Secondary | ICD-10-CM | POA: Diagnosis not present

## 2017-10-14 ENCOUNTER — Other Ambulatory Visit: Payer: Self-pay | Admitting: Internal Medicine

## 2017-10-14 DIAGNOSIS — I10 Essential (primary) hypertension: Secondary | ICD-10-CM

## 2017-10-14 MED ORDER — CHLORTHALIDONE 25 MG PO TABS: 25.0000 mg | ORAL_TABLET | Freq: Every day | ORAL | 0 refills | Status: DC

## 2017-10-15 DIAGNOSIS — M25562 Pain in left knee: Secondary | ICD-10-CM | POA: Diagnosis not present

## 2017-10-15 DIAGNOSIS — M1712 Unilateral primary osteoarthritis, left knee: Secondary | ICD-10-CM | POA: Diagnosis not present

## 2017-10-15 DIAGNOSIS — G8929 Other chronic pain: Secondary | ICD-10-CM | POA: Diagnosis not present

## 2017-10-15 DIAGNOSIS — M1711 Unilateral primary osteoarthritis, right knee: Secondary | ICD-10-CM | POA: Diagnosis not present

## 2017-10-30 ENCOUNTER — Ambulatory Visit (INDEPENDENT_AMBULATORY_CARE_PROVIDER_SITE_OTHER): Payer: BLUE CROSS/BLUE SHIELD | Admitting: *Deleted

## 2017-10-30 DIAGNOSIS — E538 Deficiency of other specified B group vitamins: Secondary | ICD-10-CM | POA: Diagnosis not present

## 2017-10-30 MED ORDER — CYANOCOBALAMIN 1000 MCG/ML IJ SOLN
1000.0000 ug | Freq: Once | INTRAMUSCULAR | Status: AC
  Administered 2017-10-30: 1000 ug via INTRAMUSCULAR

## 2017-11-07 DIAGNOSIS — G4733 Obstructive sleep apnea (adult) (pediatric): Secondary | ICD-10-CM | POA: Diagnosis not present

## 2017-11-17 ENCOUNTER — Telehealth: Payer: Self-pay

## 2017-11-17 DIAGNOSIS — I10 Essential (primary) hypertension: Secondary | ICD-10-CM

## 2017-11-17 MED ORDER — LOSARTAN POTASSIUM 100 MG PO TABS: 100.0000 mg | ORAL_TABLET | Freq: Every day | ORAL | 3 refills | Status: DC

## 2017-11-17 NOTE — Telephone Encounter (Signed)
erx sent

## 2017-11-17 NOTE — Telephone Encounter (Signed)
[facility] sent rx rq for losartan. Pt will need an appt before the refills can be sent in. LOV 02/2017

## 2017-11-17 NOTE — Telephone Encounter (Signed)
Called patient to schedule appointment. He went ahead and set up his physical for 01/28/18. Does he need to come in sooner to be able to get this refill or would you be able to send in enough to get him through until his physical?

## 2017-11-18 ENCOUNTER — Encounter: Payer: Self-pay | Admitting: Internal Medicine

## 2017-11-18 ENCOUNTER — Ambulatory Visit: Payer: BLUE CROSS/BLUE SHIELD | Admitting: Internal Medicine

## 2017-11-18 VITALS — BP 132/76 | HR 76 | Ht 70.0 in | Wt 217.4 lb

## 2017-11-18 DIAGNOSIS — K2270 Barrett's esophagus without dysplasia: Secondary | ICD-10-CM | POA: Diagnosis not present

## 2017-11-18 DIAGNOSIS — K219 Gastro-esophageal reflux disease without esophagitis: Secondary | ICD-10-CM

## 2017-11-18 DIAGNOSIS — K5730 Diverticulosis of large intestine without perforation or abscess without bleeding: Secondary | ICD-10-CM | POA: Diagnosis not present

## 2017-11-18 MED ORDER — ESOMEPRAZOLE MAGNESIUM 40 MG PO CPDR: DELAYED_RELEASE_CAPSULE | ORAL | 11 refills | Status: DC

## 2017-11-18 NOTE — Patient Instructions (Addendum)
If you are age 61 or older, your body mass index should be between 23-30. Your Body mass index is 31.19 kg/m. If this is out of the aforementioned range listed, please consider follow up with your Primary Care Provider.  If you are age 80 or younger, your body mass index should be between 19-25. Your Body mass index is 31.19 kg/m. If this is out of the aformentioned range listed, please consider follow up with your Primary Care Provider.   We have sent the following medications to your pharmacy for you to pick up at your convenience:  Nexium  Please follow up in 2 years with Dr. Henrene Pastor. You will be contacted by letter when it is time to schedule this.  Thank you.

## 2017-11-18 NOTE — Progress Notes (Signed)
HISTORY OF PRESENT ILLNESS:  Alex [name] is a 62 y.o. 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 He was last evaluated in this office April 2017. His last upper endoscopy was performed October 2015. He was found to have short segment nondysplastic Barrett's esophagus without other abnormalities. He has been on chronic PPI therapy. Currently on Nexium 40 mg daily. On medication he has no reflux symptoms. He denies dysphagia. His GI review of systems is otherwise negative. His colon cancer screening is up-to-date with previous colonoscopy April 2010, with Dr. Sharlett Iles, revealing left-sided diverticulosis without neoplasia. Review of outside laboratories from May 2018 shows normal renal function. GFR 72.6. Last CBC normal with hemoglobin 16.7. No new imaging. He is anticipating sequential bilateral knee surgery this spring  REVIEW OF SYSTEMS:  All non-GI ROS negative except for allergies, osteoarthritis  Past Medical History:  Diagnosis Date  . Achilles tendinitis    PMH of  . B12 deficiency   . Barrett's esophagus   . Diverticulosis of colon (without mention of hemorrhage) 2010  . Fatty liver 10-16-2011   [place]  . GERD (gastroesophageal reflux disease)   . Gilbert syndrome   . Hiatal hernia   . 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 Hyperlipidemia   . Hypertension   . Nonspecific elevation of levels of transaminase or lactic acid dehydrogenase (LDH)    PMH of    Past Surgical History:  Procedure Laterality Date  . COLONOSCOPY  2010   negative  . KNEE ARTHROSCOPY     R 2015 & L 2016; [facility]  . UPPER GASTROINTESTINAL ENDOSCOPY     Dr Sharlett Iles; multiple  snce 1992  . VASECTOMY    . WISDOM TOOTH EXTRACTION      Social History Alex [name]  reports that  has never smoked. he has never used smokeless tobacco. He reports that he does not drink alcohol or use drugs.  family history  includes Cancer in his mother; Esophageal cancer in his father; Heart attack (age of onset: 90) in his mother; Heart disease in his father; Hypertension in his father, mother, sister, sister, and sister; Stomach cancer in his father.  No Known Allergies     PHYSICAL EXAMINATION: Vital signs: BP 132/76   Pulse 76   Ht 5' 10"  (1.778 m)   Wt 217 lb 6 oz (98.6 kg)   BMI 31.19 kg/m   Constitutional: generally well-appearing, no acute distress Psychiatric: alert and oriented x3, cooperative Eyes: extraocular movements intact, anicteric, conjunctiva pink Mouth: oral pharynx moist, no lesions Neck: supple no lymphadenopathy Cardiovascular: heart regular rate and rhythm, no murmur Lungs: clear to auscultation bilaterally Abdomen: soft, nontender, nondistended, no obvious ascites, no peritoneal signs, normal bowel sounds, no organomegaly Rectal: Omitted clubbing, cyanosis, or Extremities: no lower extremity edema bilaterally Skin: no lesions on visible extremities Neuro: No focal deficits. Cranial nerves intact  ASSESSMENT:  #1. GERD complicated by short segment nondysplastic Barrett's esophagus. Asymptomatic on PPI #2. Colon cancer screening up-to-date. Diverticulosis on colonoscopy 2010   PLAN:  #1. Reflux precautions #2. Continue Nexium 40 mg daily. A prescription with multiple refills has been provided #3. Due for surveillance upper endoscopy with biopsies October 2020 #4. Due for routine repeat screening colonoscopy around April 2020 #5. Routine office follow-up 2 years   15 minutes spent face-to-face with the patient. Greater 50% a time use for counseling regarding management and monitoring of his chronic GERD as  well as issues associated with chronic PPI use

## 2017-12-03 ENCOUNTER — Ambulatory Visit (INDEPENDENT_AMBULATORY_CARE_PROVIDER_SITE_OTHER): Payer: BLUE CROSS/BLUE SHIELD

## 2017-12-03 DIAGNOSIS — E538 Deficiency of other specified B group vitamins: Secondary | ICD-10-CM

## 2017-12-03 MED ORDER — CYANOCOBALAMIN 1000 MCG/ML IJ SOLN
1000.0000 ug | Freq: Once | INTRAMUSCULAR | Status: AC
  Administered 2017-12-03: 1000 ug via INTRAMUSCULAR

## 2017-12-03 NOTE — Progress Notes (Signed)
b12 Injection given.   Stacy J Burns, MD  

## 2017-12-08 DIAGNOSIS — G4733 Obstructive sleep apnea (adult) (pediatric): Secondary | ICD-10-CM | POA: Diagnosis not present

## 2017-12-17 DIAGNOSIS — M1711 Unilateral primary osteoarthritis, right knee: Secondary | ICD-10-CM | POA: Diagnosis not present

## 2017-12-30 ENCOUNTER — Encounter: Payer: Self-pay | Admitting: Adult Health

## 2017-12-30 ENCOUNTER — Encounter: Payer: Self-pay | Admitting: Internal Medicine

## 2017-12-30 ENCOUNTER — Ambulatory Visit: Payer: BLUE CROSS/BLUE SHIELD | Admitting: Internal Medicine

## 2017-12-30 ENCOUNTER — Other Ambulatory Visit (INDEPENDENT_AMBULATORY_CARE_PROVIDER_SITE_OTHER): Payer: BLUE CROSS/BLUE SHIELD

## 2017-12-30 VITALS — BP 122/80 | HR 71 | Temp 97.9°F | Ht 70.0 in | Wt 214.0 lb

## 2017-12-30 DIAGNOSIS — R945 Abnormal results of liver function studies: Secondary | ICD-10-CM | POA: Diagnosis not present

## 2017-12-30 DIAGNOSIS — E876 Hypokalemia: Secondary | ICD-10-CM

## 2017-12-30 DIAGNOSIS — E538 Deficiency of other specified B group vitamins: Secondary | ICD-10-CM | POA: Diagnosis not present

## 2017-12-30 DIAGNOSIS — I10 Essential (primary) hypertension: Secondary | ICD-10-CM

## 2017-12-30 DIAGNOSIS — R7989 Other specified abnormal findings of blood chemistry: Secondary | ICD-10-CM

## 2017-12-30 LAB — MAGNESIUM: Magnesium: 1.9 mg/dL (ref 1.5–2.5)

## 2017-12-30 LAB — CBC WITH DIFFERENTIAL/PLATELET
Basophils Absolute: 0.1 10*3/uL (ref 0.0–0.1)
Basophils Relative: 1.4 % (ref 0.0–3.0)
Eosinophils Absolute: 0.1 10*3/uL (ref 0.0–0.7)
Eosinophils Relative: 1.5 % (ref 0.0–5.0)
HCT: 46.3 % (ref 39.0–52.0)
Hemoglobin: 16.2 g/dL (ref 13.0–17.0)
Lymphocytes Relative: 30.9 % (ref 12.0–46.0)
Lymphs Abs: 1.5 10*3/uL (ref 0.7–4.0)
MCHC: 34.9 g/dL (ref 30.0–36.0)
MCV: 94 fl (ref 78.0–100.0)
Monocytes Absolute: 0.5 10*3/uL (ref 0.1–1.0)
Monocytes Relative: 10.3 % (ref 3.0–12.0)
Neutro Abs: 2.8 10*3/uL (ref 1.4–7.7)
Neutrophils Relative %: 55.9 % (ref 43.0–77.0)
Platelets: 190 10*3/uL (ref 150.0–400.0)
RBC: 4.93 Mil/uL (ref 4.22–5.81)
RDW: 12.6 % (ref 11.5–15.5)
WBC: 5 10*3/uL (ref 4.0–10.5)

## 2017-12-30 LAB — BASIC METABOLIC PANEL
BUN: 29 mg/dL — ABNORMAL HIGH (ref 6–23)
CO2: 31 mEq/L (ref 19–32)
Calcium: 9.6 mg/dL (ref 8.4–10.5)
Chloride: 100 mEq/L (ref 96–112)
Creatinine, Ser: 0.99 mg/dL (ref 0.40–1.50)
GFR: 81.79 mL/min (ref >60.00)
Glucose, Bld: 116 mg/dL — ABNORMAL HIGH (ref 70–99)
Potassium: 3.4 mEq/L — ABNORMAL LOW (ref 3.5–5.1)
Sodium: 139 mEq/L (ref 135–145)

## 2017-12-30 LAB — HEPATIC FUNCTION PANEL
ALT: 42 U/L (ref 0–53)
AST: 26 U/L (ref 0–37)
Albumin: 4.3 g/dL (ref 3.5–5.2)
Alkaline Phosphatase: 42 U/L (ref 39–117)
Bilirubin, Direct: 0.2 mg/dL (ref 0.0–0.3)
Total Bilirubin: 0.7 mg/dL (ref 0.2–1.2)
Total Protein: 7 g/dL (ref 6.0–8.3)

## 2017-12-30 NOTE — Progress Notes (Signed)
Subjective:  Patient ID: Alex [name], male    DOB: [birth year]  Age: 62 y.o. MRN: [id number]  CC: Hypertension   HPI Alex [name] presents for a BP check - he is having orthopedic surgery on his knees in the next month or 2 and needs preoperative clearance.  He tells me he walks for about 10 hours a day and does not experience DOE, CP, palpitations, edema, or fatigue.  He tells me his blood pressure has been well controlled.  Outpatient Medications Prior to Visit  Medication Sig Dispense Refill  . chlorthalidone (HYGROTON) 25 MG tablet Take 1 tablet (25 mg total) by mouth daily. 90 tablet 0  . Cyanocobalamin (VITAMIN B-12 IJ) Inject as directed. monthly    . diltiazem (CARDIZEM CD) 240 MG 24 hr capsule take 1 capsule by mouth once daily 30 capsule 5  . esomeprazole (NEXIUM) 40 MG capsule take 1 capsule by mouth every morning BEFORE BREAKFAST 30 capsule 11  . fexofenadine (ALLEGRA) 180 MG tablet Take 180 mg by mouth daily.      Marland Kitchen losartan (COZAAR) 100 MG tablet Take 1 tablet (100 mg total) by mouth daily. 30 tablet 3  . meloxicam (MOBIC) 15 MG tablet Take 15 mg by mouth daily.    . traMADol (ULTRAM) 50 MG tablet take 1 tablet by mouth once daily if needed for pain  0  . potassium chloride SA (K-DUR,KLOR-CON) 20 MEQ tablet Take 1 tablet (20 mEq total) by mouth 2 (two) times daily after a meal. 180 tablet 0   Facility-Administered Medications Prior to Visit  Medication Dose Route Frequency Provider Last Rate Last Dose  . cyanocobalamin ((VITAMIN B-12)) injection 1,000 mcg  1,000 mcg Intramuscular Q30 days Hendricks Limes, MD   1,000 mcg at 03/19/17 1437    ROS Review of Systems  Constitutional: Negative.  Negative for activity change, diaphoresis, fatigue and unexpected weight change.  HENT: Negative.  Negative for trouble swallowing.   Eyes: Negative for visual disturbance.  Respiratory: Negative for cough, chest tightness, shortness of breath and wheezing.     Cardiovascular: Negative for chest pain, palpitations and leg swelling.  Gastrointestinal: Negative for abdominal pain, diarrhea, nausea and vomiting.  Endocrine: Negative.   Genitourinary: Negative.  Negative for difficulty urinating and dysuria.  Musculoskeletal: Positive for arthralgias. Negative for myalgias.  Skin: Negative.  Negative for color change and pallor.  Allergic/Immunologic: Negative.   Neurological: Negative.  Negative for dizziness, weakness and light-headedness.  Hematological: Negative for adenopathy. Does not bruise/bleed easily.  Psychiatric/Behavioral: Negative.     Objective:  BP 122/80 (BP Location: Left Arm, Patient Position: Sitting, Cuff Size: Large)   Pulse 71   Temp 97.9 F (36.6 C) (Oral)   Ht 5' 10"  (1.778 m)   Wt 214 lb (97.1 kg)   SpO2 96%   BMI 30.71 kg/m   BP Readings from Last 3 Encounters:  12/30/17 122/80  11/18/17 132/76  07/03/17 138/90    Wt Readings from Last 3 Encounters:  12/30/17 214 lb (97.1 kg)  11/18/17 217 lb 6 oz (98.6 kg)  07/03/17 217 lb (98.4 kg)    Physical Exam  Constitutional: He is oriented to person, place, and time. No distress.  HENT:  Mouth/Throat: Oropharynx is clear and moist. No oropharyngeal exudate.  Eyes: Conjunctivae are normal. Left eye exhibits no discharge. No scleral icterus.  Neck: Normal range of motion. Neck supple. No JVD present. No thyromegaly present.  Cardiovascular: Normal rate, regular rhythm and normal heart  sounds. Exam reveals no gallop.  No murmur heard. 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   ABNORMAL there is voltage loss in 3 and aVF but this is unchanged compared to the prior EKG of one year ago.   Pulmonary/Chest: Effort normal and breath sounds normal. No respiratory distress. He has no wheezes. He has no rales.  Abdominal: Soft. Bowel sounds are normal. He exhibits no distension and no mass. There is no tenderness. There is no guarding.  Musculoskeletal: Normal  range of motion. He exhibits no edema, tenderness or deformity.  Lymphadenopathy:    He has no cervical adenopathy.  Neurological: He is alert and oriented to person, place, and time.  Skin: Skin is warm and dry. No rash noted. He is not diaphoretic. No erythema. No pallor.  Vitals reviewed.   Lab Results  Component Value Date   WBC 5.0 12/30/2017   HGB 16.2 12/30/2017   HCT 46.3 12/30/2017   PLT 190.0 12/30/2017   GLUCOSE 116 (H) 12/30/2017   CHOL 173 01/22/2017   TRIG 96.0 01/22/2017   HDL 41.90 01/22/2017   LDLDIRECT 146.5 12/05/2010   LDLCALC 111 (H) 01/22/2017   ALT 42 12/30/2017   AST 26 12/30/2017   NA 139 12/30/2017   K 3.4 (L) 12/30/2017   CL 100 12/30/2017   CREATININE 0.99 12/30/2017   BUN 29 (H) 12/30/2017   CO2 31 12/30/2017   TSH 1.88 01/22/2017   PSA 1.15 01/22/2017   HGBA1C 5.6 07/24/2016    Dg Finger Ring Right  Result Date: 08/13/2013 CLINICAL DATA:  Injury. EXAM: RIGHT RING FINGER 2+V COMPARISON:  None. FINDINGS: Soft tissue injury right 4th finger distal phalanx level without underlying fracture or dislocation. IMPRESSION: Soft tissue injury right 4th finger distal phalanx level without underlying fracture or dislocation. Electronically Signed   By: Chauncey Cruel M.D.   On: 08/13/2013 12:50    Assessment & Plan:   Henok was seen today for hypertension.  Diagnoses and all orders for this visit:  Essential hypertension- His BP is well controlled.  He has mild hypokalemia.  His EKG is negative for LVH or ischemia.  From my standpoint he is cleared for surgery. -     EKG 12-Lead -     CBC with Differential/Platelet; Future -     Basic metabolic panel; Future -     Magnesium; Future -     potassium chloride SA (K-DUR,KLOR-CON) 20 MEQ tablet; Take 1 tablet (20 mEq total) by mouth 3 (three) times daily.  Hypokalemia- His K+ level is 3.4.  I have increased the dose of his potassium supplement. -     Basic metabolic panel; Future -     Magnesium;  Future -     potassium chloride SA (K-DUR,KLOR-CON) 20 MEQ tablet; Take 1 tablet (20 mEq total) by mouth 3 (three) times daily.  B12 deficiency- Will continue B12 replacement therapy. -     CBC with Differential/Platelet; Future  LFT elevation- His LFTs are normal now.  This was likely related to fatty liver disease. -     Hepatic function panel; Future   I have changed Alex [name]. Alex [name] potassium chloride SA. I am also having him maintain his fexofenadine, meloxicam, Cyanocobalamin (VITAMIN B-12 IJ), traMADol, diltiazem, chlorthalidone, losartan, and esomeprazole. We will continue to administer cyanocobalamin.  Meds ordered this encounter  Medications  . potassium chloride SA (K-DUR,KLOR-CON) 20 MEQ tablet    Sig: Take 1 tablet (20 mEq total) by mouth  3 (three) times daily.    Dispense:  270 tablet    Refill:  1     Follow-up: Return in about 6 months (around 07/02/2018).  Scarlette Calico, MD

## 2017-12-30 NOTE — Patient Instructions (Signed)

## 2017-12-31 ENCOUNTER — Encounter: Payer: Self-pay | Admitting: Internal Medicine

## 2017-12-31 ENCOUNTER — Ambulatory Visit: Payer: BLUE CROSS/BLUE SHIELD | Admitting: Adult Health

## 2017-12-31 ENCOUNTER — Encounter: Payer: Self-pay | Admitting: Adult Health

## 2017-12-31 VITALS — BP 125/69 | HR 76 | Wt 215.2 lb

## 2017-12-31 DIAGNOSIS — G4733 Obstructive sleep apnea (adult) (pediatric): Secondary | ICD-10-CM | POA: Diagnosis not present

## 2017-12-31 DIAGNOSIS — M1711 Unilateral primary osteoarthritis, right knee: Secondary | ICD-10-CM | POA: Diagnosis not present

## 2017-12-31 DIAGNOSIS — Z9989 Dependence on other enabling machines and devices: Secondary | ICD-10-CM

## 2017-12-31 MED ORDER — POTASSIUM CHLORIDE CRYS ER 20 MEQ PO TBCR: 20.0000 meq | EXTENDED_RELEASE_TABLET | Freq: Three times a day (TID) | ORAL | 1 refills | Status: DC

## 2017-12-31 NOTE — Patient Instructions (Signed)
Your Plan:  Continue using CPAP nightly If mask continues to cause you trouble let us know. If your symptoms worsen or you develop new symptoms please let us know.    Thank you for coming to see [place] at [facility]. I hope we have been able to provide you high quality care today.  You may receive a patient satisfaction survey over the next few weeks. We would appreciate your feedback and comments so that we may continue to improve ourselves and the health of our patients.

## 2017-12-31 NOTE — Progress Notes (Signed)
PATIENT: Alex [name] NO DOB: 01-27-1956  REASON FOR VISIT: follow up HISTORY FROM: patient  HISTORY OF PRESENT ILLNESS: Today 12/31/17 Alex [name] is a 62 year old male with a history of obstructive sleep apnea on CPAP.  He returns today for follow-up.  His CPAP download indicates that he uses machine 60 out of 60 days for compliance of 100%.  He uses machine greater than 4 hours 35 out of 60 days for compliance of 58%.  On average he uses his machine 4 hours and 17 minutes.  His residual AHI is 1.6 on a minimum pressure of 6 cm of water and maximum pressure of 15 cm of water with EPR of 3.  His leak in the 95th percentile is 20.8 L/min.  The patient reports that the mask sometimes slipped off his face and it has to be adjusted.  He also reports that there are times that he wakes up to go to the bathroom and he will not put the mask back on when he lays down.  The patient reports that before he has seen some benefit.  He returns today for follow-up.  HISTORY  07/03/2017:I reviewed his AutoPap compliance data from 06/02/2017 through 07/01/2017, which is a total of 30 days, during which time he used his AutoPap every night with percent used days greater than 4 hours at 90%, indicating excellent compliance with an average usage of 5 hours and 6 minutes, residual AHI 2.2 per hour, 95th percentile of pressure at 11.4 cm, leak on the high side with the 95th percentile at 19.9 L/m on pressure range of 6 cm to 15 cm with EPR. He reports doing well with his AutoPap. He feels improved with respect to his sleep. Nocturia is better. Per wife, he no longer snores. Has changed his filter once.   The patient's allergies, current medications, family history, past medical history, past social history, past surgical history and problem list were reviewed and updated as appropriate.   REVIEW OF SYSTEMS: Out of a complete 14 system review of symptoms, the patient complains only of the following symptoms, and  all other reviewed systems are negative.  Epworth sleepiness score 5   ALLERGIES: No Known Allergies  HOME MEDICATIONS: Outpatient Medications Prior to Visit  Medication Sig Dispense Refill  . chlorthalidone (HYGROTON) 25 MG tablet Take 1 tablet (25 mg total) by mouth daily. 90 tablet 0  . Cyanocobalamin (VITAMIN B-12 IJ) Inject as directed. monthly    . diltiazem (CARDIZEM CD) 240 MG 24 hr capsule take 1 capsule by mouth once daily 30 capsule 5  . esomeprazole (NEXIUM) 40 MG capsule take 1 capsule by mouth every morning BEFORE BREAKFAST 30 capsule 11  . fexofenadine (ALLEGRA) 180 MG tablet Take 180 mg by mouth daily.      Marland Kitchen losartan (COZAAR) 100 MG tablet Take 1 tablet (100 mg total) by mouth daily. 30 tablet 3  . meloxicam (MOBIC) 15 MG tablet Take 15 mg by mouth daily.    . potassium chloride SA (K-DUR,KLOR-CON) 20 MEQ tablet Take 1 tablet (20 mEq total) by mouth 3 (three) times daily. 270 tablet 1  . traMADol (ULTRAM) 50 MG tablet take 1 tablet by mouth once daily if needed for pain  0   Facility-Administered Medications Prior to Visit  Medication Dose Route Frequency Provider Last Rate Last Dose  . cyanocobalamin ((VITAMIN B-12)) injection 1,000 mcg  1,000 mcg Intramuscular Q30 days Hendricks Limes, MD   1,000 mcg at 03/19/17 1437  PAST MEDICAL HISTORY: Past Medical History:  Diagnosis Date  . Achilles tendinitis    PMH of  . B12 deficiency   . Barrett's esophagus   . Diverticulosis of colon (without mention of hemorrhage) 2010  . Fatty liver 10-16-2011   [place]  . GERD (gastroesophageal reflux disease)   . Gilbert syndrome   . Hiatal hernia   . 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 Hyperlipidemia   . Hypertension   . Nonspecific elevation of levels of transaminase or lactic acid dehydrogenase (LDH)    PMH of    PAST SURGICAL HISTORY: Past Surgical History:  Procedure Laterality Date  . COLONOSCOPY  2010   negative  . KNEE ARTHROSCOPY     R 2015 & L 2016; [facility]  .  UPPER GASTROINTESTINAL ENDOSCOPY     Dr Sharlett Iles; multiple  snce 1992  . VASECTOMY    . WISDOM TOOTH EXTRACTION      FAMILY HISTORY: Family History  Problem Relation Age of Onset  . Esophageal cancer Father   . Stomach cancer Father   . Hypertension Father   . Heart disease Father        CBAG @ 80  . Hypertension Mother   . Heart attack Mother 35  . Cancer Mother   . Hypertension Sister   . Hypertension Sister   . Hypertension Sister   . Stroke Neg Hx   . Diabetes Neg Hx   . Early death Neg Hx   . Hyperlipidemia Neg Hx   . Kidney disease Neg Hx   . Alcohol abuse Neg Hx     SOCIAL HISTORY: Social History   Socioeconomic History  . Marital status: Married    Spouse name: Not on file  . Number of children: 2  . Years of education: 73  . Highest education level: Not on file  Social Needs  . Financial resource strain: Not on file  . Food insecurity - worry: Not on file  . Food insecurity - inability: Not on file  . Transportation needs - medical: Not on file  . Transportation needs - non-medical: Not on file  Occupational History  . Occupation: dry cleaners  Tobacco Use  . Smoking status: Never Smoker  . Smokeless tobacco: Never Used  Substance and Sexual Activity  . Alcohol use: No  . Drug use: No  . Sexual activity: Yes  Other Topics Concern  . Not on file  Social History Narrative   Caffeine: Drinks 2 sodas a day      PHYSICAL EXAM  Vitals:   12/31/17 1449  BP: 125/69  Pulse: 76  Weight: 215 lb 3.2 oz (97.6 kg)   Body mass index is 30.88 kg/m.  Generalized: Well developed, in no acute distress   Neurological examination  Mentation: Alert oriented to time, place, history taking. Follows all commands speech and language fluent Cranial nerve II-XII: Pupils were equal round reactive to light. Extraocular movements were full, visual field were full on confrontational test. Facial sensation and strength were normal. Uvula tongue midline. Head  turning and shoulder shrug  were normal and symmetric.  Neck circumference 18 inches Motor: The motor testing reveals 5 over 5 strength of all 4 extremities. Good symmetric motor tone is noted throughout.  Sensory: Sensory testing is intact to soft touch on all 4 extremities. No evidence of extinction is noted.  Coordination: Cerebellar testing reveals good finger-nose-finger and heel-to-shin bilaterally.  Gait and station: Gait is normal. Tandem gait is normal. Romberg is negative.  No drift is seen.  Reflexes: Deep tendon reflexes are symmetric and normal bilaterally.   DIAGNOSTIC DATA (LABS, IMAGING, TESTING) - I reviewed patient records, labs, notes, testing and imaging myself where available.  Lab Results  Component Value Date   WBC 5.0 12/30/2017   HGB 16.2 12/30/2017   HCT 46.3 12/30/2017   MCV 94.0 12/30/2017   PLT 190.0 12/30/2017      Component Value Date/Time   NA 139 12/30/2017 1613   K 3.4 (L) 12/30/2017 1613   CL 100 12/30/2017 1613   CO2 31 12/30/2017 1613   GLUCOSE 116 (H) 12/30/2017 1613   BUN 29 (H) 12/30/2017 1613   CREATININE 0.99 12/30/2017 1613   CALCIUM 9.6 12/30/2017 1613   PROT 7.0 12/30/2017 1613   ALBUMIN 4.3 12/30/2017 1613   AST 26 12/30/2017 1613   ALT 42 12/30/2017 1613   ALKPHOS 42 12/30/2017 1613   BILITOT 0.7 12/30/2017 1613   GFRNONAA 83.24 11/15/2009 0939   GFRAA 114 11/16/2008 0000   Lab Results  Component Value Date   CHOL 173 01/22/2017   HDL 41.90 01/22/2017   LDLCALC 111 (H) 01/22/2017   LDLDIRECT 146.5 12/05/2010   TRIG 96.0 01/22/2017   CHOLHDL 4 01/22/2017   Lab Results  Component Value Date   HGBA1C 5.6 07/24/2016   Lab Results  Component Value Date   VITAMINB12 398 01/17/2016   Lab Results  Component Value Date   TSH 1.88 01/22/2017      ASSESSMENT AND PLAN 62 y.o. 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 here with :  1.  Obstructive sleep apnea on CPAP  The patient CPAP download shows suboptimal compliance.  The patient was encouraged to use the machine greater than 4 hours every night.  We discussed the risks associated with untreated sleep apnea.  He voiced understanding.  We also discussed potentially trying a new style of mask.  However at this time the patient deferred.  He states that he would like to continue using his current mask and see if he can get more comfortable with it.  Advised that if his symptoms worsen or he develops new symptoms he should let us know.  He will follow-up in 6 months or sooner if needed.   I spent 15 minutes with the patient. 50% of this time was spent   Alex Givens, MSN, NP-C 12/31/2017, 3:01 PM [facility] [address] [place], [place] [address] [phone]

## 2018-01-01 ENCOUNTER — Ambulatory Visit (INDEPENDENT_AMBULATORY_CARE_PROVIDER_SITE_OTHER): Payer: BLUE CROSS/BLUE SHIELD | Admitting: *Deleted

## 2018-01-01 DIAGNOSIS — E538 Deficiency of other specified B group vitamins: Secondary | ICD-10-CM

## 2018-01-01 MED ORDER — CYANOCOBALAMIN 1000 MCG/ML IJ SOLN
1000.0000 ug | Freq: Once | INTRAMUSCULAR | Status: AC
  Administered 2018-01-01: 1000 ug via INTRAMUSCULAR

## 2018-01-02 NOTE — Progress Notes (Signed)
I agree with the assessment and plan as directed by NP .The patient is known to me .   Avanelle Pixley, MD  

## 2018-01-05 DIAGNOSIS — G4733 Obstructive sleep apnea (adult) (pediatric): Secondary | ICD-10-CM | POA: Diagnosis not present

## 2018-01-12 MED ORDER — CHLORTHALIDONE 25 MG PO TABS: 25.0000 mg | ORAL_TABLET | Freq: Every day | ORAL | 1 refills | Status: DC

## 2018-01-12 NOTE — Addendum Note (Signed)
Addended by: Aviva Signs M on: 01/12/2018 12:23 PM   Modules accepted: Orders

## 2018-01-15 DIAGNOSIS — M1711 Unilateral primary osteoarthritis, right knee: Secondary | ICD-10-CM | POA: Diagnosis not present

## 2018-01-15 DIAGNOSIS — Z96651 Presence of right artificial knee joint: Secondary | ICD-10-CM | POA: Diagnosis not present

## 2018-01-15 HISTORY — PX: REPLACEMENT TOTAL KNEE: SUR1224

## 2018-01-19 DIAGNOSIS — M1711 Unilateral primary osteoarthritis, right knee: Secondary | ICD-10-CM | POA: Diagnosis not present

## 2018-01-22 DIAGNOSIS — M1711 Unilateral primary osteoarthritis, right knee: Secondary | ICD-10-CM | POA: Diagnosis not present

## 2018-01-26 ENCOUNTER — Encounter: Payer: Self-pay | Admitting: Internal Medicine

## 2018-01-26 ENCOUNTER — Encounter (HOSPITAL_COMMUNITY): Payer: Self-pay | Admitting: *Deleted

## 2018-01-26 ENCOUNTER — Emergency Department (HOSPITAL_COMMUNITY)
Admission: EM | Admit: 2018-01-26 | Discharge: 2018-01-26 | Disposition: A | Discharge status: Home / Self Care | Payer: BLUE CROSS/BLUE SHIELD | Attending: Emergency Medicine | Admitting: Emergency Medicine

## 2018-01-26 ENCOUNTER — Ambulatory Visit: Payer: BLUE CROSS/BLUE SHIELD | Admitting: Internal Medicine

## 2018-01-26 ENCOUNTER — Ambulatory Visit: Payer: Self-pay | Admitting: *Deleted

## 2018-01-26 VITALS — BP 138/80 | HR 116 | Temp 98.2°F | Ht 70.0 in | Wt 215.0 lb

## 2018-01-26 DIAGNOSIS — B0239 Other herpes zoster eye disease: Secondary | ICD-10-CM | POA: Diagnosis not present

## 2018-01-26 DIAGNOSIS — B0230 Zoster ocular disease, unspecified: Secondary | ICD-10-CM | POA: Diagnosis not present

## 2018-01-26 DIAGNOSIS — Z79899 Other long term (current) drug therapy: Secondary | ICD-10-CM | POA: Insufficient documentation

## 2018-01-26 DIAGNOSIS — I10 Essential (primary) hypertension: Secondary | ICD-10-CM | POA: Diagnosis not present

## 2018-01-26 DIAGNOSIS — R21 Rash and other nonspecific skin eruption: Secondary | ICD-10-CM | POA: Diagnosis not present

## 2018-01-26 MED ORDER — VALACYCLOVIR HCL 1 G PO TABS: 1000.0000 mg | ORAL_TABLET | Freq: Three times a day (TID) | ORAL | 0 refills | Status: AC

## 2018-01-26 MED ORDER — FLUORESCEIN SODIUM 1 MG OP STRP
1.0000 | ORAL_STRIP | Freq: Once | OPHTHALMIC | Status: AC
  Administered 2018-01-26: 1 via OPHTHALMIC
  Filled 2018-01-26: qty 1

## 2018-01-26 MED ORDER — TETRACAINE HCL 0.5 % OP SOLN
2.0000 [drp] | Freq: Once | OPHTHALMIC | Status: AC
  Administered 2018-01-26: 2 [drp] via OPHTHALMIC
  Filled 2018-01-26: qty 4

## 2018-01-26 MED ORDER — VALACYCLOVIR HCL 500 MG PO TABS
1000.0000 mg | ORAL_TABLET | Freq: Once | ORAL | Status: AC
  Administered 2018-01-26: 1000 mg via ORAL
  Filled 2018-01-26: qty 2

## 2018-01-26 NOTE — ED Triage Notes (Signed)
Pt sent from [facility] for shingles to right eye. Pt's wife states the MD wanted the pt on antiviral IV fluids. Pt first noticed pressure in his right eye 2 days ago.

## 2018-01-26 NOTE — ED Notes (Signed)
Bed: BW62 Expected date:  Expected time:  Means of arrival:  Comments: POV Shingles right eye

## 2018-01-26 NOTE — Progress Notes (Signed)
Subjective:  Patient ID: Alex [name], male    DOB: [birth year]  Age: 62 y.o. MRN: [id number]  CC: Rash   HPI JOESIAH LONON presents for a 2-day history of painful rash over his right forehead, right face, and right nose with right eye pain, redness, and blurred vision.  Facility-Administered Medications Prior to Visit  Medication Dose Route Frequency Provider Last Rate Last Dose  . cyanocobalamin ((VITAMIN B-12)) injection 1,000 mcg  1,000 mcg Intramuscular Q30 days Hendricks Limes, MD   1,000 mcg at 03/19/17 1437   Outpatient Medications Prior to Visit  Medication Sig Dispense Refill  . aspirin 81 MG chewable tablet aspirin 81 mg chewable tablet  Chew 1 tablet twice a day by oral route for 4 weeks for 30 days.    . celecoxib (CELEBREX) 200 MG capsule Take 200 mg by mouth 2 (two) times daily.  0  . chlorthalidone (HYGROTON) 25 MG tablet Take 1 tablet (25 mg total) by mouth daily. 90 tablet 1  . Cyanocobalamin (VITAMIN B-12 IJ) Inject as directed. monthly    . diltiazem (CARDIZEM CD) 240 MG 24 hr capsule take 1 capsule by mouth once daily 30 capsule 5  . esomeprazole (NEXIUM) 40 MG capsule take 1 capsule by mouth every morning BEFORE BREAKFAST 30 capsule 11  . ferrous sulfate 325 (65 FE) MG tablet ferrous sulfate 325 mg (65 mg iron) tablet  Take 1 tablet 3 times a day by oral route for 2-3 weeks.    . fexofenadine (ALLEGRA) 180 MG tablet Take 180 mg by mouth daily.      Marland Kitchen losartan (COZAAR) 100 MG tablet Take 1 tablet (100 mg total) by mouth daily. 30 tablet 3  . potassium chloride SA (K-DUR,KLOR-CON) 20 MEQ tablet Take 1 tablet (20 mEq total) by mouth 3 (three) times daily. 270 tablet 1  . meloxicam (MOBIC) 15 MG tablet Take 15 mg by mouth daily.    . traMADol (ULTRAM) 50 MG tablet take 1 tablet by mouth once daily if needed for pain  0    ROS Review of Systems  Constitutional: Negative.  Negative for chills and fever.  HENT: Negative.  Negative for sore throat.     Eyes: Positive for redness and visual disturbance. Negative for photophobia and pain.  Respiratory: Negative.  Negative for cough, chest tightness and shortness of breath.   Cardiovascular: Negative for chest pain, palpitations and leg swelling.  Gastrointestinal: Negative for abdominal pain, diarrhea and nausea.  Endocrine: Negative.   Genitourinary: Negative.  Negative for decreased urine volume and difficulty urinating.  Musculoskeletal: Negative.  Negative for arthralgias, myalgias and neck pain.  Skin: Positive for rash. Negative for color change, pallor and wound.  Allergic/Immunologic: Negative.   Neurological: Negative.   Hematological: Negative for adenopathy. Does not bruise/bleed easily.  Psychiatric/Behavioral: Negative.     Objective:  BP 138/80 (BP Location: Left Arm, Patient Position: Sitting, Cuff Size: Normal)   Pulse (!) 116   Temp 98.2 F (36.8 C) (Oral)   Ht 5\' 10"  (1.778 m)   Wt 215 lb (97.5 kg)   SpO2 97%   BMI 30.85 kg/m   BP Readings from Last 3 Encounters:  01/26/18 (!) 128/101  01/26/18 138/80  12/31/17 125/69    Wt Readings from Last 3 Encounters:  01/26/18 215 lb (97.5 kg)  12/31/17 215 lb 3.2 oz (97.6 kg)  12/30/17 214 lb (97.1 kg)    Physical Exam  Constitutional: He is oriented to person, place, and  time. No distress.  HENT:  Head:    Mouth/Throat: Oropharynx is clear and moist. No oropharyngeal exudate.  There are scattered vesicles on an erythematous base over the tip of the nose extending up the nose and on to the right forehead.  There is no involvement of the right ear.  The right eye is intensely injected.  Eyes: Pupils are equal, round, and reactive to light. EOM are normal. Left eye exhibits no discharge. Right conjunctiva is injected. Left conjunctiva is not injected. No scleral icterus.  Neck: Normal range of motion. Neck supple. No thyromegaly present.  Cardiovascular: Normal rate, regular rhythm and normal heart sounds. Exam  reveals no gallop.  No murmur heard. Pulmonary/Chest: Effort normal and breath sounds normal. No respiratory distress. He has no wheezes. He has no rales.  Abdominal: Soft. Bowel sounds are normal. He exhibits no mass. There is no tenderness. There is no rebound.  Musculoskeletal: Normal range of motion. He exhibits no edema, tenderness or deformity.  Neurological: He is alert and oriented to person, place, and time.  Skin: Skin is warm and dry. Rash noted. He is not diaphoretic. No erythema. No pallor.  Vitals reviewed.   Lab Results  Component Value Date   WBC 5.0 12/30/2017   HGB 16.2 12/30/2017   HCT 46.3 12/30/2017   PLT 190.0 12/30/2017   GLUCOSE 116 (H) 12/30/2017   CHOL 173 01/22/2017   TRIG 96.0 01/22/2017   HDL 41.90 01/22/2017   LDLDIRECT 146.5 12/05/2010   LDLCALC 111 (H) 01/22/2017   ALT 42 12/30/2017   AST 26 12/30/2017   NA 139 12/30/2017   K 3.4 (L) 12/30/2017   CL 100 12/30/2017   CREATININE 0.99 12/30/2017   BUN 29 (H) 12/30/2017   CO2 31 12/30/2017   TSH 1.88 01/22/2017   PSA 1.15 01/22/2017   HGBA1C 5.6 07/24/2016    Dg Finger Ring Right  Result Date: 08/13/2013 CLINICAL DATA:  Injury. EXAM: RIGHT RING FINGER 2+V COMPARISON:  None. FINDINGS: Soft tissue injury right 4th finger distal phalanx level without underlying fracture or dislocation. IMPRESSION: Soft tissue injury right 4th finger distal phalanx level without underlying fracture or dislocation. Electronically Signed   By: Chauncey Cruel M.D.   On: 08/13/2013 12:50    Assessment & Plan:   Jodey was seen today for rash.  Diagnoses and all orders for this visit:  Herpes zoster ophthalmicus of right eye- I think this should be treated with IV acyclovir.  I have referred him to the ED to initiate this therapy.   I have discontinued Pierce Crane. Staniszewski "Mike"'s meloxicam and traMADol. I am also having him maintain his fexofenadine, Cyanocobalamin (VITAMIN B-12 IJ), diltiazem, losartan,  esomeprazole, potassium chloride SA, chlorthalidone, ferrous sulfate, aspirin, and celecoxib. We will continue to administer cyanocobalamin.  No orders of the defined types were placed in this encounter.    Follow-up: Return if symptoms worsen or fail to improve.  Scarlette Calico, MD

## 2018-01-26 NOTE — Telephone Encounter (Signed)
Charted in error.

## 2018-01-26 NOTE — Patient Instructions (Signed)
Shingles Shingles, which is also known as herpes zoster, is an infection that causes a painful skin rash and fluid-filled blisters. Shingles is not related to genital herpes, which is a sexually transmitted infection. Shingles only develops in people who:  Have had chickenpox.  Have received the chickenpox vaccine. (This is rare.)  What are the causes? Shingles is caused by varicella-zoster virus (VZV). This is the same virus that causes chickenpox. After exposure to VZV, the virus stays in the body in an inactive (dormant) state. Shingles develops if the virus reactivates. This can happen many years after the initial exposure to VZV. It is not known what causes this virus to reactivate. What increases the risk? People who have had chickenpox or received the chickenpox vaccine are at risk for shingles. Infection is more common in people who:  Are older than age 50.  Have a weakened defense (immune) system, such as those with HIV, AIDS, or cancer.  Are taking medicines that weaken the immune system, such as transplant medicines.  Are under great stress.  What are the signs or symptoms? Early symptoms of this condition include itching, tingling, and pain in an area on your skin. Pain may be described as burning, stabbing, or throbbing. A few days or weeks after symptoms start, a painful red rash appears, usually on one side of the body in a bandlike or beltlike pattern. The rash eventually turns into fluid-filled blisters that break open, scab over, and dry up in about 2-3 weeks. At any time during the infection, you may also develop:  A fever.  Chills.  A headache.  An upset stomach.  How is this diagnosed? This condition is diagnosed with a skin exam. Sometimes, skin or fluid samples are taken from the blisters before a diagnosis is made. These samples are examined under a microscope or sent to a lab for testing. How is this treated? There is no specific cure for this condition.  Your health care provider will probably prescribe medicines to help you manage pain, recover more quickly, and avoid long-term problems. Medicines may include:  Antiviral drugs.  Anti-inflammatory drugs.  Pain medicines.  If the area involved is on your face, you may be referred to a specialist, such as an eye doctor (ophthalmologist) or an ear, nose, and throat (ENT) doctor to help you avoid eye problems, chronic pain, or disability. Follow these instructions at home: Medicines  Take medicines only as directed by your health care provider.  Apply an anti-itch or numbing cream to the affected area as directed by your health care provider. Blister and Rash Care  Take a cool bath or apply cool compresses to the area of the rash or blisters as directed by your health care provider. This may help with pain and itching.  Keep your rash covered with a loose bandage (dressing). Wear loose-fitting clothing to help ease the pain of material rubbing against the rash.  Keep your rash and blisters clean with mild soap and cool water or as directed by your health care provider.  Check your rash every day for signs of infection. These include redness, swelling, and pain that lasts or increases.  Do not pick your blisters.  Do not scratch your rash. General instructions  Rest as directed by your health care provider.  Keep all follow-up visits as directed by your health care provider. This is important.  Until your blisters scab over, your infection can cause chickenpox in people who have never had it or been vaccinated   against it. To prevent this from happening, avoid contact with other people, especially: ? Babies. ? Pregnant women. ? Children who have eczema. ? Elderly people who have transplants. ? People who have chronic illnesses, such as leukemia or AIDS. Contact a health care provider if:  Your pain is not relieved with prescribed medicines.  Your pain does not get better after  the rash heals.  Your rash looks infected. Signs of infection include redness, swelling, and pain that lasts or increases. Get help right away if:  The rash is on your face or nose.  You have facial pain, pain around your eye area, or loss of feeling on one side of your face.  You have ear pain or you have ringing in your ear.  You have loss of taste.  Your condition gets worse. This information is not intended to replace advice given to you by your health care provider. Make sure you discuss any questions you have with your health care provider. Document Released: 10/14/2005 Document Revised: 06/09/2016 Document Reviewed: 08/25/2014 Elsevier Interactive Patient Education  2018 [employer]  

## 2018-01-26 NOTE — Discharge Instructions (Signed)
You were seen here today for shingles. I spoke with the ophthalmologist about your care. We are placing you on an antiviral called Valtrex.  Please take this as prescribed. Please follow-up with the ophthalmologist tomorrow.  Please use information provided in order to call and schedule an appointment tomorrow.  They are aware you will be calling.  It is important to follow-up to make sure that you do not have any sequela of the eye.  This can damage or even caused loss of  vision if not treated appropriately. Return for loss of vision or any concerning symptoms.

## 2018-01-26 NOTE — ED Provider Notes (Addendum)
[facility] DEPT Provider Note   CSN: [id number] Arrival date & time: 01/26/18  1506     History   Chief Complaint Chief Complaint  Patient presents with  . Herpes Zoster    right eye    HPI Alex [name] is a 62 y.o. male who presents to the emergency department today for suspected shingles.  Patient states on Friday he started noticing a red eye.  Since that time he has noticed a vesicular-like rash that has started across his right upper forehead and also on the right side of his nose.  He notes that Saturday he started having mildly blurry vision of the right eye.  He states that he feels a pressure in his right eye.  He was seen by his PCP who believes he may have shingles and sent him over for IV antivirals.  Patient denies any history of immunosuppression. Denies trauma, fever, HA, N/V, loss of vision, flashers, floaters, blurring, diplopia, photophobia, FB sensation, discharge painful EOM. Patient had shingles as a child.   HPI  Past Medical History:  Diagnosis Date  . Achilles tendinitis    PMH of  . B12 deficiency   . Barrett's esophagus   . Diverticulosis of colon (without mention of hemorrhage) 2010  . Fatty liver 10-16-2011   [place]  . GERD (gastroesophageal reflux disease)   . Gilbert syndrome   . Hiatal hernia   . 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 Hyperlipidemia   . Hypertension   . Nonspecific elevation of levels of transaminase or lactic acid dehydrogenase (LDH)    PMH of    Patient Active Problem List   Diagnosis Date Noted  . Herpes zoster ophthalmicus of right eye 01/26/2018  . Snoring 01/22/2017  . Hyperglycemia 07/24/2016  . Routine general medical examination at a health care facility 01/17/2016  . Hypokalemia 01/14/2015  . Hyperlipidemia with target LDL less than 100 11/23/2009  . GILBERT'S SYNDROME 11/23/2009  . B12 deficiency 10/18/2009  . ALLERGIC RHINITIS 11/16/2008  . BPH associated with nocturia 11/16/2008  .  Essential hypertension 09/17/2007  . 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 Esophageal reflux 09/15/2007  . Barrett's esophagus 09/15/2007    Past Surgical History:  Procedure Laterality Date  . COLONOSCOPY  2010   negative  . KNEE ARTHROSCOPY     R 2015 & L 2016; [facility]  . UPPER GASTROINTESTINAL ENDOSCOPY     Dr Sharlett Iles; multiple  snce 1992  . VASECTOMY    . WISDOM TOOTH EXTRACTION          Home Medications    Prior to Admission medications   Medication Sig Start Date End Date Taking? Authorizing Provider  aspirin 81 MG chewable tablet aspirin 81 mg chewable tablet  Chew 1 tablet twice a day by oral route for 4 weeks for 30 days.    [provider]  celecoxib (CELEBREX) 200 MG capsule Take 200 mg by mouth 2 (two) times daily. 01/10/18   [provider]  chlorthalidone (HYGROTON) 25 MG tablet Take 1 tablet (25 mg total) by mouth daily. 01/12/18   Janith Lima, MD  Cyanocobalamin (VITAMIN B-12 IJ) Inject as directed. monthly    [provider]  diltiazem (CARDIZEM CD) 240 MG 24 hr capsule take 1 capsule by mouth once daily 08/18/17   Janith Lima, MD  esomeprazole (NEXIUM) 40 MG capsule take 1 capsule by mouth every morning BEFORE BREAKFAST 11/18/17   Irene Shipper, MD  ferrous sulfate 325 (  65 FE) MG tablet ferrous sulfate 325 mg (65 mg iron) tablet  Take 1 tablet 3 times a day by oral route for 2-3 weeks.    [provider]  fexofenadine (ALLEGRA) 180 MG tablet Take 180 mg by mouth daily.      [provider]  losartan (COZAAR) 100 MG tablet Take 1 tablet (100 mg total) by mouth daily. 11/17/17   Janith Lima, MD  potassium chloride SA (K-DUR,KLOR-CON) 20 MEQ tablet Take 1 tablet (20 mEq total) by mouth 3 (three) times daily. 12/31/17   Janith Lima, MD    Family History Family History  Problem Relation Age of Onset  . Esophageal cancer Father   . Stomach cancer Father   . Hypertension Father   . Heart disease Father          CBAG @ 32  . Hypertension Mother   . Heart attack Mother 63  . Cancer Mother   . Hypertension Sister   . Hypertension Sister   . Hypertension Sister   . Stroke Neg Hx   . Diabetes Neg Hx   . Early death Neg Hx   . Hyperlipidemia Neg Hx   . Kidney disease Neg Hx   . Alcohol abuse Neg Hx     Social History Social History   Tobacco Use  . Smoking status: Never Smoker  . Smokeless tobacco: Never Used  Substance Use Topics  . Alcohol use: No  . Drug use: No     Allergies   Patient has no known allergies.   Review of Systems Review of Systems  All other systems reviewed and are negative.    Physical Exam Updated Vital Signs BP (!) 128/101   Pulse (!) 102   Temp 97.9 F (36.6 C) (Oral)   Resp 18   SpO2 94%   Physical Exam  Constitutional: He appears well-developed and well-nourished.  HENT:  Head: Normocephalic and atraumatic.  Right Ear: External ear normal.  Left Ear: External ear normal.  Eyes: Conjunctivae are normal. Right eye exhibits no discharge. Left eye exhibits no discharge. No scleral icterus.  Slit lamp exam:      The right eye shows no corneal abrasion, no corneal flare, no corneal ulcer, no foreign body, no hyphema, no hypopyon, no fluorescein uptake and no anterior chamber bulge.  Appearance there is noted conjunctival and scleral erythema that is limbic sparing. PEERL intact. EOMI without nystagmus or pain. No photophobia or consensual photophobia.  Slit-lamp exam without evidence of corneal abrasion or dendritic branching.  Negative Seidel sign.  No hyphema or hypopyon.  No cell and flare  Pulmonary/Chest: Effort normal. No respiratory distress.  Neurological: He is alert.  No facial droop.  Normal speech.  Cranial nerves grossly intact.  Skin: Skin is warm and dry. Rash noted. No pallor.  Patient with vesicular-like rash in different stages that follows the V1 distribution.  Does not cross midline.  He is noted to have Hutchinson sign.   Psychiatric: He has a normal mood and affect.  Nursing note and vitals reviewed.      ED Treatments / Results  Labs (all labs ordered are listed, but only abnormal results are displayed) Labs Reviewed - No data to display  EKG None  Radiology No results found.  Procedures Procedures (including critical care time)  Medications Ordered in ED Medications  valACYclovir (VALTREX) tablet 1,000 mg (has no administration in time range)  fluorescein ophthalmic strip 1 strip (1 strip Right Eye Given  01/26/18 1624)  tetracaine (PONTOCAINE) 0.5 % ophthalmic solution 2 drop (2 drops Right Eye Given 01/26/18 1624)     Initial Impression / Assessment and Plan / ED Course  I have reviewed the triage vital signs and the nursing notes.  Pertinent labs & imaging results that were available during my care of the patient were reviewed by me and considered in my medical decision making (see chart for details).     62 y.o. male with herpes zoster that follows the V1 distribution.  He does have a positive Hutchinson sign.  No obvious dendritic staining.  No cell or flare.  He does report some blurring vision of his right eye.  Visual acuity 20/50 on the right and 20/25 on the left.  I spoke with Dr. Manuella Ghazi of ophthalmology who recommended Valtrex 1 g 3 times daily and close follow-up in 24 hours in the office.  Discussed with patient that he may lose his vision from this and there is very important to follow-up. Specific return precautions discussed. Time was given for all questions to be answered. The patient verbalized understanding and agreement with plan. The patient appears safe for discharge home.  Patient case discussed with Dr. Ralene Bathe who is in agreement with plan.  Final Clinical Impressions(s) / ED Diagnoses   Final diagnoses:  Herpes zoster ophthalmicus    ED Discharge Orders        Ordered    valACYclovir (VALTREX) 1000 MG tablet  3 times daily     01/26/18 1819       Jillyn Ledger, PA-C 01/26/18 1822    Jillyn Ledger, PA-C 01/26/18 L[place] Gene    Quintella Reichert, MD 01/27/18 1450

## 2018-01-27 DIAGNOSIS — B0230 Zoster ocular disease, unspecified: Secondary | ICD-10-CM | POA: Diagnosis not present

## 2018-01-28 ENCOUNTER — Encounter: Payer: Self-pay | Admitting: Internal Medicine

## 2018-02-02 DIAGNOSIS — M25561 Pain in right knee: Secondary | ICD-10-CM | POA: Diagnosis not present

## 2018-02-02 DIAGNOSIS — M1711 Unilateral primary osteoarthritis, right knee: Secondary | ICD-10-CM | POA: Diagnosis not present

## 2018-02-04 ENCOUNTER — Ambulatory Visit: Payer: Self-pay

## 2018-02-04 DIAGNOSIS — M25561 Pain in right knee: Secondary | ICD-10-CM | POA: Diagnosis not present

## 2018-02-04 DIAGNOSIS — M1711 Unilateral primary osteoarthritis, right knee: Secondary | ICD-10-CM | POA: Diagnosis not present

## 2018-02-06 DIAGNOSIS — M25561 Pain in right knee: Secondary | ICD-10-CM | POA: Diagnosis not present

## 2018-02-06 DIAGNOSIS — M1711 Unilateral primary osteoarthritis, right knee: Secondary | ICD-10-CM | POA: Diagnosis not present

## 2018-02-09 DIAGNOSIS — M25561 Pain in right knee: Secondary | ICD-10-CM | POA: Diagnosis not present

## 2018-02-09 DIAGNOSIS — M1711 Unilateral primary osteoarthritis, right knee: Secondary | ICD-10-CM | POA: Diagnosis not present

## 2018-02-10 ENCOUNTER — Encounter: Payer: Self-pay | Admitting: Internal Medicine

## 2018-02-10 ENCOUNTER — Ambulatory Visit (INDEPENDENT_AMBULATORY_CARE_PROVIDER_SITE_OTHER): Payer: BLUE CROSS/BLUE SHIELD | Admitting: Internal Medicine

## 2018-02-10 ENCOUNTER — Other Ambulatory Visit (INDEPENDENT_AMBULATORY_CARE_PROVIDER_SITE_OTHER): Payer: BLUE CROSS/BLUE SHIELD

## 2018-02-10 VITALS — BP 160/100 | HR 98 | Temp 97.9°F | Resp 16 | Ht 70.0 in | Wt 216.0 lb

## 2018-02-10 DIAGNOSIS — R351 Nocturia: Secondary | ICD-10-CM | POA: Diagnosis not present

## 2018-02-10 DIAGNOSIS — E876 Hypokalemia: Secondary | ICD-10-CM | POA: Diagnosis not present

## 2018-02-10 DIAGNOSIS — I10 Essential (primary) hypertension: Secondary | ICD-10-CM

## 2018-02-10 DIAGNOSIS — N401 Enlarged prostate with lower urinary tract symptoms: Secondary | ICD-10-CM | POA: Diagnosis not present

## 2018-02-10 DIAGNOSIS — E538 Deficiency of other specified B group vitamins: Secondary | ICD-10-CM

## 2018-02-10 DIAGNOSIS — R739 Hyperglycemia, unspecified: Secondary | ICD-10-CM | POA: Diagnosis not present

## 2018-02-10 DIAGNOSIS — Z0000 Encounter for general adult medical examination without abnormal findings: Secondary | ICD-10-CM

## 2018-02-10 DIAGNOSIS — E559 Vitamin D deficiency, unspecified: Secondary | ICD-10-CM | POA: Insufficient documentation

## 2018-02-10 LAB — LIPID PANEL
Cholesterol: 158 mg/dL (ref 0–200)
HDL: 39.4 mg/dL (ref >39.00)
LDL Cholesterol: 86 mg/dL (ref 0–99)
NonHDL: 118.63
Total CHOL/HDL Ratio: 4
Triglycerides: 165 mg/dL — ABNORMAL HIGH (ref 0.0–149.0)
VLDL: 33 mg/dL (ref 0.0–40.0)

## 2018-02-10 LAB — BASIC METABOLIC PANEL
BUN: 16 mg/dL (ref 6–23)
CO2: 31 mEq/L (ref 19–32)
Calcium: 9.3 mg/dL (ref 8.4–10.5)
Chloride: 98 mEq/L (ref 96–112)
Creatinine, Ser: 0.94 mg/dL (ref 0.40–1.50)
GFR: 86.79 mL/min (ref >60.00)
Glucose, Bld: 104 mg/dL — ABNORMAL HIGH (ref 70–99)
Potassium: 3.8 mEq/L (ref 3.5–5.1)
Sodium: 138 mEq/L (ref 135–145)

## 2018-02-10 LAB — URINALYSIS, ROUTINE W REFLEX MICROSCOPIC
Bilirubin Urine: NEGATIVE
Hgb urine dipstick: NEGATIVE
Ketones, ur: NEGATIVE
Leukocytes, UA: NEGATIVE
Nitrite: NEGATIVE
Specific Gravity, Urine: 1.02 (ref 1.000–1.030)
Total Protein, Urine: NEGATIVE
Urine Glucose: NEGATIVE
Urobilinogen, UA: 0.2 (ref 0.0–1.0)
pH: 7.5 (ref 5.0–8.0)

## 2018-02-10 LAB — HEMOGLOBIN A1C: Hgb A1c MFr Bld: 5.3 % (ref 4.6–6.5)

## 2018-02-10 LAB — PSA: PSA: 0.87 ng/mL (ref 0.10–4.00)

## 2018-02-10 LAB — VITAMIN D 25 HYDROXY (VIT D DEFICIENCY, FRACTURES): VITD: 22.44 ng/mL — ABNORMAL LOW (ref 30.00–100.00)

## 2018-02-10 LAB — MAGNESIUM: Magnesium: 1.7 mg/dL (ref 1.5–2.5)

## 2018-02-10 MED ORDER — CYANOCOBALAMIN 1000 MCG/ML IJ SOLN
1000.0000 ug | Freq: Once | INTRAMUSCULAR | Status: AC
  Administered 2018-02-10: 1000 ug via INTRAMUSCULAR

## 2018-02-10 MED ORDER — CHOLECALCIFEROL 50 MCG (2000 UT) PO TABS: 1.0000 | ORAL_TABLET | Freq: Every day | ORAL | 1 refills | Status: DC

## 2018-02-10 NOTE — Patient Instructions (Signed)

## 2018-02-10 NOTE — Progress Notes (Signed)
Subjective:  Patient ID: Alex [name], male    DOB: [birth year]  Age: 62 y.o. MRN: [id number]  CC: Hypertension; Hyperlipidemia; and Annual Exam   HPI Alex [name] presents for a CPX.  He is finishing up a course of Valtrex for ophthalmic zoster.  He is followed closely by ophthalmology.  His right eye is still red and he has mild photophobia but he denies loss of vision.  He is concerned that his blood pressure is not adequately well controlled.  He is compliant with his medications but has not done much with his lifestyle modifications due to a recent knee surgery.  He denies chest pain, palpitations, shortness of breath, edema, or fatigue.  Outpatient Medications Prior to Visit  Medication Sig Dispense Refill  . chlorthalidone (HYGROTON) 25 MG tablet Take 1 tablet (25 mg total) by mouth daily. 90 tablet 1  . Cyanocobalamin (VITAMIN B-12 IJ) Inject as directed. monthly    . diltiazem (CARDIZEM CD) 240 MG 24 hr capsule take 1 capsule by mouth once daily 30 capsule 5  . esomeprazole (NEXIUM) 40 MG capsule take 1 capsule by mouth every morning BEFORE BREAKFAST 30 capsule 11  . fexofenadine (ALLEGRA) 180 MG tablet Take 180 mg by mouth daily.      Marland Kitchen losartan (COZAAR) 100 MG tablet Take 1 tablet (100 mg total) by mouth daily. 30 tablet 3  . potassium chloride SA (K-DUR,KLOR-CON) 20 MEQ tablet Take 1 tablet (20 mEq total) by mouth 3 (three) times daily. 270 tablet 1  . acetaminophen (TYLENOL) 325 MG tablet Take 650 mg by mouth 4 (four) times daily as needed (KNEE PAIN).    . ferrous sulfate 325 (65 FE) MG tablet 325 MG ONCE DAILY    . aspirin 81 MG chewable tablet aspirin 81 mg chewable tablet  Chew 1 tablet twice a day by oral route for 4 weeks for 30 days.    . celecoxib (CELEBREX) 200 MG capsule Take 200 mg by mouth 2 (two) times daily.  0   Facility-Administered Medications Prior to Visit  Medication Dose Route Frequency Provider Last Rate Last Dose  . cyanocobalamin  ((VITAMIN B-12)) injection 1,000 mcg  1,000 mcg Intramuscular Q30 days Hendricks Limes, MD   1,000 mcg at 03/19/17 1437    ROS Review of Systems  Constitutional: Negative for activity change, diaphoresis, fatigue and unexpected weight change.  HENT: Negative.   Eyes: Positive for photophobia and redness. Negative for pain, discharge, itching and visual disturbance.  Respiratory: Negative for apnea, cough, chest tightness, shortness of breath and wheezing.   Cardiovascular: Negative for chest pain, palpitations and leg swelling.  Gastrointestinal: Negative for abdominal pain, constipation, diarrhea, nausea and vomiting.  Endocrine: Negative.   Genitourinary: Negative.  Negative for difficulty urinating, scrotal swelling, testicular pain and urgency.  Musculoskeletal: Negative.  Negative for arthralgias, back pain, myalgias and neck pain.  Skin: Negative.  Negative for color change and rash.  Allergic/Immunologic: Negative.   Neurological: Negative.  Negative for dizziness, weakness, light-headedness and headaches.  Hematological: Negative for adenopathy. Does not bruise/bleed easily.  Psychiatric/Behavioral: Negative.     Objective:  BP (!) 160/100 (BP Location: Right Arm, Patient Position: Sitting, Cuff Size: Normal)   Pulse 98   Temp 97.9 F (36.6 C) (Oral)   Resp 16   Ht 5\' 10"  (1.778 m)   Wt 216 lb 0.6 oz (98 kg)   SpO2 97%   BMI 31.00 kg/m   BP Readings from Last 3 Encounters:  02/10/18 (!) 160/100  01/26/18 (!) 128/101  01/26/18 138/80    Wt Readings from Last 3 Encounters:  02/10/18 216 lb 0.6 oz (98 kg)  01/26/18 215 lb (97.5 kg)  12/31/17 215 lb 3.2 oz (97.6 kg)    Physical Exam  Constitutional: He appears well-developed and well-nourished. No distress.  HENT:  Mouth/Throat: Oropharynx is clear and moist. No oropharyngeal exudate.  Eyes: Pupils are equal, round, and reactive to light. EOM are normal. Right eye exhibits no discharge. Left eye exhibits no  discharge. Scleral icterus is present.    Right eye is mildly injected on the bulbar conjunctival surface.  Neck: Normal range of motion. Neck supple. No JVD present. No thyromegaly present.  Cardiovascular: Normal rate, regular rhythm and normal heart sounds. Exam reveals no gallop and no friction rub.  No murmur heard. Pulmonary/Chest: Effort normal and breath sounds normal. No respiratory distress. He has no wheezes. He has no rales.  Abdominal: Soft. Bowel sounds are normal. He exhibits no distension and no mass. There is no tenderness. There is no guarding. Hernia confirmed negative in the right inguinal area and confirmed negative in the left inguinal area.  Genitourinary: Testes normal and penis normal. Rectal exam shows no external hemorrhoid, no internal hemorrhoid, no fissure, no mass, no tenderness, anal tone normal and guaiac negative stool. Prostate is enlarged (2+ smooth symm BPH). Prostate is not tender. Cremasteric reflex is present. Right testis shows no mass, no swelling and no tenderness. Left testis shows no mass, no swelling and no tenderness. Circumcised. No penile erythema or penile tenderness. No discharge found.  Lymphadenopathy:    He has no cervical adenopathy. No inguinal adenopathy noted on the right or left side.  Skin: Skin is warm and dry. No rash noted. He is not diaphoretic.  The facial rash has resolved  Psychiatric: He has a normal mood and affect. His behavior is normal. Judgment and thought content normal.  Vitals reviewed.   Lab Results  Component Value Date   WBC 5.0 12/30/2017   HGB 16.2 12/30/2017   HCT 46.3 12/30/2017   PLT 190.0 12/30/2017   GLUCOSE 104 (H) 02/10/2018   CHOL 158 02/10/2018   TRIG 165.0 (H) 02/10/2018   HDL 39.40 02/10/2018   LDLDIRECT 146.5 12/05/2010   LDLCALC 86 02/10/2018   ALT 42 12/30/2017   AST 26 12/30/2017   NA 138 02/10/2018   K 3.8 02/10/2018   CL 98 02/10/2018   CREATININE 0.94 02/10/2018   BUN 16 02/10/2018     CO2 31 02/10/2018   TSH 2.27 02/10/2018   PSA 0.87 02/10/2018   HGBA1C 5.3 02/10/2018    No results found.  Assessment & Plan:   Alex [name] was seen today for hypertension, hyperlipidemia and annual exam.  Diagnoses and all orders for this visit:  Essential hypertension- His blood pressure is not adequately well controlled.  He will continue his current 3 drug regimen.  I have asked him to stop taking the anti-inflammatories.  The only remarkable finding on his lab work is vitamin D deficiency which I will treat.  Once he recovers from the knee surgery he will improve his lifestyle modifications. -     Basic metabolic panel; Future -     Magnesium; Future -     VITAMIN D 25 Hydroxy (Vit-D Deficiency, Fractures); Future -     Urinalysis, Routine w reflex microscopic; Future -     Thyroid Panel With TSH; Future  BPH associated with nocturia- His PSA  is low which is reassuring that he does not have prostate cancer.  He does not have any symptoms that need to be treated.  Hyperglycemia- improvement noted -     Basic metabolic panel; Future -     Hemoglobin A1c; Future  Hypokalemia- His potassium level is normal.  He will continue the current potassium supplementation. -     Basic metabolic panel; Future -     Magnesium; Future  Routine general medical examination at a health care facility- Exam completed, labs reviewed, vaccines reviewed and updated, screening for colon cancer is up-to-date, patient education material was given. -     Lipid panel; Future -     PSA; Future  B12 deficiency -     cyanocobalamin ((VITAMIN B-12)) injection 1,000 mcg  Vitamin D deficiency -     Cholecalciferol 2000 units TABS; Take 1 tablet (2,000 Units total) by mouth daily.   I have discontinued Pierce Crane. [place] "Mike"'s aspirin and celecoxib. I am also having him start on Cholecalciferol. Additionally, I am having him maintain his fexofenadine, Cyanocobalamin (VITAMIN B-12 IJ), diltiazem,  losartan, esomeprazole, potassium chloride SA, chlorthalidone, ferrous sulfate, and acetaminophen. We administered cyanocobalamin. We will continue to administer cyanocobalamin.  Meds ordered this encounter  Medications  . cyanocobalamin ((VITAMIN B-12)) injection 1,000 mcg  . Cholecalciferol 2000 units TABS    Sig: Take 1 tablet (2,000 Units total) by mouth daily.    Dispense:  90 tablet    Refill:  1     Follow-up: Return in about 2 months (around 04/12/2018).  Scarlette Calico, MD

## 2018-02-11 ENCOUNTER — Encounter: Payer: Self-pay | Admitting: Internal Medicine

## 2018-02-11 DIAGNOSIS — M1712 Unilateral primary osteoarthritis, left knee: Secondary | ICD-10-CM | POA: Diagnosis not present

## 2018-02-11 LAB — THYROID PANEL WITH TSH
Free Thyroxine Index: 2.8 (ref 1.4–3.8)
T3 Uptake: 32 % (ref 22–35)
T4, Total: 8.9 ug/dL (ref 4.9–10.5)
TSH: 2.27 mIU/L (ref 0.40–4.50)

## 2018-02-12 ENCOUNTER — Ambulatory Visit: Payer: Self-pay

## 2018-02-12 DIAGNOSIS — M1711 Unilateral primary osteoarthritis, right knee: Secondary | ICD-10-CM | POA: Diagnosis not present

## 2018-02-12 DIAGNOSIS — M25561 Pain in right knee: Secondary | ICD-10-CM | POA: Diagnosis not present

## 2018-02-15 ENCOUNTER — Other Ambulatory Visit: Payer: Self-pay | Admitting: Internal Medicine

## 2018-02-15 DIAGNOSIS — I10 Essential (primary) hypertension: Secondary | ICD-10-CM

## 2018-02-16 DIAGNOSIS — M1711 Unilateral primary osteoarthritis, right knee: Secondary | ICD-10-CM | POA: Diagnosis not present

## 2018-02-16 DIAGNOSIS — M25561 Pain in right knee: Secondary | ICD-10-CM | POA: Diagnosis not present

## 2018-02-16 DIAGNOSIS — B0230 Zoster ocular disease, unspecified: Secondary | ICD-10-CM | POA: Diagnosis not present

## 2018-02-18 DIAGNOSIS — M1711 Unilateral primary osteoarthritis, right knee: Secondary | ICD-10-CM | POA: Diagnosis not present

## 2018-02-18 DIAGNOSIS — M25561 Pain in right knee: Secondary | ICD-10-CM | POA: Diagnosis not present

## 2018-02-19 DIAGNOSIS — Z96652 Presence of left artificial knee joint: Secondary | ICD-10-CM | POA: Insufficient documentation

## 2018-02-19 DIAGNOSIS — M1712 Unilateral primary osteoarthritis, left knee: Secondary | ICD-10-CM | POA: Diagnosis not present

## 2018-02-19 HISTORY — PX: REPLACEMENT TOTAL KNEE: SUR1224

## 2018-02-23 DIAGNOSIS — M1711 Unilateral primary osteoarthritis, right knee: Secondary | ICD-10-CM | POA: Diagnosis not present

## 2018-02-23 DIAGNOSIS — M25562 Pain in left knee: Secondary | ICD-10-CM | POA: Insufficient documentation

## 2018-02-23 DIAGNOSIS — B0230 Zoster ocular disease, unspecified: Secondary | ICD-10-CM | POA: Diagnosis not present

## 2018-02-23 DIAGNOSIS — M1712 Unilateral primary osteoarthritis, left knee: Secondary | ICD-10-CM | POA: Diagnosis not present

## 2018-02-26 DIAGNOSIS — M1712 Unilateral primary osteoarthritis, left knee: Secondary | ICD-10-CM | POA: Diagnosis not present

## 2018-02-26 DIAGNOSIS — M25562 Pain in left knee: Secondary | ICD-10-CM | POA: Diagnosis not present

## 2018-03-02 DIAGNOSIS — M1712 Unilateral primary osteoarthritis, left knee: Secondary | ICD-10-CM | POA: Diagnosis not present

## 2018-03-02 DIAGNOSIS — M25562 Pain in left knee: Secondary | ICD-10-CM | POA: Diagnosis not present

## 2018-03-05 DIAGNOSIS — M1712 Unilateral primary osteoarthritis, left knee: Secondary | ICD-10-CM | POA: Diagnosis not present

## 2018-03-05 DIAGNOSIS — M25562 Pain in left knee: Secondary | ICD-10-CM | POA: Diagnosis not present

## 2018-03-09 DIAGNOSIS — M1712 Unilateral primary osteoarthritis, left knee: Secondary | ICD-10-CM | POA: Diagnosis not present

## 2018-03-09 DIAGNOSIS — M25562 Pain in left knee: Secondary | ICD-10-CM | POA: Diagnosis not present

## 2018-03-11 DIAGNOSIS — G4733 Obstructive sleep apnea (adult) (pediatric): Secondary | ICD-10-CM | POA: Diagnosis not present

## 2018-03-12 ENCOUNTER — Ambulatory Visit (INDEPENDENT_AMBULATORY_CARE_PROVIDER_SITE_OTHER): Payer: BLUE CROSS/BLUE SHIELD | Admitting: *Deleted

## 2018-03-12 DIAGNOSIS — E538 Deficiency of other specified B group vitamins: Secondary | ICD-10-CM | POA: Diagnosis not present

## 2018-03-12 DIAGNOSIS — M1712 Unilateral primary osteoarthritis, left knee: Secondary | ICD-10-CM | POA: Diagnosis not present

## 2018-03-12 DIAGNOSIS — M25562 Pain in left knee: Secondary | ICD-10-CM | POA: Diagnosis not present

## 2018-03-12 MED ORDER — CYANOCOBALAMIN 1000 MCG/ML IJ SOLN
1000.0000 ug | Freq: Once | INTRAMUSCULAR | Status: AC
  Administered 2018-03-12: 1000 ug via INTRAMUSCULAR

## 2018-03-13 ENCOUNTER — Ambulatory Visit: Payer: Self-pay

## 2018-03-14 NOTE — Progress Notes (Signed)
I have reviewed and agree.

## 2018-03-15 ENCOUNTER — Other Ambulatory Visit: Payer: Self-pay | Admitting: Internal Medicine

## 2018-03-15 DIAGNOSIS — I10 Essential (primary) hypertension: Secondary | ICD-10-CM

## 2018-03-16 DIAGNOSIS — M1712 Unilateral primary osteoarthritis, left knee: Secondary | ICD-10-CM | POA: Diagnosis not present

## 2018-03-16 DIAGNOSIS — M25562 Pain in left knee: Secondary | ICD-10-CM | POA: Diagnosis not present

## 2018-03-19 DIAGNOSIS — M1712 Unilateral primary osteoarthritis, left knee: Secondary | ICD-10-CM | POA: Diagnosis not present

## 2018-03-19 DIAGNOSIS — M25562 Pain in left knee: Secondary | ICD-10-CM | POA: Diagnosis not present

## 2018-03-25 DIAGNOSIS — H2000 Unspecified acute and subacute iridocyclitis: Secondary | ICD-10-CM | POA: Diagnosis not present

## 2018-03-25 DIAGNOSIS — Z471 Aftercare following joint replacement surgery: Secondary | ICD-10-CM | POA: Diagnosis not present

## 2018-03-25 DIAGNOSIS — Z96652 Presence of left artificial knee joint: Secondary | ICD-10-CM | POA: Diagnosis not present

## 2018-03-26 DIAGNOSIS — M25562 Pain in left knee: Secondary | ICD-10-CM | POA: Diagnosis not present

## 2018-03-26 DIAGNOSIS — M1712 Unilateral primary osteoarthritis, left knee: Secondary | ICD-10-CM | POA: Diagnosis not present

## 2018-04-11 DIAGNOSIS — G4733 Obstructive sleep apnea (adult) (pediatric): Secondary | ICD-10-CM | POA: Diagnosis not present

## 2018-04-15 ENCOUNTER — Telehealth: Payer: Self-pay | Admitting: Internal Medicine

## 2018-04-15 ENCOUNTER — Encounter: Payer: Self-pay | Admitting: Internal Medicine

## 2018-04-15 ENCOUNTER — Ambulatory Visit (INDEPENDENT_AMBULATORY_CARE_PROVIDER_SITE_OTHER): Payer: BLUE CROSS/BLUE SHIELD | Admitting: Internal Medicine

## 2018-04-15 ENCOUNTER — Other Ambulatory Visit (INDEPENDENT_AMBULATORY_CARE_PROVIDER_SITE_OTHER): Payer: BLUE CROSS/BLUE SHIELD

## 2018-04-15 VITALS — BP 130/80 | HR 90 | Temp 97.9°F | Resp 16 | Ht 70.0 in | Wt 222.2 lb

## 2018-04-15 DIAGNOSIS — I10 Essential (primary) hypertension: Secondary | ICD-10-CM

## 2018-04-15 DIAGNOSIS — T502X5A Adverse effect of carbonic-anhydrase inhibitors, benzothiadiazides and other diuretics, initial encounter: Secondary | ICD-10-CM

## 2018-04-15 DIAGNOSIS — H00022 Hordeolum internum right lower eyelid: Secondary | ICD-10-CM | POA: Diagnosis not present

## 2018-04-15 DIAGNOSIS — Z23 Encounter for immunization: Secondary | ICD-10-CM | POA: Insufficient documentation

## 2018-04-15 DIAGNOSIS — E538 Deficiency of other specified B group vitamins: Secondary | ICD-10-CM

## 2018-04-15 DIAGNOSIS — E876 Hypokalemia: Secondary | ICD-10-CM

## 2018-04-15 LAB — BASIC METABOLIC PANEL
BUN: 20 mg/dL (ref 6–23)
CO2: 28 mEq/L (ref 19–32)
Calcium: 9.4 mg/dL (ref 8.4–10.5)
Chloride: 102 mEq/L (ref 96–112)
Creatinine, Ser: 1.03 mg/dL (ref 0.40–1.50)
GFR: 78.06 mL/min (ref >60.00)
Glucose, Bld: 117 mg/dL — ABNORMAL HIGH (ref 70–99)
Potassium: 3.1 mEq/L — ABNORMAL LOW (ref 3.5–5.1)
Sodium: 139 mEq/L (ref 135–145)

## 2018-04-15 MED ORDER — CLINDAMYCIN HCL 300 MG PO CAPS: 300.0000 mg | ORAL_CAPSULE | Freq: Three times a day (TID) | ORAL | 0 refills | Status: AC

## 2018-04-15 MED ORDER — CYANOCOBALAMIN 1000 MCG/ML IJ SOLN
1000.0000 ug | Freq: Once | INTRAMUSCULAR | Status: AC
  Administered 2018-04-15: 1000 ug via INTRAMUSCULAR

## 2018-04-15 MED ORDER — ZOSTER VAC RECOMB ADJUVANTED 50 MCG/0.5ML IM SUSR: 0.5000 mL | Freq: Once | INTRAMUSCULAR | 1 refills | Status: AC

## 2018-04-15 NOTE — Telephone Encounter (Signed)
States on AVS to come back in one week. Patient would like a call to discuss why Dr. Ronnald [name] needs him to come back.

## 2018-04-15 NOTE — Patient Instructions (Signed)

## 2018-04-15 NOTE — Progress Notes (Signed)
Subjective:  Patient ID: Alex [name], male    DOB: 09-Oct-1956  Age: 62 y.o. MRN: [id number]  CC: Hypertension   HPI Alex [name] presents for a BP check - He complains of a 2-day history of right lower eyelid redness, pain, and swelling.  He has not noticed a rash.  He denies eye pain or photophobia.  He also tells me his blood pressure has been well controlled.  He denies any recent episodes of CP, DOE, palpitations, edema, or fatigue.  Outpatient Medications Prior to Visit  Medication Sig Dispense Refill  . acetaminophen (TYLENOL) 325 MG tablet Take 650 mg by mouth 4 (four) times daily as needed (KNEE PAIN).    . Cholecalciferol 2000 units TABS Take 1 tablet (2,000 Units total) by mouth daily. 90 tablet 1  . diltiazem (CARDIZEM CD) 240 MG 24 hr capsule TAKE 1 CAPSULE BY MOUTH ONCE DAILY 90 capsule 0  . esomeprazole (NEXIUM) 40 MG capsule take 1 capsule by mouth every morning BEFORE BREAKFAST 30 capsule 11  . ferrous sulfate 325 (65 FE) MG tablet 325 MG ONCE DAILY    . fexofenadine (ALLEGRA) 180 MG tablet Take 180 mg by mouth daily.      Marland Kitchen losartan (COZAAR) 100 MG tablet TAKE 1 TABLET BY MOUTH ONCE DAILY 90 tablet 1  . potassium chloride SA (K-DUR,KLOR-CON) 20 MEQ tablet Take 1 tablet (20 mEq total) by mouth 3 (three) times daily. 270 tablet 1  . chlorthalidone (HYGROTON) 25 MG tablet Take 1 tablet (25 mg total) by mouth daily. 90 tablet 1  . Cyanocobalamin (VITAMIN B-12 IJ) Inject as directed. monthly     Facility-Administered Medications Prior to Visit  Medication Dose Route Frequency Provider Last Rate Last Dose  . cyanocobalamin ((VITAMIN B-12)) injection 1,000 mcg  1,000 mcg Intramuscular Q30 days Hendricks Limes, MD   1,000 mcg at 03/19/17 1437    ROS Review of Systems  Constitutional: Negative.  Negative for chills, fatigue and fever.  HENT: Negative.  Negative for trouble swallowing.   Eyes: Negative for photophobia, pain, discharge, redness, itching and  visual disturbance.  Respiratory: Negative.  Negative for cough, chest tightness, shortness of breath and wheezing.   Cardiovascular: Negative for chest pain, palpitations and leg swelling.  Gastrointestinal: Negative.  Negative for abdominal pain, constipation, diarrhea, nausea and vomiting.  Endocrine: Negative.   Genitourinary: Negative.  Negative for difficulty urinating, dysuria, testicular pain and urgency.  Musculoskeletal: Negative.  Negative for arthralgias and myalgias.  Skin: Negative.   Neurological: Negative.  Negative for dizziness, weakness and light-headedness.  Hematological: Negative for adenopathy. Does not bruise/bleed easily.  Psychiatric/Behavioral: Negative.     Objective:  BP 130/80 (BP Location: Left Arm, Patient Position: Sitting, Cuff Size: Large)   Pulse 90   Temp 97.9 F (36.6 C) (Oral)   Resp 16   Ht 5\' 10"  (1.778 m)   Wt 222 lb 4 oz (100.8 kg)   SpO2 92%   BMI 31.89 kg/m   BP Readings from Last 3 Encounters:  04/15/18 130/80  02/10/18 (!) 160/100  01/26/18 (!) 128/101    Wt Readings from Last 3 Encounters:  04/15/18 222 lb 4 oz (100.8 kg)  02/10/18 216 lb 0.6 oz (98 kg)  01/26/18 215 lb (97.5 kg)    Physical Exam  Constitutional: He is oriented to person, place, and time. No distress.  HENT:  Mouth/Throat: Oropharynx is clear and moist. No oropharyngeal exudate.  Eyes: Lids are everted and swept, no foreign  bodies found. Right eye exhibits no discharge. Left eye exhibits no discharge. Right conjunctiva is not injected. Right conjunctiva has no hemorrhage. Left conjunctiva is not injected. Left conjunctiva has no hemorrhage. No scleral icterus. Right eye exhibits normal extraocular motion. Left eye exhibits normal extraocular motion.    Neck: Normal range of motion. Neck supple. No JVD present.  Cardiovascular: Normal rate, regular rhythm and normal heart sounds.  No murmur heard. Pulmonary/Chest: Effort normal and breath sounds normal. No  respiratory distress. He has no wheezes. He has no rales.  Abdominal: Soft. Bowel sounds are normal. He exhibits no mass. There is no hepatosplenomegaly. There is no tenderness.  Musculoskeletal: Normal range of motion. He exhibits no edema, tenderness or deformity.  Lymphadenopathy:    He has no cervical adenopathy.  Neurological: He is alert and oriented to person, place, and time.  Skin: Skin is warm and dry. No rash noted. He is not diaphoretic.  Vitals reviewed.   Lab Results  Component Value Date   WBC 5.0 12/30/2017   HGB 16.2 12/30/2017   HCT 46.3 12/30/2017   PLT 190.0 12/30/2017   GLUCOSE 117 (H) 04/15/2018   CHOL 158 02/10/2018   TRIG 165.0 (H) 02/10/2018   HDL 39.40 02/10/2018   LDLDIRECT 146.5 12/05/2010   LDLCALC 86 02/10/2018   ALT 42 12/30/2017   AST 26 12/30/2017   NA 139 04/15/2018   K 3.1 (L) 04/15/2018   CL 102 04/15/2018   CREATININE 1.03 04/15/2018   BUN 20 04/15/2018   CO2 28 04/15/2018   TSH 2.27 02/10/2018   PSA 0.87 02/10/2018   HGBA1C 5.3 02/10/2018    No results found.  Assessment & Plan:   Alex [name] was seen today for hypertension.  Diagnoses and all orders for this visit:  Essential hypertension- His blood pressure is well controlled but he has developed a significant degree of hypokalemia with a potassium level down to 3.1.  I have asked him to stop taking chlorthalidone and will change to spironolactone to control the blood pressure and stabilize his potassium level. -     Basic metabolic panel; Future -     spironolactone (ALDACTONE) 25 MG tablet; Take 1 tablet (25 mg total) by mouth daily.  B12 deficiency -     cyanocobalamin ((VITAMIN B-12)) injection 1,000 mcg  Hordeolum internum of right lower eyelid -     clindamycin (CLEOCIN) 300 MG capsule; Take 1 capsule (300 mg total) by mouth 3 (three) times daily for 7 days.  Need for shingles vaccine -     Zoster Vaccine Adjuvanted [facility]) injection; Inject 0.5 mLs into the muscle  once for 1 dose.  Diuretic-induced hypokalemia -     spironolactone (ALDACTONE) 25 MG tablet; Take 1 tablet (25 mg total) by mouth daily.   I have discontinued Pierce Crane. [place] "Mike"'s Cyanocobalamin (VITAMIN B-12 IJ) and chlorthalidone. I am also having him start on clindamycin, Zoster Vaccine Adjuvanted, and spironolactone. Additionally, I am having him maintain his fexofenadine, esomeprazole, potassium chloride SA, ferrous sulfate, acetaminophen, Cholecalciferol, diltiazem, and losartan. We administered cyanocobalamin. We will continue to administer cyanocobalamin.  Meds ordered this encounter  Medications  . cyanocobalamin ((VITAMIN B-12)) injection 1,000 mcg  . clindamycin (CLEOCIN) 300 MG capsule    Sig: Take 1 capsule (300 mg total) by mouth 3 (three) times daily for 7 days.    Dispense:  21 capsule    Refill:  0  . Zoster Vaccine Adjuvanted [facility]) injection    Sig: Inject 0.5  mLs into the muscle once for 1 dose.    Dispense:  0.5 mL    Refill:  1  . spironolactone (ALDACTONE) 25 MG tablet    Sig: Take 1 tablet (25 mg total) by mouth daily.    Dispense:  90 tablet    Refill:  0     Follow-up: Return in about 1 week (around 04/22/2018).  Scarlette Calico, MD

## 2018-04-16 ENCOUNTER — Encounter: Payer: Self-pay | Admitting: Internal Medicine

## 2018-04-16 MED ORDER — SPIRONOLACTONE 25 MG PO TABS: 25.0000 mg | ORAL_TABLET | Freq: Every day | ORAL | 0 refills | Status: DC

## 2018-04-16 NOTE — Telephone Encounter (Signed)
Called pt and informed of labs and to come back in 3 months to recheck K+. Also informed pt to stop the chlorthalidone and to start the  Aldactone. Pt stated understanding and 3 mo follow up has been scheduled.

## 2018-05-09 ENCOUNTER — Other Ambulatory Visit: Payer: Self-pay | Admitting: Internal Medicine

## 2018-05-09 DIAGNOSIS — I10 Essential (primary) hypertension: Secondary | ICD-10-CM

## 2018-05-11 DIAGNOSIS — G4733 Obstructive sleep apnea (adult) (pediatric): Secondary | ICD-10-CM | POA: Diagnosis not present

## 2018-05-20 ENCOUNTER — Ambulatory Visit (INDEPENDENT_AMBULATORY_CARE_PROVIDER_SITE_OTHER): Payer: BLUE CROSS/BLUE SHIELD | Admitting: *Deleted

## 2018-05-20 DIAGNOSIS — E538 Deficiency of other specified B group vitamins: Secondary | ICD-10-CM

## 2018-05-20 MED ORDER — CYANOCOBALAMIN 1000 MCG/ML IJ SOLN
1000.0000 ug | Freq: Once | INTRAMUSCULAR | Status: AC
  Administered 2018-05-20: 1000 ug via INTRAMUSCULAR

## 2018-05-20 NOTE — Progress Notes (Signed)
I have reviewed and agree.

## 2018-06-13 ENCOUNTER — Other Ambulatory Visit: Payer: Self-pay | Admitting: Internal Medicine

## 2018-06-13 DIAGNOSIS — E559 Vitamin D deficiency, unspecified: Secondary | ICD-10-CM

## 2018-06-24 ENCOUNTER — Ambulatory Visit (INDEPENDENT_AMBULATORY_CARE_PROVIDER_SITE_OTHER): Payer: BLUE CROSS/BLUE SHIELD | Admitting: Emergency Medicine

## 2018-06-24 DIAGNOSIS — E538 Deficiency of other specified B group vitamins: Secondary | ICD-10-CM

## 2018-06-24 MED ORDER — CYANOCOBALAMIN 1000 MCG/ML IJ SOLN
1000.0000 ug | Freq: Once | INTRAMUSCULAR | Status: AC
  Administered 2018-06-24: 1000 ug via INTRAMUSCULAR

## 2018-06-24 NOTE — Progress Notes (Signed)
I have reviewed and agree.

## 2018-06-25 DIAGNOSIS — G4733 Obstructive sleep apnea (adult) (pediatric): Secondary | ICD-10-CM | POA: Diagnosis not present

## 2018-07-09 ENCOUNTER — Other Ambulatory Visit (INDEPENDENT_AMBULATORY_CARE_PROVIDER_SITE_OTHER): Payer: BLUE CROSS/BLUE SHIELD

## 2018-07-09 ENCOUNTER — Encounter: Payer: Self-pay | Admitting: Internal Medicine

## 2018-07-09 ENCOUNTER — Ambulatory Visit (INDEPENDENT_AMBULATORY_CARE_PROVIDER_SITE_OTHER)
Admission: RE | Admit: 2018-07-09 | Discharge: 2018-07-09 | Disposition: A | Discharge status: Home / Self Care | Payer: BLUE CROSS/BLUE SHIELD | Source: Ambulatory Visit | Attending: Internal Medicine | Admitting: Internal Medicine

## 2018-07-09 ENCOUNTER — Ambulatory Visit (INDEPENDENT_AMBULATORY_CARE_PROVIDER_SITE_OTHER): Payer: BLUE CROSS/BLUE SHIELD | Admitting: Internal Medicine

## 2018-07-09 VITALS — BP 134/84 | HR 80 | Temp 98.0°F | Resp 16 | Wt 223.0 lb

## 2018-07-09 DIAGNOSIS — R9389 Abnormal findings on diagnostic imaging of other specified body structures: Secondary | ICD-10-CM

## 2018-07-09 DIAGNOSIS — E876 Hypokalemia: Secondary | ICD-10-CM

## 2018-07-09 DIAGNOSIS — R059 Cough, unspecified: Secondary | ICD-10-CM

## 2018-07-09 DIAGNOSIS — T502X5A Adverse effect of carbonic-anhydrase inhibitors, benzothiadiazides and other diuretics, initial encounter: Secondary | ICD-10-CM

## 2018-07-09 DIAGNOSIS — I10 Essential (primary) hypertension: Secondary | ICD-10-CM | POA: Diagnosis not present

## 2018-07-09 DIAGNOSIS — R05 Cough: Secondary | ICD-10-CM

## 2018-07-09 MED ORDER — POTASSIUM CHLORIDE CRYS ER 20 MEQ PO TBCR: 20.0000 meq | EXTENDED_RELEASE_TABLET | Freq: Every day | ORAL | 1 refills | Status: DC

## 2018-07-09 MED ORDER — AZILSARTAN-CHLORTHALIDONE 40-12.5 MG PO TABS: 1.0000 | ORAL_TABLET | Freq: Every day | ORAL | 0 refills | Status: DC

## 2018-07-09 NOTE — Patient Instructions (Signed)

## 2018-07-09 NOTE — Progress Notes (Signed)
Subjective:  Patient ID: Alex [name], male    DOB: [birth year]  Age: 62 y.o. MRN: [id number]  CC: Hypertension   HPI Alex [name] presents for f/up - He tells me his blood pressure has been well controlled but he complains that over the last few weeks he has developed enlarged and tender breasts.  He also complains of a nonproductive cough for 2 weeks.  He denies chest pain, hemoptysis, fever, chills, wheezing, or shortness of breath.  Outpatient Medications Prior to Visit  Medication Sig Dispense Refill  . Cholecalciferol (VITAMIN D3) 2000 units TABS TAKE 1 TABLET BY MOUTH DAILY 90 tablet 1  . esomeprazole (NEXIUM) 40 MG capsule take 1 capsule by mouth every morning BEFORE BREAKFAST 30 capsule 11  . fexofenadine (ALLEGRA) 180 MG tablet Take 180 mg by mouth daily.      Marland Kitchen acetaminophen (TYLENOL) 325 MG tablet Take 650 mg by mouth 4 (four) times daily as needed (KNEE PAIN).    Marland Kitchen CARTIA XT 240 MG 24 hr capsule TAKE 1 CAPSULE BY MOUTH EVERY DAY 90 capsule 0  . ferrous sulfate 325 (65 FE) MG tablet 325 MG ONCE DAILY    . losartan (COZAAR) 100 MG tablet TAKE 1 TABLET BY MOUTH ONCE DAILY 90 tablet 1  . potassium chloride SA (K-DUR,KLOR-CON) 20 MEQ tablet Take 1 tablet (20 mEq total) by mouth 3 (three) times daily. (Patient taking differently: Take 20 mEq by mouth daily. ) 270 tablet 1  . spironolactone (ALDACTONE) 25 MG tablet Take 1 tablet (25 mg total) by mouth daily. 90 tablet 0  . cyanocobalamin ((VITAMIN B-12)) injection 1,000 mcg      No facility-administered medications prior to visit.     ROS Review of Systems  Constitutional: Negative for chills, diaphoresis, fatigue and fever.  HENT: Negative.  Negative for trouble swallowing and voice change.   Eyes: Negative.   Respiratory: Positive for cough. Negative for chest tightness, shortness of breath and wheezing.   Cardiovascular: Negative for chest pain, palpitations and leg swelling.  Gastrointestinal: Negative for  abdominal pain, diarrhea, nausea and vomiting.  Endocrine: Negative.   Genitourinary: Negative.  Negative for difficulty urinating, dysuria and urgency.  Musculoskeletal: Negative.  Negative for arthralgias, back pain, myalgias and neck pain.  Skin: Negative.  Negative for color change and rash.  Neurological: Negative.  Negative for dizziness, weakness and light-headedness.  Hematological: Negative for adenopathy. Does not bruise/bleed easily.  Psychiatric/Behavioral: Negative.     Objective:  BP 134/84   Pulse 80   Temp 98 F (36.7 C) (Oral)   Resp 16   Wt 223 lb (101.2 kg)   SpO2 98%   BMI 32.00 kg/m   BP Readings from Last 3 Encounters:  07/09/18 134/84  04/15/18 130/80  02/10/18 (!) 160/100    Wt Readings from Last 3 Encounters:  07/09/18 223 lb (101.2 kg)  04/15/18 222 lb 4 oz (100.8 kg)  02/10/18 216 lb 0.6 oz (98 kg)    Physical Exam  Constitutional: He is oriented to person, place, and time. He appears well-developed and well-nourished. No distress.  HENT:  Mouth/Throat: Oropharynx is clear and moist. No oropharyngeal exudate.  Eyes: Conjunctivae are normal. No scleral icterus.  Neck: Normal range of motion. Neck supple. No JVD present. No thyromegaly present.  Cardiovascular: Normal rate, regular rhythm and normal heart sounds. Exam reveals no gallop.  No murmur heard. Pulmonary/Chest: Effort normal and breath sounds normal. He has no decreased breath sounds. He has no  wheezes. He has no rhonchi. He has no rales.  There is mild, bilateral gynecomastia.  There are no masses.  The areolae and skin are normal.  The remainder the breast examination is within normal limits.  Abdominal: Soft. Normal appearance and bowel sounds are normal. He exhibits no mass. There is no hepatosplenomegaly. There is no tenderness.  Musculoskeletal: Normal range of motion. He exhibits edema. He exhibits no deformity.  Trace pitting edema BLE  Lymphadenopathy:    He has no cervical  adenopathy.  Neurological: He is alert and oriented to person, place, and time.  Skin: Skin is warm and dry. No rash noted. He is not diaphoretic.  Vitals reviewed.   Lab Results  Component Value Date   WBC 5.0 12/30/2017   HGB 16.2 12/30/2017   HCT 46.3 12/30/2017   PLT 190.0 12/30/2017   GLUCOSE 117 (H) 04/15/2018   CHOL 158 02/10/2018   TRIG 165.0 (H) 02/10/2018   HDL 39.40 02/10/2018   LDLDIRECT 146.5 12/05/2010   LDLCALC 86 02/10/2018   ALT 42 12/30/2017   AST 26 12/30/2017   NA 139 04/15/2018   K 3.1 (L) 04/15/2018   CL 102 04/15/2018   CREATININE 1.03 04/15/2018   BUN 20 04/15/2018   CO2 28 04/15/2018   TSH 2.27 02/10/2018   PSA 0.87 02/10/2018   HGBA1C 5.3 02/10/2018    No results found.  Assessment & Plan:   Alex [name] was seen today for hypertension.  Diagnoses and all orders for this visit:  Essential hypertension- His blood pressure is well controlled but he has developed gynecomastia which is most likely caused by the spironolactone.  Will discontinue spironolactone.  Will upgrade him to a more potent ARB and a low-dose thiazide diuretic.  He also has mild lower extremity edema so have asked him to stop taking the CCB. -     Basic metabolic panel; Future -     Magnesium; Future -     potassium chloride SA (K-DUR,KLOR-CON) 20 MEQ tablet; Take 1 tablet (20 mEq total) by mouth daily. -     Azilsartan-Chlorthalidone (EDARBYCLOR) 40-12.5 MG TABS; Take 1 tablet by mouth daily.  Diuretic-induced hypokalemia-I will monitor his potassium level and will increase his potassium supplement if indicated. -     Basic metabolic panel; Future -     Magnesium; Future -     potassium chloride SA (K-DUR,KLOR-CON) 20 MEQ tablet; Take 1 tablet (20 mEq total) by mouth daily.  Hypokalemia -     potassium chloride SA (K-DUR,KLOR-CON) 20 MEQ tablet; Take 1 tablet (20 mEq total) by mouth daily.  Cough- He has a nonproductive cough, a normal exam, but an abnormal chest x-ray.  I  have asked him to undergo a noncontrast CT of the chest to see if he has an atypical pneumonia, interstitial pneumonitis, malignancy, or pulmonary fibrosis. -     DG Chest 2 View; Future -     CT Chest Wo Contrast; Future  Abnormal chest x-ray- See above -     CT Chest Wo Contrast; Future   I have discontinued Pierce Crane. [place] "Mike"'s ferrous sulfate, acetaminophen, losartan, spironolactone, and CARTIA XT. I have also changed his potassium chloride SA. Additionally, I am having him start on Azilsartan-Chlorthalidone. Lastly, I am having him maintain his fexofenadine, esomeprazole, and Vitamin D3. We will stop administering cyanocobalamin.  Meds ordered this encounter  Medications  . potassium chloride SA (K-DUR,KLOR-CON) 20 MEQ tablet    Sig: Take 1 tablet (20 mEq total)  by mouth daily.    Dispense:  90 tablet    Refill:  1  . Azilsartan-Chlorthalidone (EDARBYCLOR) 40-12.5 MG TABS    Sig: Take 1 tablet by mouth daily.    Dispense:  56 tablet    Refill:  0     Follow-up: Return in about 6 weeks (around 08/20/2018).  Scarlette Calico, MD

## 2018-07-10 ENCOUNTER — Other Ambulatory Visit: Payer: Self-pay | Admitting: Internal Medicine

## 2018-07-10 ENCOUNTER — Encounter: Payer: Self-pay | Admitting: Internal Medicine

## 2018-07-10 ENCOUNTER — Ambulatory Visit (INDEPENDENT_AMBULATORY_CARE_PROVIDER_SITE_OTHER)
Admission: RE | Admit: 2018-07-10 | Discharge: 2018-07-10 | Disposition: A | Discharge status: Home / Self Care | Payer: BLUE CROSS/BLUE SHIELD | Source: Ambulatory Visit | Attending: Internal Medicine | Admitting: Internal Medicine

## 2018-07-10 DIAGNOSIS — R05 Cough: Secondary | ICD-10-CM

## 2018-07-10 DIAGNOSIS — R9389 Abnormal findings on diagnostic imaging of other specified body structures: Secondary | ICD-10-CM

## 2018-07-10 DIAGNOSIS — R059 Cough, unspecified: Secondary | ICD-10-CM

## 2018-07-10 DIAGNOSIS — R918 Other nonspecific abnormal finding of lung field: Secondary | ICD-10-CM | POA: Diagnosis not present

## 2018-07-10 LAB — BASIC METABOLIC PANEL
BUN: 21 mg/dL (ref 6–23)
CO2: 19 mEq/L (ref 19–32)
Calcium: 9.1 mg/dL (ref 8.4–10.5)
Chloride: 106 mEq/L (ref 96–112)
Creatinine, Ser: 1.07 mg/dL (ref 0.40–1.50)
GFR: 74.64 mL/min (ref >60.00)
Glucose, Bld: 121 mg/dL — ABNORMAL HIGH (ref 70–99)
Potassium: 4.1 mEq/L (ref 3.5–5.1)
Sodium: 139 mEq/L (ref 135–145)

## 2018-07-10 LAB — MAGNESIUM: Magnesium: 2 mg/dL (ref 1.5–2.5)

## 2018-07-11 ENCOUNTER — Encounter: Payer: Self-pay | Admitting: Internal Medicine

## 2018-07-15 ENCOUNTER — Encounter: Payer: Self-pay | Admitting: Adult Health

## 2018-07-15 ENCOUNTER — Ambulatory Visit (INDEPENDENT_AMBULATORY_CARE_PROVIDER_SITE_OTHER): Payer: BLUE CROSS/BLUE SHIELD | Admitting: Adult Health

## 2018-07-15 ENCOUNTER — Other Ambulatory Visit: Payer: Self-pay | Admitting: Internal Medicine

## 2018-07-15 VITALS — BP 140/84 | HR 88 | Ht 70.0 in | Wt 222.2 lb

## 2018-07-15 DIAGNOSIS — G4733 Obstructive sleep apnea (adult) (pediatric): Secondary | ICD-10-CM

## 2018-07-15 DIAGNOSIS — Z9989 Dependence on other enabling machines and devices: Secondary | ICD-10-CM | POA: Diagnosis not present

## 2018-07-15 DIAGNOSIS — E876 Hypokalemia: Secondary | ICD-10-CM

## 2018-07-15 DIAGNOSIS — T502X5A Adverse effect of carbonic-anhydrase inhibitors, benzothiadiazides and other diuretics, initial encounter: Secondary | ICD-10-CM

## 2018-07-15 DIAGNOSIS — I10 Essential (primary) hypertension: Secondary | ICD-10-CM

## 2018-07-15 NOTE — Progress Notes (Signed)
PATIENT: Alex [name] DOB: 1956-05-10  REASON FOR VISIT: follow up HISTORY FROM: patient  HISTORY OF PRESENT ILLNESS: Today 07/15/18: Alex [name] is a 62 year old male with a history of obstructive sleep apnea on CPAP.  He returns today for follow-up.  His CPAP download indicates that he uses machine 23 out of 30 days for compliance of 77%.  He uses machine greater than 4 hours 11 days for compliance of 37%.  On average he uses his machine 3 hours and 56 minutes.  His residual AHI is 1.3 on 6 to 15 cm of water with EPR 3.  His leak is in the 95th percentile is 21.3 L/min.  The patient states that some nights he wakes up and if it is close to 4 hours he will take the machine off.  He reports that if it leaks it tends to wake him up as well.  He reports that he has tried several different mask in the past.  But the nasal pillows seems to work best for him.  He returns today for an evaluation.   HISTORY 12/31/17 Alex [name] is a 62 year old male with a history of obstructive sleep apnea on CPAP.  He returns today for follow-up.  His CPAP download indicates that he uses machine 60 out of 60 days for compliance of 100%.  He uses machine greater than 4 hours 35 out of 60 days for compliance of 58%.  On average he uses his machine 4 hours and 17 minutes.  His residual AHI is 1.6 on a minimum pressure of 6 cm of water and maximum pressure of 15 cm of water with EPR of 3.  His leak in the 95th percentile is 20.8 L/min.  The patient reports that the mask sometimes slipped off his face and it has to be adjusted.  He also reports that there are times that he wakes up to go to the bathroom and he will not put the mask back on when he lays down.  The patient reports that before he has seen some benefit.  He returns today for follow-up.  REVIEW OF SYSTEMS: Out of a complete 14 system review of symptoms, the patient complains only of the following symptoms, and all other reviewed systems are  negative.  Epworth sleepiness score 4   ALLERGIES: Allergies  Allergen Reactions  . Diltiazem Other (See Comments)    Leg edema  . Spironolactone Other (See Comments)    gynecomastia    HOME MEDICATIONS: Outpatient Medications Prior to Visit  Medication Sig Dispense Refill  . Azilsartan-Chlorthalidone (EDARBYCLOR) 40-12.5 MG TABS Take 1 tablet by mouth daily. 56 tablet 0  . Cholecalciferol (VITAMIN D3) 2000 units TABS TAKE 1 TABLET BY MOUTH DAILY 90 tablet 1  . esomeprazole (NEXIUM) 40 MG capsule take 1 capsule by mouth every morning BEFORE BREAKFAST 30 capsule 11  . fexofenadine (ALLEGRA) 180 MG tablet Take 180 mg by mouth daily.      . potassium chloride SA (K-DUR,KLOR-CON) 20 MEQ tablet Take 1 tablet (20 mEq total) by mouth daily. 90 tablet 1   No facility-administered medications prior to visit.     PAST MEDICAL HISTORY: Past Medical History:  Diagnosis Date  . Achilles tendinitis    PMH of  . B12 deficiency   . Barrett's esophagus   . Diverticulosis of colon (without mention of hemorrhage) 2010  . Fatty liver 10-16-2011   [place]  . GERD (gastroesophageal reflux disease)   . Gilbert syndrome   . Hiatal  hernia   . 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 Hyperlipidemia   . Hypertension   . Nonspecific elevation of levels of transaminase or lactic acid dehydrogenase (LDH)    PMH of  . OSA on CPAP     PAST SURGICAL HISTORY: Past Surgical History:  Procedure Laterality Date  . COLONOSCOPY  2010   negative  . KNEE ARTHROSCOPY     R 2015 & L 2016; [facility]  . REPLACEMENT TOTAL KNEE Right 01/15/2018  . REPLACEMENT TOTAL KNEE Left 02/19/2018  . UPPER GASTROINTESTINAL ENDOSCOPY     Dr Sharlett Iles; multiple  snce 1992  . VASECTOMY    . WISDOM TOOTH EXTRACTION      FAMILY HISTORY: Family History  Problem Relation Age of Onset  . Esophageal cancer Father   . Stomach cancer Father   . Hypertension Father   . Heart disease Father        CBAG @ 33  . Hypertension Mother   .  Heart attack Mother 70  . Cancer Mother   . Hypertension Sister   . Hypertension Sister   . Hypertension Sister   . Stroke Neg Hx   . Diabetes Neg Hx   . Early death Neg Hx   . Hyperlipidemia Neg Hx   . Kidney disease Neg Hx   . Alcohol abuse Neg Hx     SOCIAL HISTORY: Social History   Socioeconomic History  . Marital status: Married    Spouse name: Not on file  . Number of children: 2  . Years of education: 52  . Highest education level: Not on file  Occupational History  . Occupation: dry cleaners  Social Needs  . Financial resource strain: Not on file  . Food insecurity:    Worry: Not on file    Inability: Not on file  . Transportation needs:    Medical: Not on file    Non-medical: Not on file  Tobacco Use  . Smoking status: Never Smoker  . Smokeless tobacco: Never Used  Substance and Sexual Activity  . Alcohol use: No  . Drug use: No  . Sexual activity: Yes  Lifestyle  . Physical activity:    Days per week: Not on file    Minutes per session: Not on file  . Stress: Not on file  Relationships  . Social connections:    Talks on phone: Not on file    Gets together: Not on file    Attends religious service: Not on file    Active member of club or organization: Not on file    Attends meetings of clubs or organizations: Not on file    Relationship status: Not on file  . Intimate partner violence:    Fear of current or ex partner: Not on file    Emotionally abused: Not on file    Physically abused: Not on file    Forced sexual activity: Not on file  Other Topics Concern  . Not on file  Social History Narrative   Caffeine: Drinks 2 sodas a day      PHYSICAL EXAM  Vitals:   07/15/18 1421  BP: 140/84  Pulse: 88  Weight: 222 lb 3.2 oz (100.8 kg)  Height: 5\' 10"  (1.778 m)   Body mass index is 31.88 kg/m.  Generalized: Well developed, in no acute distress   Neurological examination  Mentation: Alert oriented to time, place, history taking.  Follows all commands speech and language fluent Cranial nerve II-XII:  Extraocular movements were  full, visual field were full on confrontational test. Facial sensation and strength were normal. Uvula tongue midline. Head turning and shoulder shrug  were normal and symmetric. Motor: The motor testing reveals 5 over 5 strength of all 4 extremities. Good symmetric motor tone is noted throughout. Neck circumference 17 inches, Mallampati 4+ Sensory: Sensory testing is intact to soft touch on all 4 extremities. No evidence of extinction is noted.  Coordination: Cerebellar testing reveals good finger-nose-finger and heel-to-shin bilaterally.  Gait and station: Gait is normal. DIAGNOSTIC DATA (LABS, IMAGING, TESTING) - I reviewed patient records, labs, notes, testing and imaging myself where available.  Lab Results  Component Value Date   WBC 5.0 12/30/2017   HGB 16.2 12/30/2017   HCT 46.3 12/30/2017   MCV 94.0 12/30/2017   PLT 190.0 12/30/2017      Component Value Date/Time   NA 139 07/09/2018 1703   K 4.1 07/09/2018 1703   CL 106 07/09/2018 1703   CO2 19 07/09/2018 1703   GLUCOSE 121 (H) 07/09/2018 1703   BUN 21 07/09/2018 1703   CREATININE 1.07 07/09/2018 1703   CALCIUM 9.1 07/09/2018 1703   PROT 7.0 12/30/2017 1613   ALBUMIN 4.3 12/30/2017 1613   AST 26 12/30/2017 1613   ALT 42 12/30/2017 1613   ALKPHOS 42 12/30/2017 1613   BILITOT 0.7 12/30/2017 1613   GFRNONAA 83.24 11/15/2009 0939   GFRAA 114 11/16/2008 0000   Lab Results  Component Value Date   CHOL 158 02/10/2018   HDL 39.40 02/10/2018   LDLCALC 86 02/10/2018   LDLDIRECT 146.5 12/05/2010   TRIG 165.0 (H) 02/10/2018   CHOLHDL 4 02/10/2018   Lab Results  Component Value Date   HGBA1C 5.3 02/10/2018   Lab Results  Component Value Date   VITAMINB12 398 01/17/2016   Lab Results  Component Value Date   TSH 2.27 02/10/2018      ASSESSMENT AND PLAN 62 y.o. 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 here with:  1.  Obstructive sleep apnea on CPAP  The patient CPAP download shows suboptimal compliance with good treatment of his apnea.  He is encouraged to try to keep his mask on throughout the night.  He is also encouraged to try to use machine nightly.  He is advised that if his symptoms worsen or he develops new symptoms he should let us know.  He will follow-up in 6 months or sooner if needed.   I spent 15 minutes with the patient. 50% of this time was spent reviewing his CPAP download   Ward Givens, MSN, NP-C 07/15/2018, 2:25 PM [facility] [address], [place], [place] [address] [phone]

## 2018-07-15 NOTE — Patient Instructions (Addendum)
Your Plan:  Continue using CPAP nightly and >4 hours each night If your symptoms worsen or you develop new symptoms please let us know.   Thank you for coming to see us at [facility]. I hope we have been able to provide you high quality care today.  You may receive a patient satisfaction survey over the next few weeks. We would appreciate your feedback and comments so that we may continue to improve ourselves and the health of our patients.  

## 2018-07-17 ENCOUNTER — Telehealth: Payer: Self-pay | Admitting: Internal Medicine

## 2018-07-17 NOTE — Telephone Encounter (Signed)
Pt is requesting rx for cough medication if you feel it is appropriate.

## 2018-07-17 NOTE — Telephone Encounter (Signed)
Copied from [place] [id number]. Topic: General - Other >> Jul 17, 2018 11:33 AM Lennox Solders wrote: Reason for CRM: Pt saw dr Ronnald Ramp on 07-09-18 and had ct chest scan on 07-10-18 . Pt still has cough and would like cough med sent to [facility] [address]

## 2018-07-18 ENCOUNTER — Other Ambulatory Visit: Payer: Self-pay | Admitting: Internal Medicine

## 2018-07-18 DIAGNOSIS — R05 Cough: Secondary | ICD-10-CM

## 2018-07-18 DIAGNOSIS — R059 Cough, unspecified: Secondary | ICD-10-CM

## 2018-07-18 MED ORDER — HYDROCODONE-HOMATROPINE 5-1.5 MG/5ML PO SYRP: 5.0000 mL | ORAL_SOLUTION | Freq: Three times a day (TID) | ORAL | 0 refills | Status: DC | PRN

## 2018-07-29 ENCOUNTER — Ambulatory Visit (INDEPENDENT_AMBULATORY_CARE_PROVIDER_SITE_OTHER): Payer: BLUE CROSS/BLUE SHIELD

## 2018-07-29 DIAGNOSIS — E538 Deficiency of other specified B group vitamins: Secondary | ICD-10-CM

## 2018-07-29 MED ORDER — CYANOCOBALAMIN 1000 MCG/ML IJ SOLN
1000.0000 ug | Freq: Once | INTRAMUSCULAR | Status: AC
  Administered 2018-07-29: 1000 ug via INTRAMUSCULAR

## 2018-08-13 ENCOUNTER — Other Ambulatory Visit: Payer: Self-pay | Admitting: Internal Medicine

## 2018-08-13 DIAGNOSIS — I10 Essential (primary) hypertension: Secondary | ICD-10-CM

## 2018-08-20 ENCOUNTER — Ambulatory Visit: Payer: Self-pay | Admitting: Internal Medicine

## 2018-09-01 ENCOUNTER — Telehealth: Payer: Self-pay | Admitting: Internal Medicine

## 2018-09-01 NOTE — Telephone Encounter (Signed)
Copied from [place] [id number]. Topic: General - Other >> Sep 01, 2018 11:06 AM Lennox Solders wrote: Reason for CRM: pt is calling and had samples of edarbyclor 40 mg and now would like new rx send to [facility] on [address] or more samples if md want to wait until after pt  appt 09-09-18. Pt will be at office tomorrow for b12 injection

## 2018-09-01 NOTE — Telephone Encounter (Signed)
Pt informed samples are up front for pick up.

## 2018-09-02 ENCOUNTER — Ambulatory Visit (INDEPENDENT_AMBULATORY_CARE_PROVIDER_SITE_OTHER): Payer: BLUE CROSS/BLUE SHIELD | Admitting: Emergency Medicine

## 2018-09-02 DIAGNOSIS — E538 Deficiency of other specified B group vitamins: Secondary | ICD-10-CM | POA: Diagnosis not present

## 2018-09-02 MED ORDER — CYANOCOBALAMIN 1000 MCG/ML IJ SOLN
1000.0000 ug | Freq: Once | INTRAMUSCULAR | Status: AC
  Administered 2018-09-02: 1000 ug via INTRAMUSCULAR

## 2018-09-02 NOTE — Progress Notes (Signed)
I have reviewed and agree.

## 2018-09-09 ENCOUNTER — Telehealth: Payer: Self-pay

## 2018-09-09 ENCOUNTER — Other Ambulatory Visit (INDEPENDENT_AMBULATORY_CARE_PROVIDER_SITE_OTHER): Payer: BLUE CROSS/BLUE SHIELD

## 2018-09-09 ENCOUNTER — Ambulatory Visit (INDEPENDENT_AMBULATORY_CARE_PROVIDER_SITE_OTHER): Payer: BLUE CROSS/BLUE SHIELD | Admitting: Internal Medicine

## 2018-09-09 ENCOUNTER — Encounter: Payer: Self-pay | Admitting: Internal Medicine

## 2018-09-09 VITALS — BP 136/84 | HR 86 | Temp 97.7°F | Resp 16 | Ht 70.0 in | Wt 226.5 lb

## 2018-09-09 DIAGNOSIS — I10 Essential (primary) hypertension: Secondary | ICD-10-CM

## 2018-09-09 DIAGNOSIS — E876 Hypokalemia: Secondary | ICD-10-CM

## 2018-09-09 LAB — BASIC METABOLIC PANEL
BUN: 21 mg/dL (ref 6–23)
CO2: 27 mEq/L (ref 19–32)
Calcium: 9.3 mg/dL (ref 8.4–10.5)
Chloride: 105 mEq/L (ref 96–112)
Creatinine, Ser: 0.95 mg/dL (ref 0.40–1.50)
GFR: 85.58 mL/min (ref >60.00)
Glucose, Bld: 142 mg/dL — ABNORMAL HIGH (ref 70–99)
Potassium: 3.6 mEq/L (ref 3.5–5.1)
Sodium: 139 mEq/L (ref 135–145)

## 2018-09-09 MED ORDER — AZILSARTAN-CHLORTHALIDONE 40-12.5 MG PO TABS: 1.0000 | ORAL_TABLET | Freq: Every day | ORAL | 1 refills | Status: DC

## 2018-09-09 NOTE — Telephone Encounter (Signed)
Received notice from [facility] that since pt has been non-compliant with his cpap (>4hrs nightly of use is the requirement) they will need to pick his machine up and to be eligible for another cpap, he will need to repeat the process over (F2F, sleep study, new order.)  I called pt to discuss. No answer, left a message asking him to call me back.

## 2018-09-09 NOTE — Progress Notes (Signed)
Subjective:  Patient ID: Alex [name], male    DOB: April 18, 1956  Age: 62 y.o. MRN: [id number]  CC: Hypertension   HPI IYAD DEROO presents for a BP check - He tells me his blood pressure has been well controlled.  He is tolerating the current regimen well.  He feels well today and offers no complaints.  Outpatient Medications Prior to Visit  Medication Sig Dispense Refill  . Cholecalciferol (VITAMIN D3) 2000 units TABS TAKE 1 TABLET BY MOUTH DAILY 90 tablet 1  . esomeprazole (NEXIUM) 40 MG capsule take 1 capsule by mouth every morning BEFORE BREAKFAST 30 capsule 11  . fexofenadine (ALLEGRA) 180 MG tablet Take 180 mg by mouth daily.      . meloxicam (MOBIC) 15 MG tablet Take 15 mg by mouth daily.    . potassium chloride SA (K-DUR,KLOR-CON) 20 MEQ tablet Take 1 tablet (20 mEq total) by mouth daily. 90 tablet 1  . Azilsartan-Chlorthalidone (EDARBYCLOR) 40-12.5 MG TABS Take 1 tablet by mouth daily. 56 tablet 0  . HYDROcodone-homatropine (HYCODAN) 5-1.5 MG/5ML syrup Take 5 mLs by mouth every 8 (eight) hours as needed for cough. 120 mL 0   No facility-administered medications prior to visit.     ROS Review of Systems  Constitutional: Negative for diaphoresis and fatigue.  HENT: Negative.   Eyes: Negative for visual disturbance.  Respiratory: Negative for cough, chest tightness, shortness of breath and wheezing.   Cardiovascular: Negative for chest pain, palpitations and leg swelling.  Gastrointestinal: Negative for abdominal pain, diarrhea, nausea and vomiting.  Genitourinary: Negative.  Negative for difficulty urinating.  Musculoskeletal: Negative.   Skin: Negative.   Neurological: Negative for dizziness, weakness, light-headedness and headaches.  Hematological: Negative for adenopathy. Does not bruise/bleed easily.  Psychiatric/Behavioral: Negative.     Objective:  BP 136/84 (BP Location: Left Arm, Patient Position: Sitting, Cuff Size: Large)   Pulse 86   Temp 97.7  F (36.5 C) (Oral)   Resp 16   Ht 5\' 10"  (1.778 m)   Wt 226 lb 8 oz (102.7 kg)   SpO2 96%   BMI 32.50 kg/m   BP Readings from Last 3 Encounters:  09/09/18 136/84  07/15/18 140/84  07/09/18 134/84    Wt Readings from Last 3 Encounters:  09/09/18 226 lb 8 oz (102.7 kg)  07/15/18 222 lb 3.2 oz (100.8 kg)  07/09/18 223 lb (101.2 kg)    Physical Exam  Constitutional: He is oriented to person, place, and time. No distress.  HENT:  Mouth/Throat: Oropharynx is clear and moist. No oropharyngeal exudate.  Eyes: Conjunctivae are normal. No scleral icterus.  Neck: Normal range of motion. Neck supple. No JVD present. No thyromegaly present.  Cardiovascular: Normal rate, regular rhythm and normal heart sounds. Exam reveals no gallop.  No murmur heard. Pulmonary/Chest: Effort normal and breath sounds normal. No respiratory distress. He has no wheezes. He has no rales.  Abdominal: Soft. Bowel sounds are normal. He exhibits no mass. There is no hepatosplenomegaly. There is no tenderness.  Musculoskeletal: Normal range of motion. He exhibits no edema, tenderness or deformity.  Lymphadenopathy:    He has no cervical adenopathy.  Neurological: He is alert and oriented to person, place, and time.  Skin: Skin is warm and dry. No rash noted. He is not diaphoretic.  Vitals reviewed.   Lab Results  Component Value Date   WBC 5.0 12/30/2017   HGB 16.2 12/30/2017   HCT 46.3 12/30/2017   PLT 190.0 12/30/2017  GLUCOSE 142 (H) 09/09/2018   CHOL 158 02/10/2018   TRIG 165.0 (H) 02/10/2018   HDL 39.40 02/10/2018   LDLDIRECT 146.5 12/05/2010   LDLCALC 86 02/10/2018   ALT 42 12/30/2017   AST 26 12/30/2017   NA 139 09/09/2018   K 3.6 09/09/2018   CL 105 09/09/2018   CREATININE 0.95 09/09/2018   BUN 21 09/09/2018   CO2 27 09/09/2018   TSH 2.27 02/10/2018   PSA 0.87 02/10/2018   HGBA1C 5.3 02/10/2018    Ct Chest Wo Contrast  Result Date: 07/10/2018 CLINICAL DATA:  Abnormal chest x-ray.   Persistent cough 3 weeks. EXAM: CT CHEST WITHOUT CONTRAST TECHNIQUE: Multidetector CT imaging of the chest was performed following the standard protocol without IV contrast. COMPARISON:  Chest x-ray 07/09/2018 FINDINGS: Cardiovascular: Heart is normal size. There is calcified plaque over the left main and 3 vessel coronary arteries. Mild calcified plaque over the thoracic aorta. Mediastinum/Nodes: No mediastinal or hilar adenopathy. Remaining mediastinal structures are notable only for mild debris within the mid esophagus which may be due to dysmotility or reflux. Lungs/Pleura: Lungs are well inflated without focal airspace consolidation or effusion. There is a 4 mm peripheral nodule over the lateral right lower lobe as well as 4 mm subpleural nodule over the lateral left lower lobe. Airways are normal. Upper Abdomen: Colonic diverticulosis. No acute findings. Calcified plaque over the abdominal aorta. Musculoskeletal: Degenerative change of the spine with minimal anterior wedging of several adjacent midthoracic vertebral bodies likely chronic. IMPRESSION: No acute cardiopulmonary disease. 2 small 4 mm nodules over the lower lobes as described. Recommend follow-up noncontrast chest CT 1 year. This recommendation follows the consensus statement: Guidelines for Management of Small Pulmonary Nodules Detected on CT Scans: A Statement from the [employer] as published in Radiology 2005; 237:395-400. Online at: https://www.arnold.com/. Aortic Atherosclerosis (ICD10-I70.0). Left main and 3 vessel atherosclerotic coronary artery disease. Mild debris within the mid esophagus likely due to dysmotility or reflux. Colonic diverticulosis. Mild anterior wedging of several adjacent midthoracic vertebral bodies likely chronic. Electronically Signed   By: Marin Olp M.D.   On: 07/10/2018 15:30    Assessment & Plan:   Onie was seen today for hypertension.  Diagnoses and all orders  for this visit:  Hypokalemia- His potassium level is barely in the normal range.  Will continue the current potassium supplementation. -     Basic metabolic panel; Future  Essential hypertension- His blood pressure is well controlled.  Electrolytes and renal function are normal.  Will continue the current combination of an ARB and thiazide diuretic. -     Azilsartan-Chlorthalidone (EDARBYCLOR) 40-12.5 MG TABS; Take 1 tablet by mouth daily. -     Basic metabolic panel; Future   I have discontinued Pierce Crane. Mackert "Mike"'s HYDROcodone-homatropine. I am also having him maintain his fexofenadine, esomeprazole, Vitamin D3, potassium chloride SA, meloxicam, and Azilsartan-Chlorthalidone.  Meds ordered this encounter  Medications  . Azilsartan-Chlorthalidone (EDARBYCLOR) 40-12.5 MG TABS    Sig: Take 1 tablet by mouth daily.    Dispense:  90 tablet    Refill:  1     Follow-up: Return in about 6 months (around 03/10/2019).  Scarlette Calico, MD

## 2018-09-09 NOTE — Patient Instructions (Signed)

## 2018-09-10 ENCOUNTER — Other Ambulatory Visit: Payer: Self-pay | Admitting: Internal Medicine

## 2018-09-10 ENCOUNTER — Encounter: Payer: Self-pay | Admitting: Internal Medicine

## 2018-09-10 DIAGNOSIS — I10 Essential (primary) hypertension: Secondary | ICD-10-CM

## 2018-09-10 NOTE — Telephone Encounter (Signed)
Received this notice from [employer]: "So on this patient we are going to fight for him to keep his machine. However we will need to meet compliance every 90 days per his insurance to be able to get his supplies. We are going to reach out to the patient so he is aware."

## 2018-09-10 NOTE — Telephone Encounter (Signed)
Pt called me back and I explained [employer]'s message. Pt reports that he has not been contacted by [facility] regarding this and did not realize that he was non complaint. He is asking for a grace period to increase his compliance. He is reluctant to repeat the process over again. I will ask [facility] to contact pt. Pt will then let me know of his decision to proceed with cpap or discuss alternative txs for osa.

## 2018-09-30 DIAGNOSIS — H11823 Conjunctivochalasis, bilateral: Secondary | ICD-10-CM | POA: Diagnosis not present

## 2018-10-01 LAB — HM DIABETES EYE EXAM

## 2018-10-07 ENCOUNTER — Ambulatory Visit (INDEPENDENT_AMBULATORY_CARE_PROVIDER_SITE_OTHER): Payer: BLUE CROSS/BLUE SHIELD

## 2018-10-07 DIAGNOSIS — E538 Deficiency of other specified B group vitamins: Secondary | ICD-10-CM | POA: Diagnosis not present

## 2018-10-07 MED ORDER — CYANOCOBALAMIN 1000 MCG/ML IJ SOLN
1000.0000 ug | Freq: Once | INTRAMUSCULAR | Status: AC
  Administered 2018-10-07: 1000 ug via INTRAMUSCULAR

## 2018-10-07 NOTE — Progress Notes (Signed)
I have reviewed and agree.

## 2018-10-24 IMAGING — DX DG CHEST 2V
2 series · 2 of 2 positions shown · non-contrast
Comparison: None.

CLINICAL DATA: Cough for 2 weeks

EXAM:
CHEST - 2 VIEW

[chest pa]
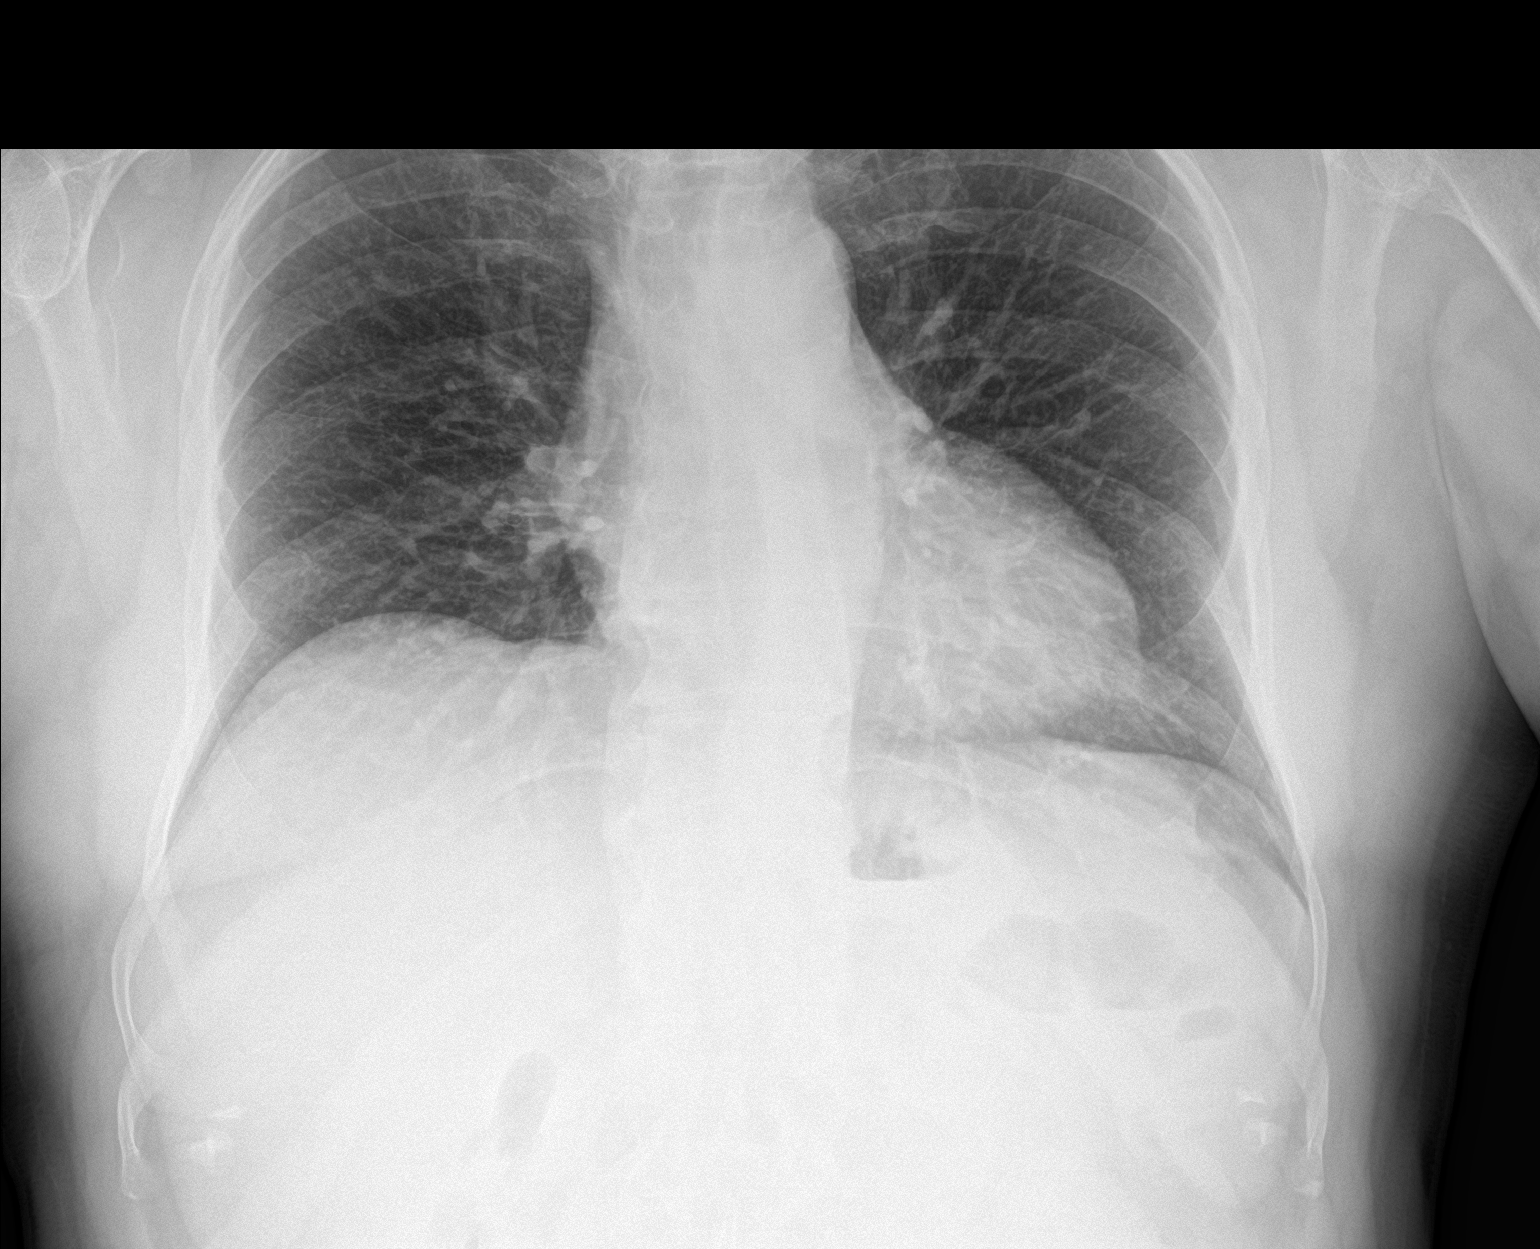

[chest lat]
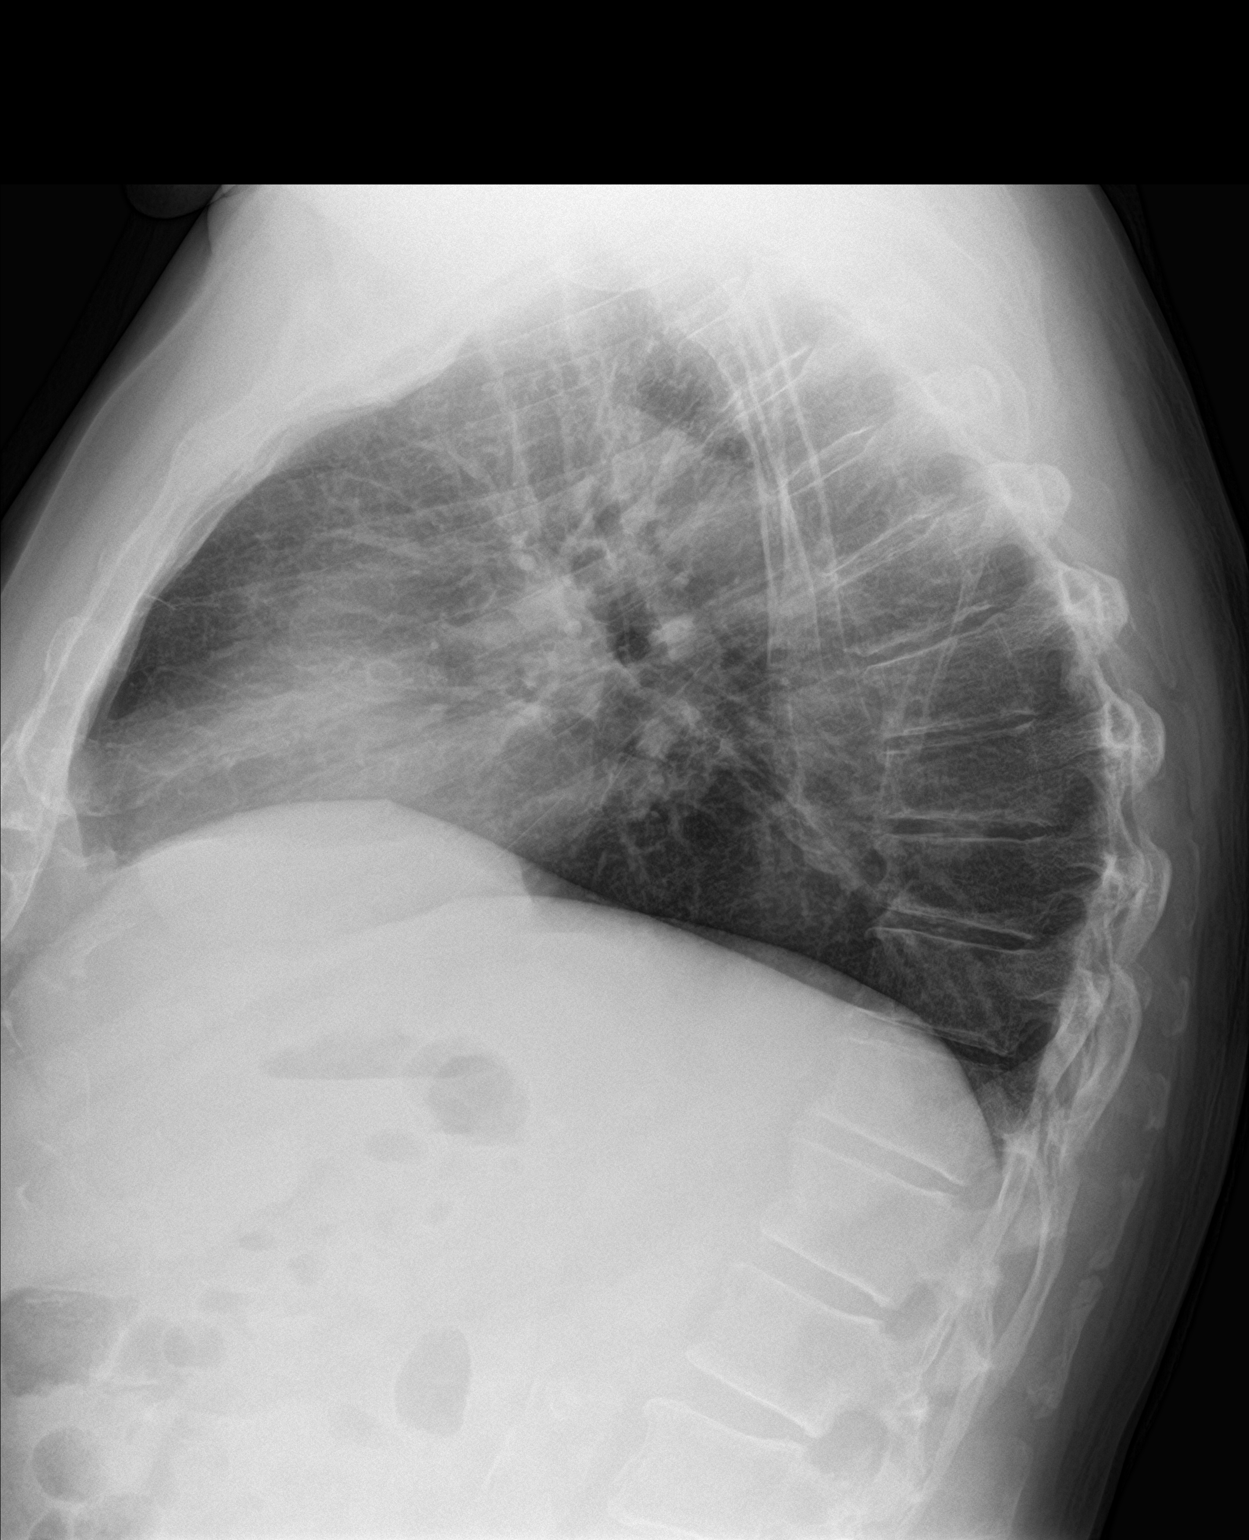

[2 of 2 positions shown; findings below may reference images not displayed]

FINDINGS: Normal cardiac and mediastinal contours. Low lung volumes. Bibasilar
heterogeneous opacities. No pleural effusion or pneumothorax.
IMPRESSION: Bibasilar heterogeneous opacities favored to represent atelectasis

## 2018-11-05 DIAGNOSIS — G4733 Obstructive sleep apnea (adult) (pediatric): Secondary | ICD-10-CM | POA: Diagnosis not present

## 2018-11-11 ENCOUNTER — Ambulatory Visit (INDEPENDENT_AMBULATORY_CARE_PROVIDER_SITE_OTHER): Payer: BLUE CROSS/BLUE SHIELD | Admitting: Emergency Medicine

## 2018-11-11 DIAGNOSIS — E538 Deficiency of other specified B group vitamins: Secondary | ICD-10-CM

## 2018-11-11 MED ORDER — CYANOCOBALAMIN 1000 MCG/ML IJ SOLN
1000.0000 ug | Freq: Once | INTRAMUSCULAR | Status: AC
  Administered 2018-11-11: 1000 ug via INTRAMUSCULAR

## 2018-11-11 NOTE — Progress Notes (Signed)
I have reviewed and agree.

## 2018-11-25 ENCOUNTER — Ambulatory Visit: Payer: Self-pay | Admitting: Internal Medicine

## 2018-11-26 ENCOUNTER — Other Ambulatory Visit: Payer: Self-pay | Admitting: Internal Medicine

## 2018-12-08 ENCOUNTER — Encounter: Payer: Self-pay | Admitting: Internal Medicine

## 2018-12-08 ENCOUNTER — Ambulatory Visit (INDEPENDENT_AMBULATORY_CARE_PROVIDER_SITE_OTHER): Payer: BLUE CROSS/BLUE SHIELD | Admitting: Internal Medicine

## 2018-12-08 VITALS — BP 140/88 | HR 80 | Ht 70.0 in | Wt 226.0 lb

## 2018-12-08 DIAGNOSIS — K219 Gastro-esophageal reflux disease without esophagitis: Secondary | ICD-10-CM | POA: Diagnosis not present

## 2018-12-08 DIAGNOSIS — K5730 Diverticulosis of large intestine without perforation or abscess without bleeding: Secondary | ICD-10-CM | POA: Diagnosis not present

## 2018-12-08 DIAGNOSIS — K2270 Barrett's esophagus without dysplasia: Secondary | ICD-10-CM | POA: Diagnosis not present

## 2018-12-08 MED ORDER — ESOMEPRAZOLE MAGNESIUM 40 MG PO CPDR: 40.0000 mg | DELAYED_RELEASE_CAPSULE | Freq: Every day | ORAL | 11 refills | Status: DC

## 2018-12-08 NOTE — Progress Notes (Signed)
HISTORY OF PRESENT ILLNESS:  Alex [name] is a 63 y.o. 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  He was last evaluated in this office January 2019.  His last upper endoscopy was performed October 2015.  He was found to have short segment nondysplastic Barrett's esophagus without other abnormalities.  He has been on chronic PPI therapy in the form of Nexium 40 mg daily.  Patient tells me that he is doing well on Nexium once daily.  No breakthrough reflux symptoms.  No dysphasia.  His GI review of systems is otherwise negative.  He did have both knees replaced last year.  He recovered nicely.  His last colonoscopy was performed April 2010 with Dr. Sharlett Iles.  He was found to have moderate diverticulosis.  Otherwise negative.  Follow-up in 10 years recommended.  Patient is aware that he is due for follow-up screening colonoscopy and surveillance upper endoscopy this year.  REVIEW OF SYSTEMS:  All non-GI ROS negative unless otherwise stated in the HPI   Past Medical History:  Diagnosis Date  . Achilles tendinitis    PMH of  . B12 deficiency   . Barrett's esophagus   . Diverticulosis of colon (without mention of hemorrhage) 2010  . Fatty liver 10-16-2011   [place]  . GERD (gastroesophageal reflux disease)   . Gilbert syndrome   . Hiatal hernia   . 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 Hyperlipidemia   . Hypertension   . Nonspecific elevation of levels of transaminase or lactic acid dehydrogenase (LDH)    PMH of  . OSA on CPAP     Past Surgical History:  Procedure Laterality Date  . COLONOSCOPY  2010   negative  . KNEE ARTHROSCOPY     R 2015 & L 2016; [facility]  . REPLACEMENT TOTAL KNEE Right 01/15/2018  . REPLACEMENT TOTAL KNEE Left 02/19/2018  . UPPER GASTROINTESTINAL ENDOSCOPY     Dr Sharlett Iles; multiple  snce 1992  . VASECTOMY    . WISDOM TOOTH EXTRACTION      Social  History Alex [name]  reports that he has never smoked. He has never used smokeless tobacco. He reports that he does not drink alcohol or use drugs.  family history includes Cancer in his mother; Esophageal cancer in his father; Heart attack (age of onset: 40) in his mother; Heart disease in his father; Hypertension in his father, mother, sister, sister, and sister; Stomach cancer in his father.  Allergies  Allergen Reactions  . Diltiazem Other (See Comments)    Leg edema  . Spironolactone Other (See Comments)    gynecomastia       PHYSICAL EXAMINATION: Vital signs: BP 140/88   Pulse 80   Ht 5\' 10"  (1.778 m)   Wt 226 lb (102.5 kg)   BMI 32.43 kg/m   Constitutional: generally well-appearing, no acute distress Psychiatric: alert and oriented x3, cooperative Eyes: extraocular movements intact, anicteric, conjunctiva pink Mouth: oral pharynx moist, no lesions Neck: supple no lymphadenopathy Cardiovascular: heart regular rate and rhythm, no murmur Lungs: clear to auscultation bilaterally Abdomen: soft, nontender, nondistended, no obvious ascites, no peritoneal signs, normal bowel sounds, no organomegaly Rectal: Deferred to colonoscopy Extremities: no lower extremity edema bilaterally Skin: no lesions on visible extremities Neuro: No focal deficits.  Cranial nerves intact  ASSESSMENT:  1.  GERD.  Managed nicely with Nexium daily 2.  Barrett's esophagus.  Last examination October 2015 with nondysplastic short  segment Barrett's esophagus.  Due for surveillance this year 3.  Colon cancer screening up-to-date.  Diverticulosis on colonoscopy 2010.  Due for follow-up screening this year   PLAN:  1.  Reflux precautions 2.  Continue Nexium 40 mg daily.  Prescription refilled 3.  Schedule surveillance endoscopy this year.  The patient will contact the office per his wish. 4.  Schedule screening colonoscopy.  The patient will contact the office per his wish.  I recommended that  he undergo both examinations the same day for his convenience and cost purposes. 5.  Interval follow-up as needed.  Routine and ongoing general medical care with Dr. Ronnald Ramp

## 2018-12-08 NOTE — Patient Instructions (Signed)
We have sent the following medications to your pharmacy for you to pick up at your convenience:  Nexium   Call us when you are ready to schedule your procedures

## 2018-12-14 ENCOUNTER — Other Ambulatory Visit: Payer: Self-pay | Admitting: Internal Medicine

## 2018-12-14 DIAGNOSIS — E559 Vitamin D deficiency, unspecified: Secondary | ICD-10-CM

## 2018-12-16 ENCOUNTER — Ambulatory Visit (INDEPENDENT_AMBULATORY_CARE_PROVIDER_SITE_OTHER): Payer: BLUE CROSS/BLUE SHIELD

## 2018-12-16 DIAGNOSIS — E538 Deficiency of other specified B group vitamins: Secondary | ICD-10-CM | POA: Diagnosis not present

## 2018-12-16 DIAGNOSIS — Z471 Aftercare following joint replacement surgery: Secondary | ICD-10-CM | POA: Diagnosis not present

## 2018-12-16 DIAGNOSIS — Z96653 Presence of artificial knee joint, bilateral: Secondary | ICD-10-CM | POA: Diagnosis not present

## 2018-12-16 MED ORDER — CYANOCOBALAMIN 1000 MCG/ML IJ SOLN
1000.0000 ug | Freq: Once | INTRAMUSCULAR | Status: AC
  Administered 2018-12-16: 1000 ug via INTRAMUSCULAR

## 2018-12-16 NOTE — Progress Notes (Signed)
I have reviewed and agree.

## 2018-12-24 ENCOUNTER — Other Ambulatory Visit: Payer: Self-pay | Admitting: Internal Medicine

## 2018-12-24 DIAGNOSIS — T502X5A Adverse effect of carbonic-anhydrase inhibitors, benzothiadiazides and other diuretics, initial encounter: Secondary | ICD-10-CM

## 2018-12-24 DIAGNOSIS — I10 Essential (primary) hypertension: Secondary | ICD-10-CM

## 2018-12-24 DIAGNOSIS — E876 Hypokalemia: Secondary | ICD-10-CM

## 2019-01-21 ENCOUNTER — Ambulatory Visit: Payer: Self-pay

## 2019-01-22 ENCOUNTER — Ambulatory Visit: Payer: Self-pay

## 2019-02-17 ENCOUNTER — Encounter: Payer: Self-pay | Admitting: Internal Medicine

## 2019-02-17 ENCOUNTER — Ambulatory Visit: Payer: Self-pay

## 2019-03-06 ENCOUNTER — Other Ambulatory Visit: Payer: Self-pay | Admitting: Internal Medicine

## 2019-03-06 DIAGNOSIS — I10 Essential (primary) hypertension: Secondary | ICD-10-CM

## 2019-03-15 ENCOUNTER — Other Ambulatory Visit: Payer: Self-pay | Admitting: Internal Medicine

## 2019-03-25 ENCOUNTER — Other Ambulatory Visit: Payer: Self-pay

## 2019-03-25 ENCOUNTER — Ambulatory Visit (INDEPENDENT_AMBULATORY_CARE_PROVIDER_SITE_OTHER): Payer: BLUE CROSS/BLUE SHIELD | Admitting: Internal Medicine

## 2019-03-25 ENCOUNTER — Encounter: Payer: Self-pay | Admitting: Internal Medicine

## 2019-03-25 ENCOUNTER — Other Ambulatory Visit (INDEPENDENT_AMBULATORY_CARE_PROVIDER_SITE_OTHER): Payer: BLUE CROSS/BLUE SHIELD

## 2019-03-25 VITALS — BP 162/108 | HR 85 | Temp 97.7°F | Resp 16 | Ht 70.0 in | Wt 233.0 lb

## 2019-03-25 DIAGNOSIS — E538 Deficiency of other specified B group vitamins: Secondary | ICD-10-CM

## 2019-03-25 DIAGNOSIS — I10 Essential (primary) hypertension: Secondary | ICD-10-CM

## 2019-03-25 DIAGNOSIS — E118 Type 2 diabetes mellitus with unspecified complications: Secondary | ICD-10-CM | POA: Insufficient documentation

## 2019-03-25 DIAGNOSIS — R739 Hyperglycemia, unspecified: Secondary | ICD-10-CM

## 2019-03-25 DIAGNOSIS — Z0000 Encounter for general adult medical examination without abnormal findings: Secondary | ICD-10-CM

## 2019-03-25 DIAGNOSIS — R195 Other fecal abnormalities: Secondary | ICD-10-CM

## 2019-03-25 DIAGNOSIS — Z125 Encounter for screening for malignant neoplasm of prostate: Secondary | ICD-10-CM | POA: Diagnosis not present

## 2019-03-25 DIAGNOSIS — E559 Vitamin D deficiency, unspecified: Secondary | ICD-10-CM

## 2019-03-25 DIAGNOSIS — K7581 Nonalcoholic steatohepatitis (NASH): Secondary | ICD-10-CM

## 2019-03-25 LAB — CBC WITH DIFFERENTIAL/PLATELET
Basophils Absolute: 0 10*3/uL (ref 0.0–0.1)
Basophils Relative: 1.3 % (ref 0.0–3.0)
Eosinophils Absolute: 0.1 10*3/uL (ref 0.0–0.7)
Eosinophils Relative: 2.7 % (ref 0.0–5.0)
HCT: 47 % (ref 39.0–52.0)
Hemoglobin: 16.6 g/dL (ref 13.0–17.0)
Lymphocytes Relative: 27.1 % (ref 12.0–46.0)
Lymphs Abs: 1 10*3/uL (ref 0.7–4.0)
MCHC: 35.2 g/dL (ref 30.0–36.0)
MCV: 97.1 fl (ref 78.0–100.0)
Monocytes Absolute: 0.4 10*3/uL (ref 0.1–1.0)
Monocytes Relative: 11.2 % (ref 3.0–12.0)
Neutro Abs: 2.1 10*3/uL (ref 1.4–7.7)
Neutrophils Relative %: 57.7 % (ref 43.0–77.0)
Platelets: 125 10*3/uL — ABNORMAL LOW (ref 150.0–400.0)
RBC: 4.84 Mil/uL (ref 4.22–5.81)
RDW: 12.6 % (ref 11.5–15.5)
WBC: 3.7 10*3/uL — ABNORMAL LOW (ref 4.0–10.5)

## 2019-03-25 LAB — FOLATE: Folate: 20.2 ng/mL (ref >5.9)

## 2019-03-25 LAB — LIPID PANEL
Cholesterol: 188 mg/dL (ref 0–200)
HDL: 34.7 mg/dL — ABNORMAL LOW (ref >39.00)
LDL Cholesterol: 121 mg/dL — ABNORMAL HIGH (ref 0–99)
NonHDL: 152.81
Total CHOL/HDL Ratio: 5
Triglycerides: 161 mg/dL — ABNORMAL HIGH (ref 0.0–149.0)
VLDL: 32.2 mg/dL (ref 0.0–40.0)

## 2019-03-25 LAB — URINALYSIS, ROUTINE W REFLEX MICROSCOPIC
Ketones, ur: NEGATIVE
Leukocytes,Ua: NEGATIVE
Nitrite: NEGATIVE
Specific Gravity, Urine: >=1.03 — AB (ref 1.000–1.030)
Total Protein, Urine: NEGATIVE
Urine Glucose: NEGATIVE
Urobilinogen, UA: 0.2 (ref 0.0–1.0)
pH: 6 (ref 5.0–8.0)

## 2019-03-25 LAB — COMPREHENSIVE METABOLIC PANEL
ALT: 81 U/L — ABNORMAL HIGH (ref 0–53)
AST: 52 U/L — ABNORMAL HIGH (ref 0–37)
Albumin: 4.2 g/dL (ref 3.5–5.2)
Alkaline Phosphatase: 61 U/L (ref 39–117)
BUN: 19 mg/dL (ref 6–23)
CO2: 28 mEq/L (ref 19–32)
Calcium: 9 mg/dL (ref 8.4–10.5)
Chloride: 101 mEq/L (ref 96–112)
Creatinine, Ser: 0.95 mg/dL (ref 0.40–1.50)
GFR: 80.37 mL/min (ref >60.00)
Glucose, Bld: 126 mg/dL — ABNORMAL HIGH (ref 70–99)
Potassium: 3.8 mEq/L (ref 3.5–5.1)
Sodium: 138 mEq/L (ref 135–145)
Total Bilirubin: 1.1 mg/dL (ref 0.2–1.2)
Total Protein: 6.9 g/dL (ref 6.0–8.3)

## 2019-03-25 LAB — VITAMIN D 25 HYDROXY (VIT D DEFICIENCY, FRACTURES): VITD: 39.05 ng/mL (ref 30.00–100.00)

## 2019-03-25 LAB — TSH: TSH: 2.41 u[IU]/mL (ref 0.35–4.50)

## 2019-03-25 LAB — PSA: PSA: 0.88 ng/mL (ref 0.10–4.00)

## 2019-03-25 LAB — HEMOGLOBIN A1C: Hgb A1c MFr Bld: 6.8 % — ABNORMAL HIGH (ref 4.6–6.5)

## 2019-03-25 MED ORDER — IRBESARTAN 150 MG PO TABS: 150.0000 mg | ORAL_TABLET | Freq: Every day | ORAL | 0 refills | Status: DC

## 2019-03-25 MED ORDER — TRIAMTERENE-HCTZ 37.5-25 MG PO CAPS: 1.0000 | ORAL_CAPSULE | Freq: Every day | ORAL | 0 refills | Status: DC

## 2019-03-25 MED ORDER — CYANOCOBALAMIN 1000 MCG/ML IJ SOLN
1000.0000 ug | Freq: Once | INTRAMUSCULAR | Status: AC
  Administered 2019-03-25: 1000 ug via INTRAMUSCULAR

## 2019-03-25 MED ORDER — PIOGLITAZONE HCL 15 MG PO TABS: 15.0000 mg | ORAL_TABLET | Freq: Every day | ORAL | 1 refills | Status: DC

## 2019-03-25 NOTE — Patient Instructions (Signed)

## 2019-03-25 NOTE — Progress Notes (Signed)
Subjective:  Patient ID: Alex [name], male    DOB: 1956-10-03  Age: 63 y.o. MRN: [id number]  CC: Annual Exam and Hypertension   HPI Alex [name] presents for a CPX.  He tells me his blood pressure at home has been relatively well controlled but is never down to 130/80.  His blood pressures at home usually reveal a systolic of about 096 and a diastolic of 28-36.  He has not been working on his lifestyle modifications and has gained weight.  He is somewhat active and denies any recent episodes of CP, DOE, palpitations, edema, or fatigue.  He has not received a vitamin B12 ejection over the last 2 or 3 months.  Outpatient Medications Prior to Visit  Medication Sig Dispense Refill  . Cholecalciferol (VITAMIN D3) 50 MCG (2000 UT) TABS TAKE 1 TABLET BY MOUTH DAILY 90 tablet 1  . esomeprazole (NEXIUM) 40 MG capsule TAKE 1 CAPSULE BY MOUTH EVERY MORNING BEFORE BREAKFAST 30 capsule 6  . fexofenadine (ALLEGRA) 180 MG tablet Take 180 mg by mouth daily.      . potassium chloride SA (K-DUR,KLOR-CON) 20 MEQ tablet TAKE 1 TABLET BY MOUTH EVERY DAY 90 tablet 1  . EDARBYCLOR 40-12.5 MG TABS TAKE 1 TABLET BY MOUTH DAILY 90 tablet 1  . meloxicam (MOBIC) 15 MG tablet Take 15 mg by mouth daily.     No facility-administered medications prior to visit.     ROS Review of Systems  Constitutional: Positive for unexpected weight change (wt gain). Negative for appetite change, diaphoresis and fatigue.  HENT: Negative.  Negative for sore throat.   Eyes: Negative for visual disturbance.  Respiratory: Negative for apnea, cough, shortness of breath and wheezing.   Cardiovascular: Negative for chest pain, palpitations and leg swelling.  Gastrointestinal: Negative for abdominal pain, constipation, diarrhea, nausea and vomiting.  Endocrine: Negative.   Genitourinary: Negative.  Negative for difficulty urinating, dysuria, frequency, hematuria, scrotal swelling, testicular pain and urgency.   Musculoskeletal: Negative.  Negative for arthralgias and myalgias.  Skin: Negative.  Negative for color change and pallor.  Neurological: Negative.  Negative for dizziness, weakness, light-headedness and headaches.  Hematological: Negative for adenopathy. Does not bruise/bleed easily.  Psychiatric/Behavioral: Negative.     Objective:  BP (!) 162/108 (BP Location: Left Arm, Patient Position: Sitting, Cuff Size: Normal)   Pulse 85   Temp 97.7 F (36.5 C) (Oral)   Resp 16   Ht 5' 10"  (1.778 m)   Wt 233 lb (105.7 kg)   SpO2 90%   BMI 33.43 kg/m   BP Readings from Last 3 Encounters:  03/25/19 (!) 162/108  12/08/18 140/88  09/09/18 136/84    Wt Readings from Last 3 Encounters:  03/25/19 233 lb (105.7 kg)  12/08/18 226 lb (102.5 kg)  09/09/18 226 lb 8 oz (102.7 kg)    Physical Exam Vitals signs reviewed.  Constitutional:      Appearance: He is obese. He is not ill-appearing or diaphoretic.  HENT:     Nose: Nose normal. No congestion.     Mouth/Throat:     Mouth: Mucous membranes are moist.     Pharynx: Oropharynx is clear. No oropharyngeal exudate.  Eyes:     General: No scleral icterus.    Conjunctiva/sclera: Conjunctivae normal.  Neck:     Musculoskeletal: Normal range of motion. No neck rigidity.  Cardiovascular:     Rate and Rhythm: Normal rate and regular rhythm.     Heart sounds: Normal heart sounds, S1  normal and S2 normal. No murmur. No S3 or S4 sounds.      Comments: EKG -   Sinus  Rhythm  -Old inferior infarct.   ABNORMAL - no change from the prior EKG  Pulmonary:     Effort: Pulmonary effort is normal. No respiratory distress.     Breath sounds: No stridor. No wheezing, rhonchi or rales.  Abdominal:     General: Abdomen is flat.     Palpations: There is no hepatomegaly, splenomegaly or mass.     Tenderness: There is no abdominal tenderness. There is no guarding.     Hernia: There is no hernia in the right inguinal area or left inguinal area.   Genitourinary:    Pubic Area: No rash.      Penis: Normal and circumcised. No discharge, swelling or lesions.      Scrotum/Testes: Normal.        Right: Mass, tenderness or swelling not present.        Left: Mass, tenderness or swelling not present.     Epididymis:     Right: Normal. Not inflamed or enlarged. No mass.     Left: Normal. Not inflamed or enlarged. No mass.     Prostate: Enlarged (2+ smooth symm BPH). Not tender and no nodules present.     Rectum: Guaiac result positive. Internal hemorrhoid present. No mass, tenderness, anal fissure or external hemorrhoid. Normal anal tone.  Musculoskeletal: Normal range of motion.     Right lower leg: No edema.     Left lower leg: No edema.  Lymphadenopathy:     Cervical: No cervical adenopathy.     Lower Body: No right inguinal adenopathy. No left inguinal adenopathy.  Skin:    General: Skin is warm and dry.     Coloration: Skin is not pale.  Neurological:     General: No focal deficit present.  Psychiatric:        Mood and Affect: Mood normal.        Behavior: Behavior normal.     Lab Results  Component Value Date   WBC 3.7 (L) 03/25/2019   HGB 16.6 03/25/2019   HCT 47.0 03/25/2019   PLT 125.0 Repeated and verified X2. (L) 03/25/2019   GLUCOSE 126 (H) 03/25/2019   CHOL 188 03/25/2019   TRIG 161.0 (H) 03/25/2019   HDL 34.70 (L) 03/25/2019   LDLDIRECT 146.5 12/05/2010   LDLCALC 121 (H) 03/25/2019   ALT 81 (H) 03/25/2019   AST 52 (H) 03/25/2019   NA 138 03/25/2019   K 3.8 03/25/2019   CL 101 03/25/2019   CREATININE 0.95 03/25/2019   BUN 19 03/25/2019   CO2 28 03/25/2019   TSH 2.41 03/25/2019   PSA 0.88 03/25/2019   HGBA1C 6.8 (H) 03/25/2019    Ct Chest Wo Contrast  Result Date: 07/10/2018 CLINICAL DATA:  Abnormal chest x-ray.  Persistent cough 3 weeks. EXAM: CT CHEST WITHOUT CONTRAST TECHNIQUE: Multidetector CT imaging of the chest was performed following the standard protocol without IV contrast. COMPARISON:   Chest x-ray 07/09/2018 FINDINGS: Cardiovascular: Heart is normal size. There is calcified plaque over the left main and 3 vessel coronary arteries. Mild calcified plaque over the thoracic aorta. Mediastinum/Nodes: No mediastinal or hilar adenopathy. Remaining mediastinal structures are notable only for mild debris within the mid esophagus which may be due to dysmotility or reflux. Lungs/Pleura: Lungs are well inflated without focal airspace consolidation or effusion. There is a 4 mm peripheral nodule over the lateral right  lower lobe as well as 4 mm subpleural nodule over the lateral left lower lobe. Airways are normal. Upper Abdomen: Colonic diverticulosis. No acute findings. Calcified plaque over the abdominal aorta. Musculoskeletal: Degenerative change of the spine with minimal anterior wedging of several adjacent midthoracic vertebral bodies likely chronic. IMPRESSION: No acute cardiopulmonary disease. 2 small 4 mm nodules over the lower lobes as described. Recommend follow-up noncontrast chest CT 1 year. This recommendation follows the consensus statement: Guidelines for Management of Small Pulmonary Nodules Detected on CT Scans: A Statement from the [place] as published in Radiology 2005; 237:395-400. Online at: https://www.arnold.com/. Aortic Atherosclerosis (ICD10-I70.0). Left main and 3 vessel atherosclerotic coronary artery disease. Mild debris within the mid esophagus likely due to dysmotility or reflux. Colonic diverticulosis. Mild anterior wedging of several adjacent midthoracic vertebral bodies likely chronic. Electronically Signed   By: Marin Olp M.D.   On: 07/10/2018 15:30    Assessment & Plan:   Tulio was seen today for annual exam and hypertension.  Diagnoses and all orders for this visit:  Essential hypertension- His blood pressure is not adequately well controlled.  He has a history of hypokalemia.  He has not been successful with lifestyle  modifications.  His labs are negative for secondary causes or endorgan damage.  I have asked him to change from the current ARB/thiazide diuretic combination to the combination of an ARB, thiazide diuretic, and potassium sparing diuretic.  I have also asked him to improve his lifestyle modifications. -     CBC with Differential/Platelet; Future -     Comprehensive metabolic panel; Future -     TSH; Future -     Urinalysis, Routine w reflex microscopic; Future -     EKG 12-Lead -     triamterene-hydrochlorothiazide (DYAZIDE) 37.5-25 MG capsule; Take 1 each (1 capsule total) by mouth daily. -     irbesartan (AVAPRO) 150 MG tablet; Take 1 tablet (150 mg total) by mouth daily.  Routine general medical examination at a health care facility- Exam completed, labs reviewed, vaccines reviewed, I have referred him for colon cancer screening, patient education material was given. -     Lipid panel; Future -     PSA; Future  Vitamin D deficiency -     VITAMIN D 25 Hydroxy (Vit-D Deficiency, Fractures); Future  Hyperglycemia -     Comprehensive metabolic panel; Future -     Hemoglobin A1c; Future  Vitamin B12 deficiency - His platelet count is low at 125.  This could be a complication of P37 deficiency.  I have asked him to restart monthly B12 injections. -     CBC with Differential/Platelet; Future -     Cancel: Vitamin B12; Future -     Folate; Future -     cyanocobalamin ((VITAMIN B-12)) injection 1,000 mcg  Occult blood in stools -     Ambulatory referral to Gastroenterology  Nonalcoholic steatohepatitis (NASH)- In addition to lifestyle modifications I asked him to start taking pioglitazone to mitigate some of the complications from NASH. -     pioglitazone (ACTOS) 15 MG tablet; Take 1 tablet (15 mg total) by mouth daily.  Type II diabetes mellitus with manifestations ([place])- His A1c is up to 6.8%.  He has new onset type 2 diabetes mellitus.  -     pioglitazone (ACTOS) 15 MG tablet; Take 1  tablet (15 mg total) by mouth daily.   I have discontinued Alex Dixons meloxicam and Lexicographer. I am also having  him start on triamterene-hydrochlorothiazide, irbesartan, and pioglitazone. Additionally, I am having him maintain his fexofenadine, Vitamin D3, potassium chloride SA, and esomeprazole. We administered cyanocobalamin.  Meds ordered this encounter  Medications  . triamterene-hydrochlorothiazide (DYAZIDE) 37.5-25 MG capsule    Sig: Take 1 each (1 capsule total) by mouth daily.    Dispense:  90 capsule    Refill:  0  . irbesartan (AVAPRO) 150 MG tablet    Sig: Take 1 tablet (150 mg total) by mouth daily.    Dispense:  90 tablet    Refill:  0  . cyanocobalamin ((VITAMIN B-12)) injection 1,000 mcg  . pioglitazone (ACTOS) 15 MG tablet    Sig: Take 1 tablet (15 mg total) by mouth daily.    Dispense:  90 tablet    Refill:  1     Follow-up: Return in about 4 weeks (around 04/22/2019).  Scarlette Calico, MD

## 2019-05-05 ENCOUNTER — Encounter: Payer: Self-pay | Admitting: Internal Medicine

## 2019-05-05 ENCOUNTER — Other Ambulatory Visit (INDEPENDENT_AMBULATORY_CARE_PROVIDER_SITE_OTHER): Payer: BC Managed Care – PPO

## 2019-05-05 ENCOUNTER — Other Ambulatory Visit: Payer: Self-pay

## 2019-05-05 ENCOUNTER — Ambulatory Visit (INDEPENDENT_AMBULATORY_CARE_PROVIDER_SITE_OTHER): Payer: BC Managed Care – PPO | Admitting: Internal Medicine

## 2019-05-05 VITALS — BP 138/86 | HR 83 | Temp 97.9°F | Resp 16 | Ht 70.0 in | Wt 226.0 lb

## 2019-05-05 DIAGNOSIS — E538 Deficiency of other specified B group vitamins: Secondary | ICD-10-CM

## 2019-05-05 DIAGNOSIS — K7581 Nonalcoholic steatohepatitis (NASH): Secondary | ICD-10-CM

## 2019-05-05 DIAGNOSIS — I10 Essential (primary) hypertension: Secondary | ICD-10-CM

## 2019-05-05 LAB — CBC WITH DIFFERENTIAL/PLATELET
Basophils Absolute: 0 10*3/uL (ref 0.0–0.1)
Basophils Relative: 0.2 % (ref 0.0–3.0)
Eosinophils Absolute: 0.1 10*3/uL (ref 0.0–0.7)
Eosinophils Relative: 2 % (ref 0.0–5.0)
HCT: 47.2 % (ref 39.0–52.0)
Hemoglobin: 15.9 g/dL (ref 13.0–17.0)
Lymphocytes Relative: 29.1 % (ref 12.0–46.0)
Lymphs Abs: 1.2 10*3/uL (ref 0.7–4.0)
MCHC: 33.7 g/dL (ref 30.0–36.0)
MCV: 97.7 fl (ref 78.0–100.0)
Monocytes Absolute: 0.5 10*3/uL (ref 0.1–1.0)
Monocytes Relative: 11.1 % (ref 3.0–12.0)
Neutro Abs: 2.5 10*3/uL (ref 1.4–7.7)
Neutrophils Relative %: 57.6 % (ref 43.0–77.0)
Platelets: 126 10*3/uL — ABNORMAL LOW (ref 150.0–400.0)
RBC: 4.83 Mil/uL (ref 4.22–5.81)
RDW: 12.8 % (ref 11.5–15.5)
WBC: 4.3 10*3/uL (ref 4.0–10.5)

## 2019-05-05 LAB — HEPATIC FUNCTION PANEL
ALT: 53 U/L (ref 0–53)
AST: 29 U/L (ref 0–37)
Albumin: 4.5 g/dL (ref 3.5–5.2)
Alkaline Phosphatase: 59 U/L (ref 39–117)
Bilirubin, Direct: 0.1 mg/dL (ref 0.0–0.3)
Total Bilirubin: 0.7 mg/dL (ref 0.2–1.2)
Total Protein: 7.3 g/dL (ref 6.0–8.3)

## 2019-05-05 LAB — BASIC METABOLIC PANEL
BUN: 28 mg/dL — ABNORMAL HIGH (ref 6–23)
CO2: 23 mEq/L (ref 19–32)
Calcium: 9.1 mg/dL (ref 8.4–10.5)
Chloride: 104 mEq/L (ref 96–112)
Creatinine, Ser: 0.96 mg/dL (ref 0.40–1.50)
GFR: 79.38 mL/min (ref >60.00)
Glucose, Bld: 115 mg/dL — ABNORMAL HIGH (ref 70–99)
Potassium: 3.8 mEq/L (ref 3.5–5.1)
Sodium: 137 mEq/L (ref 135–145)

## 2019-05-05 MED ORDER — CYANOCOBALAMIN 1000 MCG/ML IJ SOLN
1000.0000 ug | Freq: Once | INTRAMUSCULAR | Status: AC
  Administered 2019-05-05: 1000 ug via INTRAMUSCULAR

## 2019-05-05 NOTE — Progress Notes (Signed)
Subjective:  Patient ID: Alex [name], male    DOB: 08-11-1956  Age: 63 y.o. MRN: [id number]  CC: Hypertension   HPI Alex [name] presents for f/up - Since I last saw him he has been working on his lifestyle modification and has been able to lose weight.  He tells me his blood pressure has been well controlled.  He tolerates exertion well with no chest pain or shortness of breath.  Outpatient Medications Prior to Visit  Medication Sig Dispense Refill   celecoxib (CELEBREX) 200 MG capsule Take 200 mg by mouth 2 (two) times daily.     Cholecalciferol (VITAMIN D3) 50 MCG (2000 UT) TABS TAKE 1 TABLET BY MOUTH DAILY 90 tablet 1   esomeprazole (NEXIUM) 40 MG capsule TAKE 1 CAPSULE BY MOUTH EVERY MORNING BEFORE BREAKFAST 30 capsule 6   fexofenadine (ALLEGRA) 180 MG tablet Take 180 mg by mouth daily.       irbesartan (AVAPRO) 150 MG tablet Take 1 tablet (150 mg total) by mouth daily. 90 tablet 0   pioglitazone (ACTOS) 15 MG tablet Take 1 tablet (15 mg total) by mouth daily. 90 tablet 1   potassium chloride SA (K-DUR,KLOR-CON) 20 MEQ tablet TAKE 1 TABLET BY MOUTH EVERY DAY 90 tablet 1   triamterene-hydrochlorothiazide (DYAZIDE) 37.5-25 MG capsule Take 1 each (1 capsule total) by mouth daily. 90 capsule 0   No facility-administered medications prior to visit.     ROS Review of Systems  Constitutional: Negative for diaphoresis, fatigue and unexpected weight change.  HENT: Negative.   Eyes: Negative for visual disturbance.  Respiratory: Negative for cough, chest tightness, shortness of breath and wheezing.   Cardiovascular: Negative for chest pain, palpitations and leg swelling.  Gastrointestinal: Negative for abdominal pain, blood in stool, constipation and diarrhea.  Endocrine: Negative.   Genitourinary: Negative.  Negative for difficulty urinating.  Musculoskeletal: Negative.  Negative for arthralgias and myalgias.  Skin: Negative.   Neurological: Negative.  Negative  for dizziness, weakness and light-headedness.  Hematological: Negative for adenopathy. Does not bruise/bleed easily.  Psychiatric/Behavioral: Negative.     Objective:  BP 138/86 (BP Location: Left Arm, Patient Position: Sitting, Cuff Size: Normal)    Pulse 83    Temp 97.9 F (36.6 C) (Oral)    Resp 16    Ht 5' 10"  (1.778 m)    Wt 226 lb (102.5 kg)    SpO2 96%    BMI 32.43 kg/m   BP Readings from Last 3 Encounters:  05/05/19 138/86  03/25/19 (!) 162/108  12/08/18 140/88    Wt Readings from Last 3 Encounters:  05/05/19 226 lb (102.5 kg)  03/25/19 233 lb (105.7 kg)  12/08/18 226 lb (102.5 kg)    Physical Exam Constitutional:      Appearance: He is obese. He is not ill-appearing or diaphoretic.  HENT:     Nose: Nose normal.     Mouth/Throat:     Mouth: Mucous membranes are moist.  Eyes:     General: No scleral icterus.    Conjunctiva/sclera: Conjunctivae normal.  Neck:     Musculoskeletal: Normal range of motion. No neck rigidity.  Cardiovascular:     Rate and Rhythm: Normal rate and regular rhythm.     Heart sounds: No murmur.  Pulmonary:     Effort: Pulmonary effort is normal.     Breath sounds: No stridor. No wheezing, rhonchi or rales.  Abdominal:     General: Abdomen is protuberant. Bowel sounds are normal. There  is no distension.     Palpations: There is no hepatomegaly.     Tenderness: There is no abdominal tenderness.  Musculoskeletal: Normal range of motion.     Right lower leg: No edema.     Left lower leg: No edema.  Skin:    General: Skin is warm and dry.     Coloration: Skin is not pale.  Neurological:     General: No focal deficit present.     Mental Status: He is alert.  Psychiatric:        Mood and Affect: Mood normal.        Behavior: Behavior normal.     Lab Results  Component Value Date   WBC 4.3 05/05/2019   HGB 15.9 05/05/2019   HCT 47.2 05/05/2019   PLT 126.0 (L) 05/05/2019   GLUCOSE 115 (H) 05/05/2019   CHOL 188 03/25/2019   TRIG  161.0 (H) 03/25/2019   HDL 34.70 (L) 03/25/2019   LDLDIRECT 146.5 12/05/2010   LDLCALC 121 (H) 03/25/2019   ALT 53 05/05/2019   AST 29 05/05/2019   NA 137 05/05/2019   K 3.8 05/05/2019   CL 104 05/05/2019   CREATININE 0.96 05/05/2019   BUN 28 (H) 05/05/2019   CO2 23 05/05/2019   TSH 2.41 03/25/2019   PSA 0.88 03/25/2019   HGBA1C 6.8 (H) 03/25/2019    Ct Chest Wo Contrast  Result Date: 07/10/2018 CLINICAL DATA:  Abnormal chest x-ray.  Persistent cough 3 weeks. EXAM: CT CHEST WITHOUT CONTRAST TECHNIQUE: Multidetector CT imaging of the chest was performed following the standard protocol without IV contrast. COMPARISON:  Chest x-ray 07/09/2018 FINDINGS: Cardiovascular: Heart is normal size. There is calcified plaque over the left main and 3 vessel coronary arteries. Mild calcified plaque over the thoracic aorta. Mediastinum/Nodes: No mediastinal or hilar adenopathy. Remaining mediastinal structures are notable only for mild debris within the mid esophagus which may be due to dysmotility or reflux. Lungs/Pleura: Lungs are well inflated without focal airspace consolidation or effusion. There is a 4 mm peripheral nodule over the lateral right lower lobe as well as 4 mm subpleural nodule over the lateral left lower lobe. Airways are normal. Upper Abdomen: Colonic diverticulosis. No acute findings. Calcified plaque over the abdominal aorta. Musculoskeletal: Degenerative change of the spine with minimal anterior wedging of several adjacent midthoracic vertebral bodies likely chronic. IMPRESSION: No acute cardiopulmonary disease. 2 small 4 mm nodules over the lower lobes as described. Recommend follow-up noncontrast chest CT 1 year. This recommendation follows the consensus statement: Guidelines for Management of Small Pulmonary Nodules Detected on CT Scans: A Statement from the [place] as published in Radiology 2005; 237:395-400. Online at: https://www.arnold.com/.  Aortic Atherosclerosis (ICD10-I70.0). Left main and 3 vessel atherosclerotic coronary artery disease. Mild debris within the mid esophagus likely due to dysmotility or reflux. Colonic diverticulosis. Mild anterior wedging of several adjacent midthoracic vertebral bodies likely chronic. Electronically Signed   By: Marin Olp M.D.   On: 07/10/2018 15:30    Assessment & Plan:   Donyale was seen today for hypertension.  Diagnoses and all orders for this visit:  B12 deficiency -     cyanocobalamin ((VITAMIN B-12)) injection 1,000 mcg -     CBC with Differential/Platelet; Future  Essential hypertension- His blood pressure is adequately well controlled.  Electrolytes and renal function are normal.  Will continue the combination of irbesartan, triamterene, and hydrochlorothiazide. -     Basic metabolic panel; Future  Nonalcoholic steatohepatitis (NASH)- His LFTs have  improved.  He was praised for the lifestyle modifications.  I have asked him to continue taking pioglitazone. -     Hepatic function panel; Future   I am having Pierce Crane. [facility]" maintain his fexofenadine, Vitamin D3, potassium chloride SA, esomeprazole, triamterene-hydrochlorothiazide, irbesartan, pioglitazone, and celecoxib. We administered cyanocobalamin.  Meds ordered this encounter  Medications   cyanocobalamin ((VITAMIN B-12)) injection 1,000 mcg     Follow-up: Return in about 6 months (around 11/05/2019).  Scarlette Calico, MD

## 2019-05-05 NOTE — Patient Instructions (Signed)

## 2019-05-06 ENCOUNTER — Encounter: Payer: Self-pay | Admitting: Internal Medicine

## 2019-05-07 ENCOUNTER — Ambulatory Visit: Payer: BLUE CROSS/BLUE SHIELD | Admitting: Internal Medicine

## 2019-06-07 ENCOUNTER — Other Ambulatory Visit: Payer: Self-pay | Admitting: Internal Medicine

## 2019-06-07 DIAGNOSIS — E559 Vitamin D deficiency, unspecified: Secondary | ICD-10-CM

## 2019-06-07 MED ORDER — VITAMIN D3 50 MCG (2000 UT) PO TABS: 1.0000 | ORAL_TABLET | Freq: Every day | ORAL | 1 refills | Status: DC

## 2019-06-09 ENCOUNTER — Ambulatory Visit (INDEPENDENT_AMBULATORY_CARE_PROVIDER_SITE_OTHER): Payer: BC Managed Care – PPO

## 2019-06-09 DIAGNOSIS — E538 Deficiency of other specified B group vitamins: Secondary | ICD-10-CM

## 2019-06-09 MED ORDER — CYANOCOBALAMIN 1000 MCG/ML IJ SOLN
1000.0000 ug | Freq: Once | INTRAMUSCULAR | Status: AC
  Administered 2019-06-09: 1000 ug via INTRAMUSCULAR

## 2019-06-09 NOTE — Progress Notes (Signed)
I have reviewed and agree.

## 2019-06-11 ENCOUNTER — Ambulatory Visit (INDEPENDENT_AMBULATORY_CARE_PROVIDER_SITE_OTHER): Payer: BC Managed Care – PPO | Admitting: Internal Medicine

## 2019-06-11 ENCOUNTER — Encounter: Payer: Self-pay | Admitting: Internal Medicine

## 2019-06-11 ENCOUNTER — Other Ambulatory Visit (INDEPENDENT_AMBULATORY_CARE_PROVIDER_SITE_OTHER): Payer: BC Managed Care – PPO

## 2019-06-11 VITALS — BP 132/80 | HR 88 | Temp 97.8°F | Ht 68.5 in | Wt 221.0 lb

## 2019-06-11 DIAGNOSIS — Z1211 Encounter for screening for malignant neoplasm of colon: Secondary | ICD-10-CM

## 2019-06-11 DIAGNOSIS — R945 Abnormal results of liver function studies: Secondary | ICD-10-CM

## 2019-06-11 DIAGNOSIS — R195 Other fecal abnormalities: Secondary | ICD-10-CM

## 2019-06-11 DIAGNOSIS — R7989 Other specified abnormal findings of blood chemistry: Secondary | ICD-10-CM

## 2019-06-11 DIAGNOSIS — K219 Gastro-esophageal reflux disease without esophagitis: Secondary | ICD-10-CM | POA: Diagnosis not present

## 2019-06-11 DIAGNOSIS — K2270 Barrett's esophagus without dysplasia: Secondary | ICD-10-CM | POA: Diagnosis not present

## 2019-06-11 DIAGNOSIS — K760 Fatty (change of) liver, not elsewhere classified: Secondary | ICD-10-CM

## 2019-06-11 LAB — IBC PANEL
Iron: 107 ug/dL (ref 42–165)
Saturation Ratios: 28.1 % (ref 20.0–50.0)
Transferrin: 272 mg/dL (ref 212.0–360.0)

## 2019-06-11 LAB — IRON: Iron: 107 ug/dL (ref 42–165)

## 2019-06-11 LAB — FERRITIN: Ferritin: 103.1 ng/mL (ref 22.0–322.0)

## 2019-06-11 MED ORDER — NA SULFATE-K SULFATE-MG SULF 17.5-3.13-1.6 GM/177ML PO SOLN: 1.0000 | Freq: Once | ORAL | 0 refills | Status: AC

## 2019-06-11 NOTE — Patient Instructions (Signed)
Your provider has requested that you go to the basement level for lab work before leaving today. Press "B" on the elevator. The lab is located at the first door on the left as you exit the elevator.  You have been scheduled for an abdominal ultrasound at [facility] (1st floor of hospital) on 06/23/2019 at 9:00am. Please arrive 15 minutes prior to your appointment for registration. Make certain not to have anything to eat or drink after midnight prior to your appointment. Should you need to reschedule your appointment, please contact radiology at [phone]. This test typically takes about 30 minutes to perform.  You have been scheduled for an endoscopy and colonoscopy. Please follow the written instructions given to you at your visit today. Please pick up your prep supplies at the pharmacy within the next 1-3 days. If you use inhalers (even only as needed), please bring them with you on the day of your procedure.

## 2019-06-11 NOTE — Progress Notes (Signed)
HISTORY OF PRESENT ILLNESS:  Alex [name] is a 63 y.o. 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 Ronnald Ramp regarding Hemoccult positive stool on rectal exam.  I last saw [name] December 08, 2018 regarding chronic GERD complicated by Barrett's esophagus and colon cancer screening.  At that time he was due for both surveillance upper endoscopy and routine screening colonoscopy.  Those examinations have not been scheduled.  For his GERD and Barrett's he continues on Nexium 40 mg daily with good control of symptoms.  His GI review of systems is unremarkable.  Reviewing his laboratories reveals intermittent abnormal hepatic transaminases with ALT greater than AST and typically less than 100, though not always.  As well intermittent liver tests in the normal range.  Also persistent low platelets and occasionally low white blood cell count.  Normal MCV.  Normal bilirubin and albumin.  His last surveillance upper endoscopy with short segment Barrett's over 2015.  No dysplasia./Screening colonoscopy with Dr. Sharlett Iles April 2010 was normal except for moderate left-sided diverticulosis.  The patient did undergo an abdominal ultrasound for elevated liver tests February 2012.  The liver did demonstrate changes consistent with hepatic steatosis.  He has had prior testing for hepatitis C which was negative.  He denies alcohol use.  No family history of liver cancer.  REVIEW OF SYSTEMS:  All non-GI ROS negative unless otherwise stated in the HPI.  Past Medical History:  Diagnosis Date  . Achilles tendinitis    PMH of  . B12 deficiency   . Barrett's esophagus   . Diverticulosis of colon (without mention of hemorrhage) 2010  . Fatty liver 10-16-2011   [place]  . GERD (gastroesophageal reflux disease)   . Gilbert syndrome   . Hiatal hernia   . 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 Hyperlipidemia   . Hypertension   . Nonspecific elevation of levels of transaminase or lactic  acid dehydrogenase (LDH)    PMH of  . OSA on CPAP     Past Surgical History:  Procedure Laterality Date  . COLONOSCOPY  2010   negative  . KNEE ARTHROSCOPY     R 2015 & L 2016; [facility]  . REPLACEMENT TOTAL KNEE Right 01/15/2018  . REPLACEMENT TOTAL KNEE Left 02/19/2018  . UPPER GASTROINTESTINAL ENDOSCOPY     Dr Sharlett Iles; multiple  snce 1992  . VASECTOMY    . WISDOM TOOTH EXTRACTION      Social History [name]  reports that he has never smoked. He has never used smokeless tobacco. He reports that he does not drink alcohol or use drugs.  family history includes Cancer in his mother; Esophageal cancer in his father; Heart attack (age of onset: 77) in his mother; Heart disease in his father; Hypertension in his father, mother, sister, sister, and sister; Stomach cancer in his father.  Allergies  Allergen Reactions  . Diltiazem Other (See Comments)    Leg edema  . Spironolactone Other (See Comments)    gynecomastia       PHYSICAL EXAMINATION: Vital signs: BP 132/80 (BP Location: Left Arm, Patient Position: Sitting, Cuff Size: Normal)   Pulse 88   Temp 97.8 F (36.6 C)   Ht 5' 8.5" (1.74 m) Comment: height measured without shoes  Wt 221 lb (100.2 kg)   BMI 33.11 kg/m   Constitutional: generally well-appearing, no acute distress Psychiatric: alert and oriented x3, cooperative Eyes: extraocular movements intact, anicteric, conjunctiva pink Mouth: oral pharynx moist, no  lesions Neck: supple no lymphadenopathy Cardiovascular: heart regular rate and rhythm, no murmur Lungs: clear to auscultation bilaterally Abdomen: soft, obese, nontender, nondistended, no obvious ascites, no peritoneal signs, normal bowel sounds, no organomegaly Rectal: Deferred until colonoscopy Extremities: no clubbing, cyanosis, or lower extremity edema bilaterally Skin: no lesions on visible extremities Neuro: No focal deficits. No asterixis.    ASSESSMENT:  1.  Heme positive stool  on rectal exam.  Rule out occult GI mucosal lesion 2.  GERD complicated by Barrett's esophagus (nondysplastic short segment Barrett's).  GERD symptoms controlled on Nexium.  Due for surveillance.  Last examination 2015 3.  Colonoscopy 2010 with diverticulosis 4.  Chronically elevated liver tests and low platelets.  Concerning for cirrhosis.  Likely NASH 5.  Remote ultrasound demonstrating hepatic steatosis   PLAN:  1.  Schedule abdominal ultrasound to rule out cirrhosis and splenomegaly. 2.  Check iron studies to rule out hemochromatosis 3.  Exercise and weight loss for fatty liver 4.  Schedule colonoscopy for colon cancer screening and evaluate Hemoccult-positive stool.The nature of the procedure, as well as the risks, benefits, and alternatives were carefully and thoroughly reviewed with the patient. Ample time for discussion and questions allowed. The patient understood, was satisfied, and agreed to proceed. 5.  Schedule upper endoscopy with biopsies for Barrett's surveillance.The nature of the procedure, as well as the risks, benefits, and alternatives were carefully and thoroughly reviewed with the patient. Ample time for discussion and questions allowed. The patient understood, was satisfied, and agreed to proceed. 6.  Continue Nexium 40 mg daily to control reflux symptoms

## 2019-06-21 ENCOUNTER — Other Ambulatory Visit: Payer: Self-pay | Admitting: Internal Medicine

## 2019-06-21 DIAGNOSIS — I10 Essential (primary) hypertension: Secondary | ICD-10-CM

## 2019-06-21 DIAGNOSIS — E876 Hypokalemia: Secondary | ICD-10-CM

## 2019-06-21 MED ORDER — TRIAMTERENE-HCTZ 37.5-25 MG PO CAPS: 1.0000 | ORAL_CAPSULE | Freq: Every day | ORAL | 0 refills | Status: DC

## 2019-06-21 MED ORDER — POTASSIUM CHLORIDE CRYS ER 20 MEQ PO TBCR: 20.0000 meq | EXTENDED_RELEASE_TABLET | Freq: Every day | ORAL | 1 refills | Status: DC

## 2019-06-21 MED ORDER — IRBESARTAN 150 MG PO TABS: 150.0000 mg | ORAL_TABLET | Freq: Every day | ORAL | 0 refills | Status: DC

## 2019-06-23 ENCOUNTER — Ambulatory Visit (HOSPITAL_COMMUNITY)
Admission: RE | Admit: 2019-06-23 | Discharge: 2019-06-23 | Disposition: A | Discharge status: Home / Self Care | Payer: BC Managed Care – PPO | Source: Ambulatory Visit | Attending: Internal Medicine | Admitting: Internal Medicine

## 2019-06-23 ENCOUNTER — Other Ambulatory Visit: Payer: Self-pay

## 2019-06-23 DIAGNOSIS — R195 Other fecal abnormalities: Secondary | ICD-10-CM | POA: Insufficient documentation

## 2019-06-23 DIAGNOSIS — K760 Fatty (change of) liver, not elsewhere classified: Secondary | ICD-10-CM | POA: Diagnosis not present

## 2019-06-23 DIAGNOSIS — Z1211 Encounter for screening for malignant neoplasm of colon: Secondary | ICD-10-CM | POA: Insufficient documentation

## 2019-06-23 DIAGNOSIS — R7989 Other specified abnormal findings of blood chemistry: Secondary | ICD-10-CM

## 2019-06-23 DIAGNOSIS — K2270 Barrett's esophagus without dysplasia: Secondary | ICD-10-CM | POA: Diagnosis not present

## 2019-06-23 DIAGNOSIS — K219 Gastro-esophageal reflux disease without esophagitis: Secondary | ICD-10-CM | POA: Insufficient documentation

## 2019-06-23 DIAGNOSIS — R945 Abnormal results of liver function studies: Secondary | ICD-10-CM | POA: Insufficient documentation

## 2019-07-06 ENCOUNTER — Other Ambulatory Visit: Payer: Self-pay | Admitting: Internal Medicine

## 2019-07-06 DIAGNOSIS — R911 Solitary pulmonary nodule: Secondary | ICD-10-CM

## 2019-07-06 DIAGNOSIS — R918 Other nonspecific abnormal finding of lung field: Secondary | ICD-10-CM

## 2019-07-06 DIAGNOSIS — R9389 Abnormal findings on diagnostic imaging of other specified body structures: Secondary | ICD-10-CM

## 2019-07-14 ENCOUNTER — Ambulatory Visit (INDEPENDENT_AMBULATORY_CARE_PROVIDER_SITE_OTHER): Payer: BC Managed Care – PPO

## 2019-07-14 DIAGNOSIS — E538 Deficiency of other specified B group vitamins: Secondary | ICD-10-CM

## 2019-07-14 MED ORDER — CYANOCOBALAMIN 1000 MCG/ML IJ SOLN
1000.0000 ug | Freq: Once | INTRAMUSCULAR | Status: AC
  Administered 2019-07-14: 1000 ug via INTRAMUSCULAR

## 2019-07-14 NOTE — Progress Notes (Signed)
I have reviewed and agree.

## 2019-07-15 ENCOUNTER — Encounter: Payer: Self-pay | Admitting: Internal Medicine

## 2019-07-19 ENCOUNTER — Telehealth: Payer: Self-pay

## 2019-07-19 NOTE — Telephone Encounter (Signed)
Covid-19 screening questions   Do you now or have you had a fever in the last 14 days?  Do you have any respiratory symptoms of shortness of breath or cough now or in the last 14 days?  Do you have any family members or close contacts with diagnosed or suspected Covid-19 in the past 14 days?  Have you been tested for Covid-19 and found to be positive?       

## 2019-07-20 ENCOUNTER — Other Ambulatory Visit: Payer: Self-pay

## 2019-07-20 ENCOUNTER — Encounter: Payer: Self-pay | Admitting: Internal Medicine

## 2019-07-20 ENCOUNTER — Ambulatory Visit (AMBULATORY_SURGERY_CENTER): Payer: BC Managed Care – PPO | Admitting: Internal Medicine

## 2019-07-20 ENCOUNTER — Other Ambulatory Visit: Payer: Self-pay | Admitting: Internal Medicine

## 2019-07-20 VITALS — BP 117/74 | HR 71 | Temp 97.6°F | Resp 11 | Ht 68.0 in | Wt 221.0 lb

## 2019-07-20 DIAGNOSIS — K2270 Barrett's esophagus without dysplasia: Secondary | ICD-10-CM

## 2019-07-20 DIAGNOSIS — D128 Benign neoplasm of rectum: Secondary | ICD-10-CM

## 2019-07-20 DIAGNOSIS — Z1211 Encounter for screening for malignant neoplasm of colon: Secondary | ICD-10-CM

## 2019-07-20 DIAGNOSIS — K621 Rectal polyp: Secondary | ICD-10-CM

## 2019-07-20 DIAGNOSIS — K219 Gastro-esophageal reflux disease without esophagitis: Secondary | ICD-10-CM

## 2019-07-20 DIAGNOSIS — R195 Other fecal abnormalities: Secondary | ICD-10-CM

## 2019-07-20 LAB — HM COLONOSCOPY

## 2019-07-20 MED ORDER — SODIUM CHLORIDE 0.9 % IV SOLN: 500.0000 mL | Freq: Once | INTRAVENOUS | Status: DC

## 2019-07-20 NOTE — Progress Notes (Signed)
Temperatures taken by June Bullock today, not [place]

## 2019-07-20 NOTE — Patient Instructions (Signed)
Handouts given for polyps, diverticulosis, hemorrhoids, hiatal hernia, Barrett's esophagus and GERD.   YOU HAD AN ENDOSCOPIC PROCEDURE TODAY AT [facility]:   Refer to the procedure report that was given to you for any specific questions about what was found during the examination.  If the procedure report does not answer your questions, please call your gastroenterologist to clarify.  If you requested that your care partner not be given the details of your procedure findings, then the procedure report has been included in a sealed envelope for you to review at your convenience later.  YOU SHOULD EXPECT: Some feelings of bloating in the abdomen. Passage of more gas than usual.  Walking can help get rid of the air that was put into your GI tract during the procedure and reduce the bloating. If you had a lower endoscopy (such as a colonoscopy or flexible sigmoidoscopy) you may notice spotting of blood in your stool or on the toilet paper. If you underwent a bowel prep for your procedure, you may not have a normal bowel movement for a few days.  Please Note:  You might notice some irritation and congestion in your nose or some drainage.  This is from the oxygen used during your procedure.  There is no need for concern and it should clear up in a day or so.  SYMPTOMS TO REPORT IMMEDIATELY:   Following lower endoscopy (colonoscopy or flexible sigmoidoscopy):  Excessive amounts of blood in the stool  Significant tenderness or worsening of abdominal pains  Swelling of the abdomen that is new, acute  Fever of 100F or higher   Following upper endoscopy (EGD)  Vomiting of blood or coffee ground material  New chest pain or pain under the shoulder blades  Painful or persistently difficult swallowing  New shortness of breath  Fever of 100F or higher  Black, tarry-looking stools  For urgent or emergent issues, a gastroenterologist can be reached at any hour by calling [phone]  [phone].   DIET:  We do recommend a small meal at first, but then you may proceed to your regular diet.  Drink plenty of fluids but you should avoid alcoholic beverages for 24 hours.  ACTIVITY:  You should plan to take it easy for the rest of today and you should NOT DRIVE or use heavy machinery until tomorrow (because of the sedation medicines used during the test).    FOLLOW UP: Our staff will call the number listed on your records 48-72 hours following your procedure to check on you and address any questions or concerns that you may have regarding the information given to you following your procedure. If we do not reach you, we will leave a message.  We will attempt to reach you two times.  During this call, we will ask if you have developed any symptoms of COVID 19. If you develop any symptoms (ie: fever, flu-like symptoms, shortness of breath, cough etc.) before then, please call [phone].  If you test positive for Covid 19 in the 2 weeks post procedure, please call and report this information to [place].    If any biopsies were taken you will be contacted by phone or by letter within the next 1-3 weeks.  Please call us at [phone] if you have not heard about the biopsies in 3 weeks.    SIGNATURES/CONFIDENTIALITY: You and/or your care partner have signed paperwork which will be entered into your electronic medical record.  These signatures attest to the fact that  that the information above on your After Visit Summary has been reviewed and is understood.  Full responsibility of the confidentiality of this discharge information lies with you and/or your care-partner.

## 2019-07-20 NOTE — Progress Notes (Signed)
Called to room to assist during endoscopic procedure.  Patient ID and intended procedure confirmed with present staff. Received instructions for my participation in the procedure from the performing physician.  

## 2019-07-20 NOTE — Progress Notes (Signed)
PT taken to PACU. Monitors in place. VSS. Report given to RN. 

## 2019-07-20 NOTE — Op Note (Signed)
[place] Patient Name: Alex [name] Procedure Date: 07/20/2019 11:01 AM MRN: [id number] Endoscopist: Docia Chuck. Henrene Pastor , MD Age: 63 Referring MD:  Date of Birth: Feb 20, 1956 Gender: Male Account #: [id number] Procedure:                Upper GI endoscopy with biopsies Indications:              Surveillance for malignancy due to personal history                            of Barrett's esophagus. Last examination 2015 with                            short segment nondysplastic Barrett's Medicines:                Monitored Anesthesia Care Procedure:                Pre-Anesthesia Assessment:                           - Prior to the procedure, a History and Physical                            was performed, and patient medications and                            allergies were reviewed. The patient's tolerance of                            previous anesthesia was also reviewed. The risks                            and benefits of the procedure and the sedation                            options and risks were discussed with the patient.                            All questions were answered, and informed consent                            was obtained. Prior Anticoagulants: The patient has                            taken no previous anticoagulant or antiplatelet                            agents. ASA Grade Assessment: II - A patient with                            mild systemic disease. After reviewing the risks                            and benefits, the patient was deemed in  satisfactory condition to undergo the procedure.                           After obtaining informed consent, the endoscope was                            passed under direct vision. Throughout the                            procedure, the patient's blood pressure, pulse, and                            oxygen saturations were monitored continuously. The                             Endoscope was introduced through the mouth, and                            advanced to the second part of duodenum. The upper                            GI endoscopy was accomplished without difficulty.                            The patient tolerated the procedure well. Scope In: Scope Out: Findings:                 There were esophageal mucosal changes secondary to                            established short-segment Barrett's disease present                            in the distal esophagus. The maximum longitudinal                            extent of these mucosal changes was 1 cm in length                            and noncircumferential. Mucosa was biopsied with a                            cold forceps for histology.                           The stomach was normal except for a moderate                            sliding hiatal hernia.                           The examined duodenum was normal.                           The cardia and gastric fundus were normal  on                            retroflexion. Complications:            No immediate complications. Estimated Blood Loss:     Estimated blood loss: none. Impression:               1. Short segment Barrett's esophagus status post                            biopsies                           2. Hiatal hernia                           3. Otherwise normal EGD                           4. GERD. Recommendation:           1. Continue current medications for GERD                           2. Follow-up biopsies                           3. If no dysplasia identified then repeat EGD in 5                            years                           4. Routine office follow-up with Dr. Henrene Pastor in 1                            year. Sooner if needed. Otherwise resume general                            medical care with your primary provider, Dr. Ronnald Ramp. Docia Chuck. Henrene Pastor, MD 07/20/2019 11:44:58 AM This report has been signed electronically.

## 2019-07-20 NOTE — Op Note (Signed)
[place] Patient Name: Alex [name] Procedure Date: 07/20/2019 11:02 AM MRN: [id number] Endoscopist: Docia Chuck. Henrene Pastor , MD Age: 63 Referring MD:  Date of Birth: 11-06-1955 Gender: Male Account #: [id number] Procedure:                Colonoscopy with cold snare polypectomy x 1 Indications:              Screening for colorectal malignant neoplasm.                            Previous examination (Dr. Sharlett Iles) 2010 was                            negative for neoplasia Medicines:                Monitored Anesthesia Care Procedure:                Pre-Anesthesia Assessment:                           - Prior to the procedure, a History and Physical                            was performed, and patient medications and                            allergies were reviewed. The patient's tolerance of                            previous anesthesia was also reviewed. The risks                            and benefits of the procedure and the sedation                            options and risks were discussed with the patient.                            All questions were answered, and informed consent                            was obtained. Prior Anticoagulants: The patient has                            taken no previous anticoagulant or antiplatelet                            agents. ASA Grade Assessment: II - A patient with                            mild systemic disease. After reviewing the risks                            and benefits, the patient was deemed in  satisfactory condition to undergo the procedure.                           After obtaining informed consent, the colonoscope                            was passed under direct vision. Throughout the                            procedure, the patient's blood pressure, pulse, and                            oxygen saturations were monitored continuously. The                            Colonoscope  was introduced through the anus and                            advanced to the the cecum, identified by                            appendiceal orifice and ileocecal valve. The                            ileocecal valve, appendiceal orifice, and rectum                            were photographed. The quality of the bowel                            preparation was excellent. The colonoscopy was                            performed without difficulty. The patient tolerated                            the procedure well. The bowel preparation used was                            SUPREP via split dose instruction. Scope In: 11:10:35 AM Scope Out: 11:24:48 AM Scope Withdrawal Time: 0 hours 11 minutes 52 seconds  Total Procedure Duration: 0 hours 14 minutes 13 seconds  Findings:                 A 2 mm polyp was found in the rectum. The polyp was                            removed with a cold snare. Resection and retrieval                            were complete.                           Many small and large-mouthed diverticula were found  in the entire colon.                           Internal hemorrhoids were found during                            retroflexion. The hemorrhoids were small.                           The exam was otherwise without abnormality on                            direct and retroflexion views. Complications:            No immediate complications. Estimated blood loss:                            None. Estimated Blood Loss:     Estimated blood loss: none. Impression:               - One 2 mm polyp in the rectum, removed with a cold                            snare. Resected and retrieved.                           - Diverticulosis in the entire examined colon.                           - Internal hemorrhoids.                           - The examination was otherwise normal on direct                            and retroflexion  views. Recommendation:           - Repeat colonoscopy in 7-10 years for surveillance.                           - Patient has a contact number available for                            emergencies. The signs and symptoms of potential                            delayed complications were discussed with the                            patient. Return to normal activities tomorrow.                            Written discharge instructions were provided to the                            patient.                           -  Resume previous diet.                           - Continue present medications.                           - Await pathology results. Docia Chuck. Henrene Pastor, MD 07/20/2019 11:30:22 AM This report has been signed electronically.

## 2019-07-20 NOTE — Progress Notes (Signed)
Temp by Clinton Gallant, vs-Kristen Harden Mo and Margie Ege

## 2019-07-21 ENCOUNTER — Ambulatory Visit: Payer: BLUE CROSS/BLUE SHIELD | Admitting: Adult Health

## 2019-07-22 ENCOUNTER — Telehealth: Payer: Self-pay | Admitting: *Deleted

## 2019-07-22 NOTE — Telephone Encounter (Signed)
  Follow up Call-  Call back number 07/20/2019  Post procedure Call Back phone  # [phone]  Permission to leave phone message No  Some recent data might be hidden     Patient questions:  Do you have a fever, pain , or abdominal swelling? No. Pain Score  0 *  Have you tolerated food without any problems? Yes.    Have you been able to return to your normal activities? Yes.    Do you have any questions about your discharge instructions: Diet   No. Medications  No. Follow up visit  No.  Do you have questions or concerns about your Care? No.  Actions: * If pain score is 4 or above: No action needed, pain <4.  1. Have you developed a fever since your procedure? no  2.   Have you had an respiratory symptoms (SOB or cough) since your procedure? no  3.   Have you tested positive for COVID 19 since your procedure no  4.   Have you had any family members/close contacts diagnosed with the COVID 19 since your procedure?  no   If yes to any of these questions please route to Joylene John, RN and Alphonsa Gin, Therapist, sports.

## 2019-07-28 ENCOUNTER — Other Ambulatory Visit: Payer: Self-pay | Admitting: Internal Medicine

## 2019-07-28 ENCOUNTER — Encounter: Payer: Self-pay | Admitting: Internal Medicine

## 2019-07-28 ENCOUNTER — Other Ambulatory Visit: Payer: Self-pay

## 2019-07-28 ENCOUNTER — Ambulatory Visit (INDEPENDENT_AMBULATORY_CARE_PROVIDER_SITE_OTHER)
Admission: RE | Admit: 2019-07-28 | Discharge: 2019-07-28 | Disposition: A | Discharge status: Home / Self Care | Payer: BC Managed Care – PPO | Source: Ambulatory Visit | Attending: Internal Medicine | Admitting: Internal Medicine

## 2019-07-28 DIAGNOSIS — R9389 Abnormal findings on diagnostic imaging of other specified body structures: Secondary | ICD-10-CM

## 2019-07-28 DIAGNOSIS — R918 Other nonspecific abnormal finding of lung field: Secondary | ICD-10-CM

## 2019-07-28 DIAGNOSIS — I2510 Atherosclerotic heart disease of native coronary artery without angina pectoris: Secondary | ICD-10-CM

## 2019-07-28 DIAGNOSIS — R911 Solitary pulmonary nodule: Secondary | ICD-10-CM | POA: Diagnosis not present

## 2019-07-28 DIAGNOSIS — I2584 Coronary atherosclerosis due to calcified coronary lesion: Secondary | ICD-10-CM | POA: Insufficient documentation

## 2019-08-02 ENCOUNTER — Encounter (HOSPITAL_COMMUNITY): Payer: Self-pay | Admitting: Family Medicine

## 2019-08-18 ENCOUNTER — Ambulatory Visit (INDEPENDENT_AMBULATORY_CARE_PROVIDER_SITE_OTHER): Payer: BC Managed Care – PPO

## 2019-08-18 ENCOUNTER — Other Ambulatory Visit: Payer: Self-pay

## 2019-08-18 DIAGNOSIS — E538 Deficiency of other specified B group vitamins: Secondary | ICD-10-CM | POA: Diagnosis not present

## 2019-08-18 MED ORDER — CYANOCOBALAMIN 1000 MCG/ML IJ SOLN
1000.0000 ug | Freq: Once | INTRAMUSCULAR | Status: AC
  Administered 2019-08-18: 1000 ug via INTRAMUSCULAR

## 2019-08-18 NOTE — Progress Notes (Signed)
Medical screening examination/treatment/procedure(s) were performed by non-physician practitioner and as supervising physician I was immediately available for consultation/collaboration. I agree with above. Mara Favero, MD   

## 2019-09-10 ENCOUNTER — Encounter: Payer: Self-pay | Admitting: Internal Medicine

## 2019-09-13 ENCOUNTER — Telehealth (HOSPITAL_COMMUNITY): Payer: Self-pay

## 2019-09-13 NOTE — Telephone Encounter (Signed)
Attempted to leave instructions, on his answering machine. It hasn't been set up. Will attempt again later.  S.Arif Amendola EMTP

## 2019-09-14 ENCOUNTER — Other Ambulatory Visit: Payer: Self-pay

## 2019-09-14 ENCOUNTER — Ambulatory Visit (HOSPITAL_COMMUNITY): Payer: BC Managed Care – PPO | Attending: Internal Medicine

## 2019-09-14 ENCOUNTER — Encounter: Payer: Self-pay | Admitting: Internal Medicine

## 2019-09-14 DIAGNOSIS — I2510 Atherosclerotic heart disease of native coronary artery without angina pectoris: Secondary | ICD-10-CM | POA: Diagnosis not present

## 2019-09-14 DIAGNOSIS — I2584 Coronary atherosclerosis due to calcified coronary lesion: Secondary | ICD-10-CM | POA: Diagnosis not present

## 2019-09-14 LAB — MYOCARDIAL PERFUSION IMAGING
LV dias vol: 77 mL (ref 62–150)
LV sys vol: 29 mL
Peak HR: 100 {beats}/min
Rest HR: 67 {beats}/min
SDS: 0
SRS: 0
SSS: 0
TID: 1.16

## 2019-09-14 MED ORDER — REGADENOSON 0.4 MG/5ML IV SOLN
0.4000 mg | Freq: Once | INTRAVENOUS | Status: AC
  Administered 2019-09-14: 0.4 mg via INTRAVENOUS

## 2019-09-14 MED ORDER — TECHNETIUM TC 99M TETROFOSMIN IV KIT
10.0000 | PACK | Freq: Once | INTRAVENOUS | Status: AC | PRN
  Administered 2019-09-14: 10 via INTRAVENOUS
  Filled 2019-09-14: qty 10

## 2019-09-14 MED ORDER — TECHNETIUM TC 99M TETROFOSMIN IV KIT
30.8000 | PACK | Freq: Once | INTRAVENOUS | Status: AC | PRN
  Administered 2019-09-14: 30.8 via INTRAVENOUS
  Filled 2019-09-14: qty 31

## 2019-09-20 ENCOUNTER — Other Ambulatory Visit: Payer: Self-pay | Admitting: Internal Medicine

## 2019-09-20 DIAGNOSIS — K7581 Nonalcoholic steatohepatitis (NASH): Secondary | ICD-10-CM

## 2019-09-20 DIAGNOSIS — E118 Type 2 diabetes mellitus with unspecified complications: Secondary | ICD-10-CM

## 2019-09-20 DIAGNOSIS — I10 Essential (primary) hypertension: Secondary | ICD-10-CM

## 2019-09-20 DIAGNOSIS — E876 Hypokalemia: Secondary | ICD-10-CM

## 2019-09-20 MED ORDER — PIOGLITAZONE HCL 15 MG PO TABS: 15.0000 mg | ORAL_TABLET | Freq: Every day | ORAL | 0 refills | Status: DC

## 2019-09-20 MED ORDER — TRIAMTERENE-HCTZ 37.5-25 MG PO CAPS: 1.0000 | ORAL_CAPSULE | Freq: Every day | ORAL | 0 refills | Status: DC

## 2019-09-20 MED ORDER — IRBESARTAN 150 MG PO TABS: 150.0000 mg | ORAL_TABLET | Freq: Every day | ORAL | 0 refills | Status: DC

## 2019-09-21 ENCOUNTER — Other Ambulatory Visit: Payer: Self-pay | Admitting: Internal Medicine

## 2019-09-29 ENCOUNTER — Other Ambulatory Visit: Payer: Self-pay

## 2019-09-29 ENCOUNTER — Ambulatory Visit (INDEPENDENT_AMBULATORY_CARE_PROVIDER_SITE_OTHER): Payer: BC Managed Care – PPO | Admitting: *Deleted

## 2019-09-29 DIAGNOSIS — E538 Deficiency of other specified B group vitamins: Secondary | ICD-10-CM | POA: Diagnosis not present

## 2019-09-29 MED ORDER — CYANOCOBALAMIN 1000 MCG/ML IJ SOLN
1000.0000 ug | Freq: Once | INTRAMUSCULAR | Status: AC
  Administered 2019-09-29: 1000 ug via INTRAMUSCULAR

## 2019-09-29 NOTE — Progress Notes (Signed)
I have reviewed and agree.

## 2019-11-10 ENCOUNTER — Encounter: Payer: Self-pay | Admitting: Internal Medicine

## 2019-11-10 ENCOUNTER — Other Ambulatory Visit: Payer: Self-pay

## 2019-11-10 ENCOUNTER — Ambulatory Visit (INDEPENDENT_AMBULATORY_CARE_PROVIDER_SITE_OTHER): Payer: BC Managed Care – PPO | Admitting: Internal Medicine

## 2019-11-10 VITALS — BP 152/92 | HR 89 | Temp 98.6°F | Ht 68.0 in | Wt 234.0 lb

## 2019-11-10 DIAGNOSIS — D693 Immune thrombocytopenic purpura: Secondary | ICD-10-CM

## 2019-11-10 DIAGNOSIS — E118 Type 2 diabetes mellitus with unspecified complications: Secondary | ICD-10-CM | POA: Diagnosis not present

## 2019-11-10 DIAGNOSIS — K7581 Nonalcoholic steatohepatitis (NASH): Secondary | ICD-10-CM | POA: Diagnosis not present

## 2019-11-10 DIAGNOSIS — I10 Essential (primary) hypertension: Secondary | ICD-10-CM | POA: Diagnosis not present

## 2019-11-10 DIAGNOSIS — E559 Vitamin D deficiency, unspecified: Secondary | ICD-10-CM

## 2019-11-10 DIAGNOSIS — I2510 Atherosclerotic heart disease of native coronary artery without angina pectoris: Secondary | ICD-10-CM

## 2019-11-10 DIAGNOSIS — I2584 Coronary atherosclerosis due to calcified coronary lesion: Secondary | ICD-10-CM

## 2019-11-10 DIAGNOSIS — E538 Deficiency of other specified B group vitamins: Secondary | ICD-10-CM | POA: Diagnosis not present

## 2019-11-10 LAB — BASIC METABOLIC PANEL
BUN: 22 mg/dL (ref 6–23)
CO2: 26 mEq/L (ref 19–32)
Calcium: 9.3 mg/dL (ref 8.4–10.5)
Chloride: 102 mEq/L (ref 96–112)
Creatinine, Ser: 0.98 mg/dL (ref 0.40–1.50)
GFR: 77.38 mL/min (ref >60.00)
Glucose, Bld: 129 mg/dL — ABNORMAL HIGH (ref 70–99)
Potassium: 3.6 mEq/L (ref 3.5–5.1)
Sodium: 137 mEq/L (ref 135–145)

## 2019-11-10 LAB — HEPATIC FUNCTION PANEL
ALT: 69 U/L — ABNORMAL HIGH (ref 0–53)
AST: 36 U/L (ref 0–37)
Albumin: 4.4 g/dL (ref 3.5–5.2)
Alkaline Phosphatase: 62 U/L (ref 39–117)
Bilirubin, Direct: 0.1 mg/dL (ref 0.0–0.3)
Total Bilirubin: 0.6 mg/dL (ref 0.2–1.2)
Total Protein: 7.1 g/dL (ref 6.0–8.3)

## 2019-11-10 LAB — CBC WITH DIFFERENTIAL/PLATELET
Basophils Absolute: 0 10*3/uL (ref 0.0–0.1)
Basophils Relative: 0.5 % (ref 0.0–3.0)
Eosinophils Absolute: 0.1 10*3/uL (ref 0.0–0.7)
Eosinophils Relative: 2.6 % (ref 0.0–5.0)
HCT: 47.7 % (ref 39.0–52.0)
Hemoglobin: 16.4 g/dL (ref 13.0–17.0)
Lymphocytes Relative: 31.4 % (ref 12.0–46.0)
Lymphs Abs: 1.4 10*3/uL (ref 0.7–4.0)
MCHC: 34.4 g/dL (ref 30.0–36.0)
MCV: 96.6 fl (ref 78.0–100.0)
Monocytes Absolute: 0.4 10*3/uL (ref 0.1–1.0)
Monocytes Relative: 9.5 % (ref 3.0–12.0)
Neutro Abs: 2.4 10*3/uL (ref 1.4–7.7)
Neutrophils Relative %: 56 % (ref 43.0–77.0)
Platelets: 140 10*3/uL — ABNORMAL LOW (ref 150.0–400.0)
RBC: 4.94 Mil/uL (ref 4.22–5.81)
RDW: 12.6 % (ref 11.5–15.5)
WBC: 4.4 10*3/uL (ref 4.0–10.5)

## 2019-11-10 LAB — MICROALBUMIN / CREATININE URINE RATIO
Creatinine,U: 138.8 mg/dL
Microalb Creat Ratio: 0.6 mg/g (ref 0.0–30.0)
Microalb, Ur: 0.8 mg/dL (ref 0.0–1.9)

## 2019-11-10 LAB — PROTIME-INR
INR: 1 ratio (ref 0.8–1.0)
Prothrombin Time: 12.2 s (ref 9.6–13.1)

## 2019-11-10 LAB — POCT GLYCOSYLATED HEMOGLOBIN (HGB A1C): Hemoglobin A1C: 5.9 % — AB (ref 4.0–5.6)

## 2019-11-10 MED ORDER — CYANOCOBALAMIN 1000 MCG/ML IJ SOLN: 1000.0000 ug | Freq: Once | INTRAMUSCULAR | Status: DC

## 2019-11-10 MED ORDER — CYANOCOBALAMIN 1000 MCG/ML IJ SOLN
1000.0000 ug | Freq: Once | INTRAMUSCULAR | Status: AC
  Administered 2019-11-10: 1000 ug via INTRAMUSCULAR

## 2019-11-10 MED ORDER — IRBESARTAN 300 MG PO TABS: 300.0000 mg | ORAL_TABLET | Freq: Every day | ORAL | 1 refills | Status: DC

## 2019-11-10 NOTE — Patient Instructions (Signed)

## 2019-11-10 NOTE — Progress Notes (Signed)
Subjective:  Patient ID: Alex [name], male    DOB: 06-Apr-1956  Age: 64 y.o. MRN: [id number]  CC: Hypertension and Diabetes  This visit occurred during the SARS-CoV-2 public health emergency.  Safety protocols were in place, including screening questions prior to the visit, additional usage of staff PPE, and extensive cleaning of exam room while observing appropriate contact time as indicated for disinfecting solutions.    HPI DONALDSON RICHTER presents for f/up - He complains that his blood pressure is not adequately well controlled.  He is active and denies any recent episodes of headache, blurred vision, chest pain, shortness of breath, dyspnea on exertion, edema, or fatigue.  He complains of weight gain.  Outpatient Medications Prior to Visit  Medication Sig Dispense Refill  . Cholecalciferol (VITAMIN D3) 50 MCG (2000 UT) TABS Take 1 tablet by mouth daily. 90 tablet 1  . esomeprazole (NEXIUM) 40 MG capsule TAKE 1 CAPSULE BY MOUTH EVERY MORNING BEFORE BREAKFAST 30 capsule 6  . fexofenadine (ALLEGRA) 180 MG tablet Take 180 mg by mouth daily.      . pioglitazone (ACTOS) 15 MG tablet Take 1 tablet (15 mg total) by mouth daily. 90 tablet 0  . potassium chloride SA (K-DUR) 20 MEQ tablet Take 1 tablet (20 mEq total) by mouth daily. 90 tablet 1  . triamterene-hydrochlorothiazide (DYAZIDE) 37.5-25 MG capsule Take 1 each (1 capsule total) by mouth daily. 90 capsule 0  . valACYclovir (VALTREX) 500 MG tablet Take 500 mg by mouth daily.    . celecoxib (CELEBREX) 200 MG capsule Take 200 mg by mouth 2 (two) times daily.    . irbesartan (AVAPRO) 150 MG tablet Take 1 tablet (150 mg total) by mouth daily. 90 tablet 0   No facility-administered medications prior to visit.    ROS Review of Systems  Constitutional: Positive for unexpected weight change (wt gain). Negative for chills, diaphoresis and fatigue.  HENT: Negative.   Eyes: Negative for visual disturbance.  Respiratory: Negative for  apnea, cough, chest tightness, shortness of breath and wheezing.   Cardiovascular: Negative for chest pain, palpitations and leg swelling.  Gastrointestinal: Negative for abdominal pain, blood in stool, constipation, diarrhea, nausea and vomiting.  Endocrine: Negative.   Genitourinary: Negative.  Negative for difficulty urinating, hematuria and urgency.  Musculoskeletal: Negative.  Negative for arthralgias and myalgias.  Skin: Negative.  Negative for color change and rash.  Neurological: Negative.  Negative for dizziness, weakness, light-headedness, numbness and headaches.  Hematological: Negative for adenopathy. Does not bruise/bleed easily.  Psychiatric/Behavioral: Negative.     Objective:  BP (!) 152/92 (BP Location: Left Arm, Patient Position: Sitting, Cuff Size: Large)   Pulse 89   Temp 98.6 F (37 C) (Oral)   Ht 5' 8"  (1.727 m)   Wt 234 lb (106.1 kg)   SpO2 96%   BMI 35.58 kg/m   BP Readings from Last 3 Encounters:  11/10/19 (!) 152/92  07/20/19 117/74  06/11/19 132/80    Wt Readings from Last 3 Encounters:  11/10/19 234 lb (106.1 kg)  09/14/19 221 lb (100.2 kg)  07/20/19 221 lb (100.2 kg)    Physical Exam  Lab Results  Component Value Date   WBC 4.4 11/10/2019   HGB 16.4 11/10/2019   HCT 47.7 11/10/2019   PLT 140.0 (L) 11/10/2019   GLUCOSE 129 (H) 11/10/2019   CHOL 188 03/25/2019   TRIG 161.0 (H) 03/25/2019   HDL 34.70 (L) 03/25/2019   LDLDIRECT 146.5 12/05/2010   LDLCALC 121 (H)  03/25/2019   ALT 69 (H) 11/10/2019   AST 36 11/10/2019   NA 137 11/10/2019   K 3.6 11/10/2019   CL 102 11/10/2019   CREATININE 0.98 11/10/2019   BUN 22 11/10/2019   CO2 26 11/10/2019   TSH 2.41 03/25/2019   PSA 0.88 03/25/2019   INR 1.0 11/10/2019   HGBA1C 5.9 (A) 11/10/2019   MICROALBUR 0.8 11/10/2019    CT Chest Wo Contrast  Result Date: 07/28/2019 CLINICAL DATA:  Lung nodule, less than 1 cm. EXAM: CT CHEST WITHOUT CONTRAST TECHNIQUE: Multidetector CT imaging of the  chest was performed following the standard protocol without IV contrast. COMPARISON:  CT chest July 10, 2018 FINDINGS: Cardiovascular: Scattered atherosclerosis and signs of coronary artery disease. Extensive coronary artery calcifications including left main and 3 vessels along with right coronary circulation. No pericardial effusion. Central pulmonary arteries are normal caliber. Vessels not well evaluated given lack of contrast. Mediastinum/Nodes: No signs of mediastinal lymphadenopathy. No axillary or hilar lymphadenopathy.  No thoracic inlet adenopathy. Lungs/Pleura: No signs effusion or consolidation. Scattered calcified nodules. No new pulmonary nodules. 4 mm right lower lobe pulmonary nodule unchanged (image 87, series 3) 5 x 5 mm nodule in the left costodiaphragmatic sulcus, stable when measured by this observer on the previous exam. Triangular perifissural opacity right chest along the minor fissure likely small subpleural lymph node slightly larger than on the prior study. Subtle ground-glass without focal characteristics present in the superior segment of the right lower lobe. Airways are patent. Upper Abdomen: Lobular liver contour with signs of fissural retraction/widening. Visualized spleen is likely mildly enlarged between 12 and 13 cm. Musculoskeletal: No signs of acute bone finding or destructive bone process. IMPRESSION: 1. Small approximately 5 mm pulmonary nodules are demonstrated in the lung bases as described. No change with direct measurement comparison. 2. Atherosclerosis and coronary artery disease. 3. Lobular hepatic contour raising the question of liver disease. Clinical and laboratory correlation may be helpful spleen is also at the upper limits of normal to slightly enlarged at approximately 12-13 cm. Aortic Atherosclerosis (ICD10-I70.0). Electronically Signed   By: Zetta Bills M.D.   On: 07/28/2019 10:10    Assessment & Plan:   Ryne was seen today for hypertension and  diabetes.  Diagnoses and all orders for this visit:  B12 deficiency -     cyanocobalamin ((VITAMIN B-12)) injection 1,000 mcg -     CBC with Differential -     Discontinue: cyanocobalamin ((VITAMIN B-12)) injection 1,000 mcg  Essential hypertension- His blood pressure is not adequately well controlled.  He agrees to improve his lifestyle modifications.  I also asked him to double the dose of irbesartan. -     Basic metabolic panel -     Urinalysis, Routine w reflex microscopic -     irbesartan (AVAPRO) 300 MG tablet; Take 1 tablet (300 mg total) by mouth daily.   Nonalcoholic steatohepatitis (NASH)- His LFTs remain mildly elevated.  Will continue the current dose of pioglitazone and I encouraged him to improve his lifestyle modifications. -     Hepatic function panel -     Protime-INR  Type II diabetes mellitus with manifestations ([place]) - His blood sugar is adequately well controlled.  He refused a flu vaccine and pneumonia vaccine today. -     Cancel: Hemoglobin A1c -     Urinalysis, Routine w reflex microscopic -     Microalbumin / creatinine urine ratio -     POCT HgB A1C  Vitamin  D deficiency  Coronary atherosclerosis due to calcified coronary lesion - He has been free from angina.  Will continue to work on risk factor modifications. -     irbesartan (AVAPRO) 300 MG tablet; Take 1 tablet (300 mg total) by mouth daily.  Chronic ITP (idiopathic thrombocytopenia) (HCC) - His platelet count has improved some since he started the B12 replacement therapy but is still mildly low at 140.  He has no complaints of bleeding or bruising.  This is likely ITP and does not require treatment at this time.   I have discontinued Pierce Crane. Reinard "Mike"'s celecoxib and irbesartan. I am also having him start on irbesartan. Additionally, I am having him maintain his fexofenadine, esomeprazole, Vitamin D3, potassium chloride SA, valACYclovir, triamterene-hydrochlorothiazide, and pioglitazone. We  administered cyanocobalamin.  Meds ordered this encounter  Medications  . cyanocobalamin ((VITAMIN B-12)) injection 1,000 mcg  . DISCONTD: cyanocobalamin ((VITAMIN B-12)) injection 1,000 mcg  . irbesartan (AVAPRO) 300 MG tablet    Sig: Take 1 tablet (300 mg total) by mouth daily.    Dispense:  90 tablet    Refill:  1     Follow-up: Return in about 3 months (around 02/08/2020).  Scarlette Calico, MD

## 2019-11-11 ENCOUNTER — Encounter: Payer: Self-pay | Admitting: Internal Medicine

## 2019-11-11 DIAGNOSIS — D693 Immune thrombocytopenic purpura: Secondary | ICD-10-CM | POA: Insufficient documentation

## 2019-11-11 LAB — URINALYSIS, ROUTINE W REFLEX MICROSCOPIC
Bilirubin Urine: NEGATIVE
Hgb urine dipstick: NEGATIVE
Ketones, ur: NEGATIVE
Leukocytes,Ua: NEGATIVE
Nitrite: NEGATIVE
RBC / HPF: NONE SEEN (ref 0–?)
Specific Gravity, Urine: >=1.03 — AB (ref 1.000–1.030)
Total Protein, Urine: NEGATIVE
Urine Glucose: NEGATIVE
Urobilinogen, UA: 0.2 (ref 0.0–1.0)
WBC, UA: NONE SEEN (ref 0–?)
pH: 5.5 (ref 5.0–8.0)

## 2019-11-12 IMAGING — CT CT CHEST W/O CM
2 of 3 series · 15 of 36 positions shown, 18 images · non-contrast
Comparison: CT chest July 10, 2018

CLINICAL DATA: Lung nodule, less than 1 cm.

EXAM:
CT CHEST WITHOUT CONTRAST
TECHNIQUE: Multidetector CT imaging of the chest was performed following the
standard protocol without IV contrast.

[Series 2: thorax · axial · 0.70mm/px · z∈[-312,-84]mm · 12 of 134 slices shown, 15 images]
[im 10/134  mediastinal]
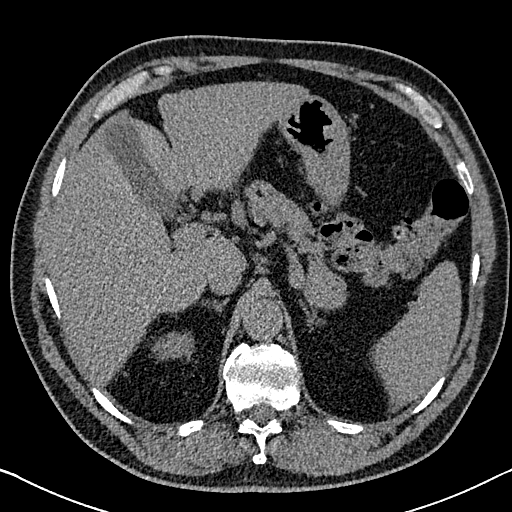
[im 10/134  lung]
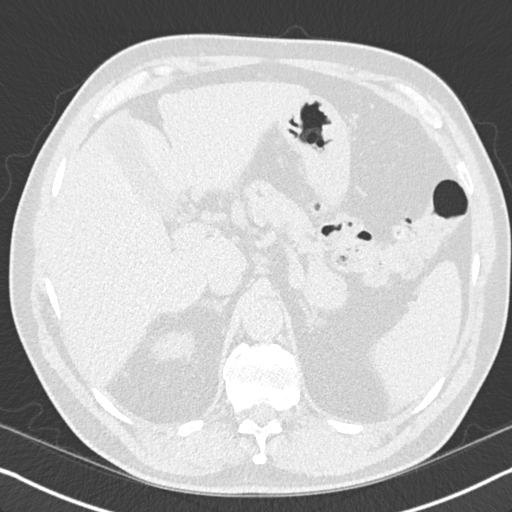
[im 20/134  lung]
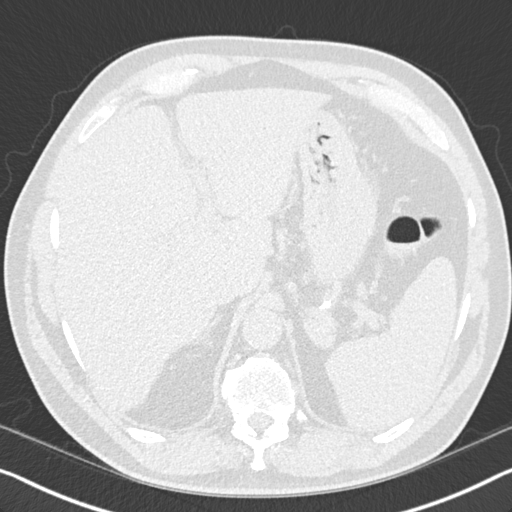
[im 30/134  lung]
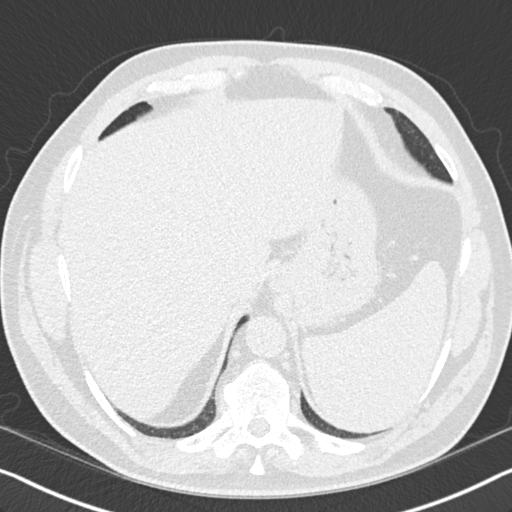
[im 40/134  lung]
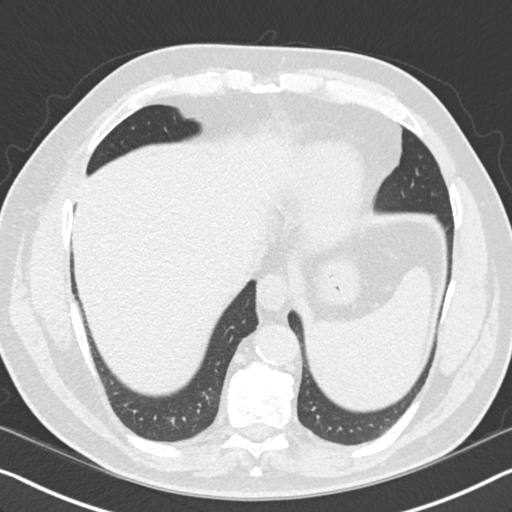
[im 50/134  mediastinal]
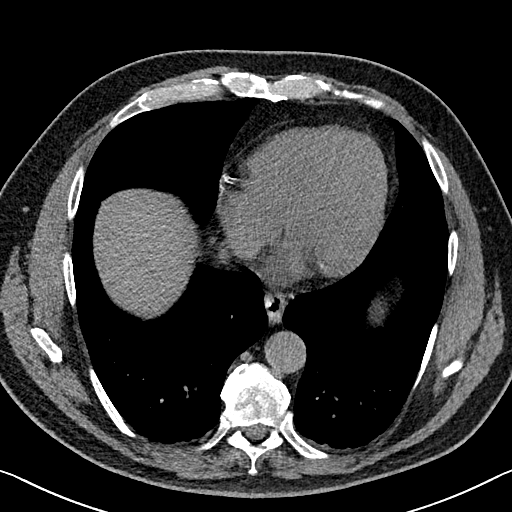
[im 50/134  lung]
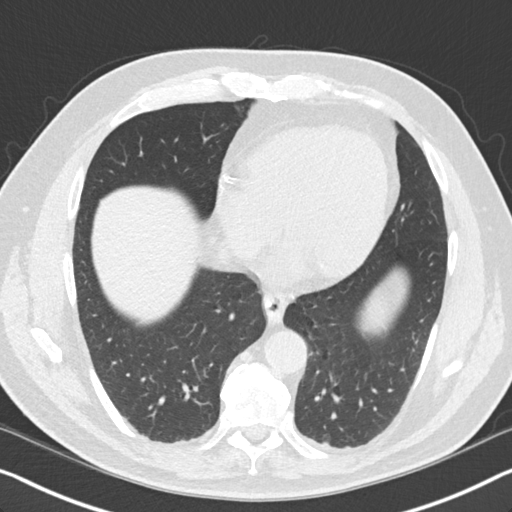
[im 60/134  lung]
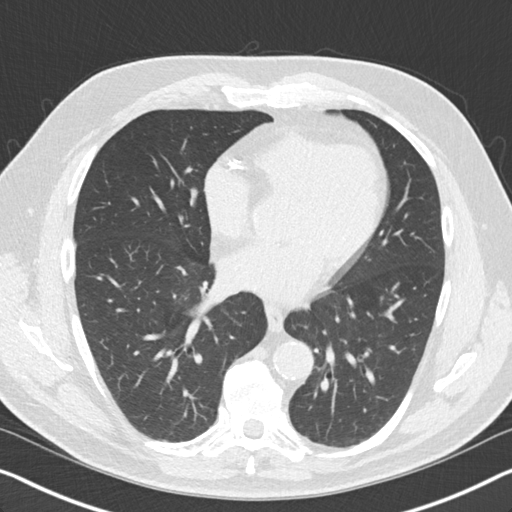
[im 74/134  lung]
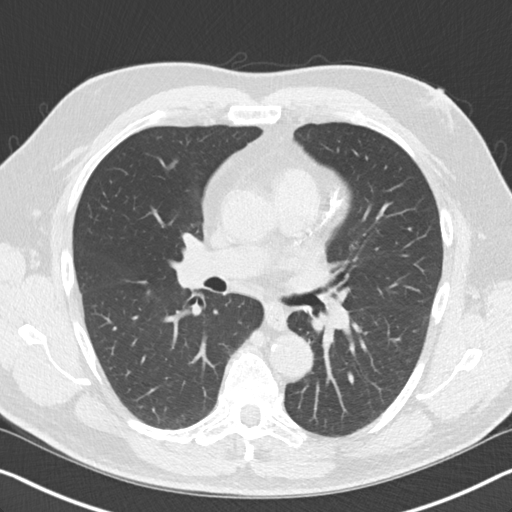
[im 84/134  lung]
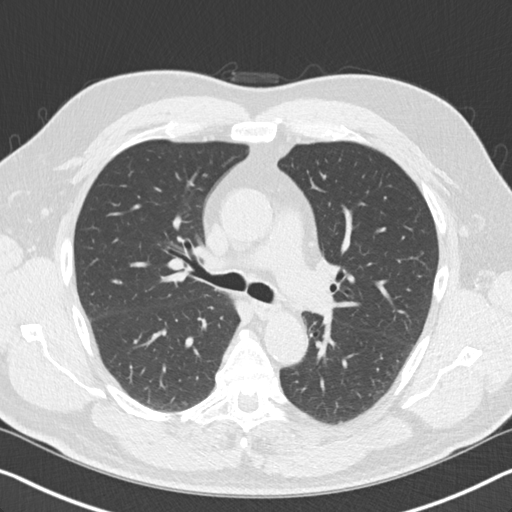
[im 94/134  mediastinal]
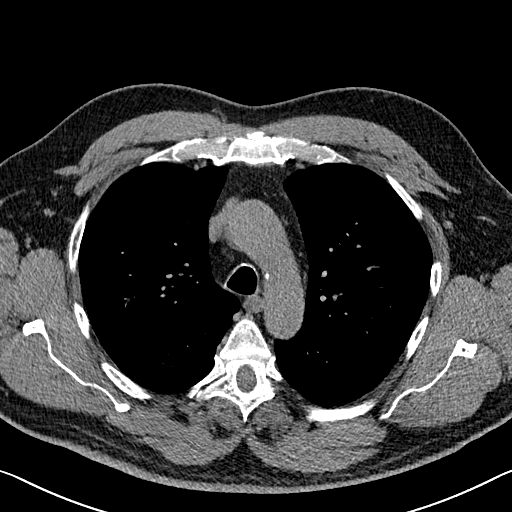
[im 94/134  lung]
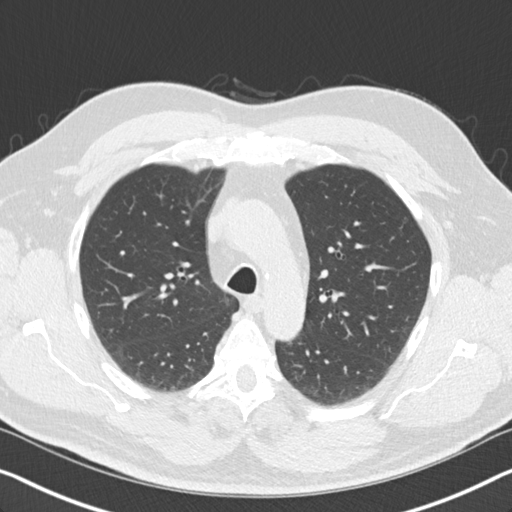
[im 104/134  lung]
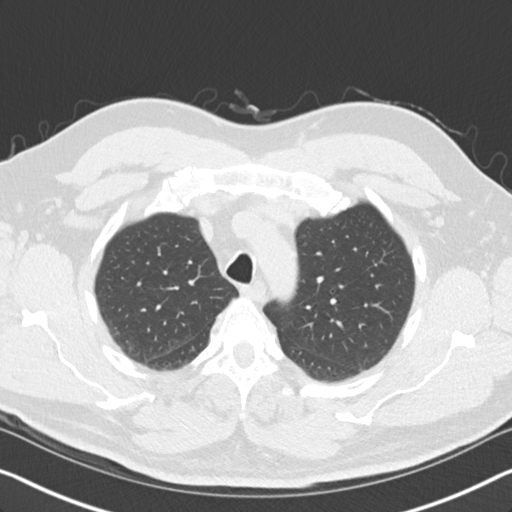
[im 114/134  lung]
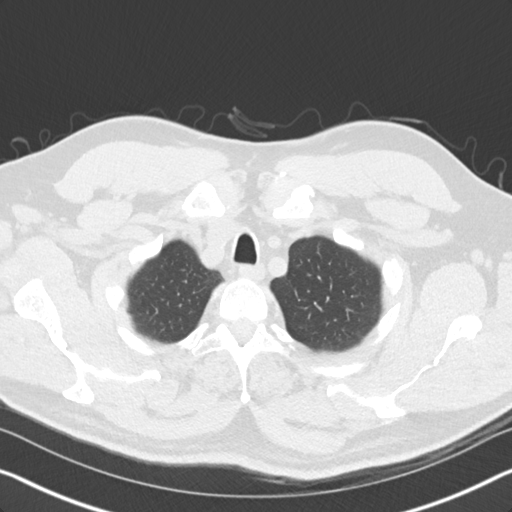
[im 124/134  lung]
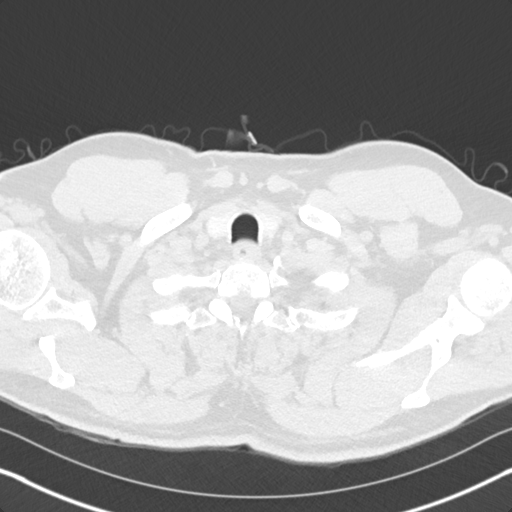

[Series 5: coronal · coronal · 0.54mm/px · 3 of 144 slices shown]
[im 29/144  lung]
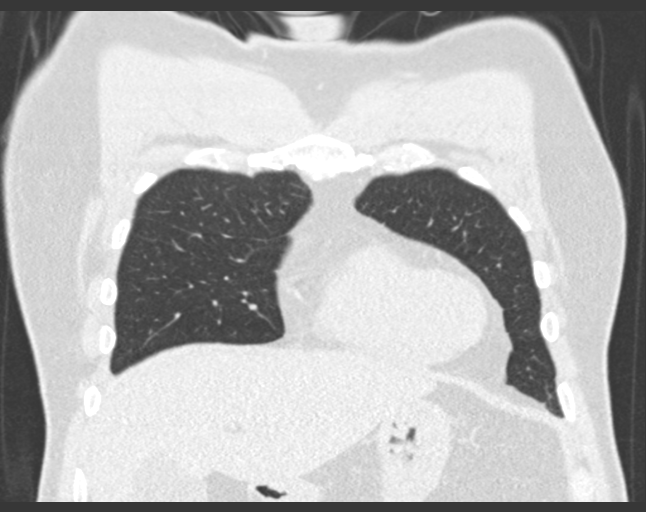
[im 58/144  lung]
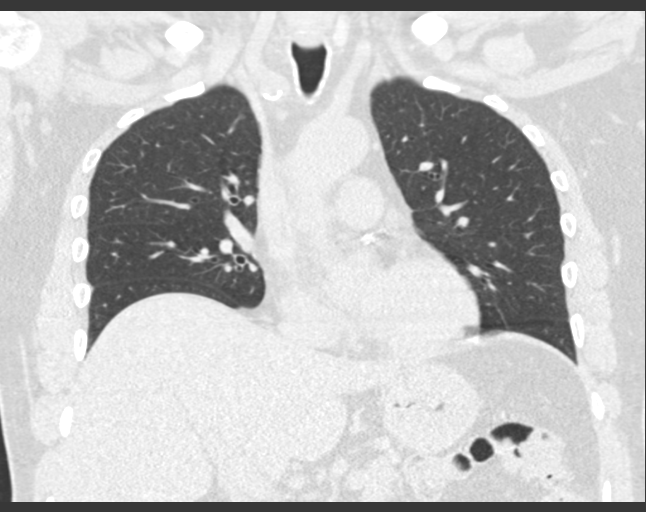
[im 86/144  lung]
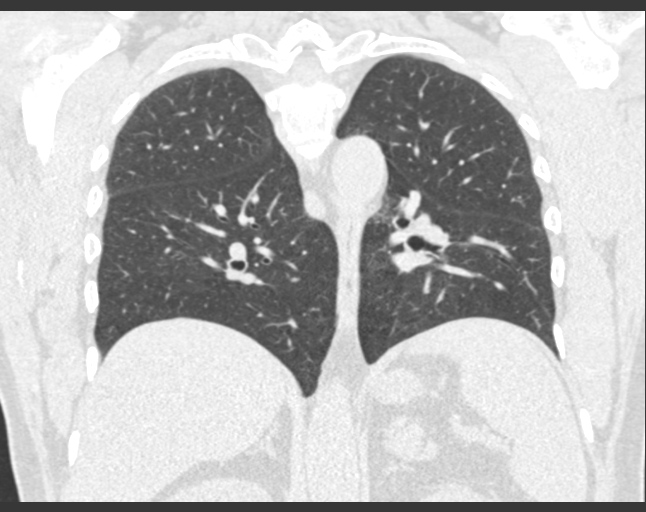

[15 of 36 positions shown; findings below may reference images not displayed]

FINDINGS: Cardiovascular: Scattered atherosclerosis and signs of coronary
artery disease. Extensive coronary artery calcifications including
left main and 3 vessels along with right coronary circulation. No
pericardial effusion. Central pulmonary arteries are normal caliber.
Vessels not well evaluated given lack of contrast.

Mediastinum/Nodes: No signs of mediastinal lymphadenopathy.

No axillary or hilar lymphadenopathy.  No thoracic inlet adenopathy.

Lungs/Pleura: No signs effusion or consolidation. Scattered
calcified nodules. No new pulmonary nodules.

4 mm right lower lobe pulmonary nodule unchanged (image 87, series
[DATE] x 5 mm nodule in the left costodiaphragmatic sulcus, stable when
measured by this observer on the previous exam. Triangular
perifissural opacity right chest along the minor fissure likely
small subpleural lymph node slightly larger than on the prior study.

Subtle ground-glass without focal characteristics present in the
superior segment of the right lower lobe. Airways are patent.

Upper Abdomen: Lobular liver contour with signs of fissural
retraction/widening. Visualized spleen is likely mildly enlarged
between 12 and 13 cm.

Musculoskeletal: No signs of acute bone finding or destructive bone
process.
IMPRESSION: 1. Small approximately 5 mm pulmonary nodules are demonstrated in
the lung bases as described. No change with direct measurement
comparison.
2. Atherosclerosis and coronary artery disease.
3. Lobular hepatic contour raising the question of liver disease.
Clinical and laboratory correlation may be helpful spleen is also at
the upper limits of normal to slightly enlarged at approximately
12-13 cm.

Aortic Atherosclerosis ([id number]-32O.O).

## 2019-11-26 DIAGNOSIS — M7660 Achilles tendinitis, unspecified leg: Secondary | ICD-10-CM | POA: Insufficient documentation

## 2019-11-26 DIAGNOSIS — M67962 Unspecified disorder of synovium and tendon, left lower leg: Secondary | ICD-10-CM | POA: Diagnosis not present

## 2019-12-08 DIAGNOSIS — M67962 Unspecified disorder of synovium and tendon, left lower leg: Secondary | ICD-10-CM | POA: Diagnosis not present

## 2019-12-13 ENCOUNTER — Other Ambulatory Visit: Payer: Self-pay | Admitting: Internal Medicine

## 2019-12-13 DIAGNOSIS — I10 Essential (primary) hypertension: Secondary | ICD-10-CM

## 2019-12-13 DIAGNOSIS — T502X5A Adverse effect of carbonic-anhydrase inhibitors, benzothiadiazides and other diuretics, initial encounter: Secondary | ICD-10-CM

## 2019-12-13 DIAGNOSIS — E876 Hypokalemia: Secondary | ICD-10-CM

## 2019-12-13 MED ORDER — TRIAMTERENE-HCTZ 37.5-25 MG PO CAPS: 1.0000 | ORAL_CAPSULE | Freq: Every day | ORAL | 0 refills | Status: DC

## 2019-12-13 MED ORDER — POTASSIUM CHLORIDE CRYS ER 20 MEQ PO TBCR: 20.0000 meq | EXTENDED_RELEASE_TABLET | Freq: Every day | ORAL | 1 refills | Status: DC

## 2019-12-15 ENCOUNTER — Ambulatory Visit (INDEPENDENT_AMBULATORY_CARE_PROVIDER_SITE_OTHER): Payer: BC Managed Care – PPO | Admitting: *Deleted

## 2019-12-15 ENCOUNTER — Other Ambulatory Visit: Payer: Self-pay

## 2019-12-15 DIAGNOSIS — M67962 Unspecified disorder of synovium and tendon, left lower leg: Secondary | ICD-10-CM | POA: Diagnosis not present

## 2019-12-15 DIAGNOSIS — E538 Deficiency of other specified B group vitamins: Secondary | ICD-10-CM

## 2019-12-15 MED ORDER — CYANOCOBALAMIN 1000 MCG/ML IJ SOLN
1000.0000 ug | Freq: Once | INTRAMUSCULAR | Status: AC
  Administered 2019-12-15: 1000 ug via INTRAMUSCULAR

## 2019-12-15 NOTE — Progress Notes (Signed)
I have reviewed and agree.

## 2019-12-17 ENCOUNTER — Other Ambulatory Visit: Payer: Self-pay | Admitting: Internal Medicine

## 2019-12-17 DIAGNOSIS — E559 Vitamin D deficiency, unspecified: Secondary | ICD-10-CM

## 2019-12-21 ENCOUNTER — Other Ambulatory Visit: Payer: Self-pay | Admitting: Internal Medicine

## 2019-12-21 DIAGNOSIS — K7581 Nonalcoholic steatohepatitis (NASH): Secondary | ICD-10-CM

## 2019-12-21 DIAGNOSIS — E118 Type 2 diabetes mellitus with unspecified complications: Secondary | ICD-10-CM

## 2019-12-22 DIAGNOSIS — M67962 Unspecified disorder of synovium and tendon, left lower leg: Secondary | ICD-10-CM | POA: Diagnosis not present

## 2019-12-29 DIAGNOSIS — M67962 Unspecified disorder of synovium and tendon, left lower leg: Secondary | ICD-10-CM | POA: Diagnosis not present

## 2019-12-31 ENCOUNTER — Ambulatory Visit: Payer: BC Managed Care – PPO | Attending: Internal Medicine

## 2019-12-31 DIAGNOSIS — Z23 Encounter for immunization: Secondary | ICD-10-CM | POA: Insufficient documentation

## 2019-12-31 NOTE — Progress Notes (Signed)
   Covid-19 Vaccination Clinic  Name:  Alex [name]    MRN: [id number] DOB: 05/31/1956  12/31/2019  [name] was observed post Covid-19 immunization for 15 minutes without incident. He was provided with Vaccine Information Sheet and instruction to access the V-Safe system.   [name] was instructed to call 911 with any severe reactions post vaccine: Marland Kitchen Difficulty breathing  . Swelling of face and throat  . A fast heartbeat  . A bad rash all over body  . Dizziness and weakness

## 2020-01-07 DIAGNOSIS — M67962 Unspecified disorder of synovium and tendon, left lower leg: Secondary | ICD-10-CM | POA: Diagnosis not present

## 2020-01-19 ENCOUNTER — Ambulatory Visit (INDEPENDENT_AMBULATORY_CARE_PROVIDER_SITE_OTHER): Payer: BC Managed Care – PPO | Admitting: *Deleted

## 2020-01-19 ENCOUNTER — Other Ambulatory Visit: Payer: Self-pay

## 2020-01-19 DIAGNOSIS — E538 Deficiency of other specified B group vitamins: Secondary | ICD-10-CM

## 2020-01-19 MED ORDER — CYANOCOBALAMIN 1000 MCG/ML IJ SOLN
1000.0000 ug | Freq: Once | INTRAMUSCULAR | Status: AC
  Administered 2020-01-19: 1000 ug via INTRAMUSCULAR

## 2020-01-19 NOTE — Progress Notes (Addendum)
I have reviewed and agree.

## 2020-01-22 ENCOUNTER — Other Ambulatory Visit: Payer: Self-pay | Admitting: Internal Medicine

## 2020-01-24 ENCOUNTER — Telehealth: Payer: Self-pay

## 2020-01-24 MED ORDER — ESOMEPRAZOLE MAGNESIUM 40 MG PO CPDR: DELAYED_RELEASE_CAPSULE | ORAL | 5 refills | Status: AC

## 2020-01-24 NOTE — Telephone Encounter (Signed)
Esomeprazole approved - resent to pharmacy with this information

## 2020-01-24 NOTE — Telephone Encounter (Signed)
Submitted prior authorization through Cover My Meds for Esomeprazole

## 2020-01-31 ENCOUNTER — Ambulatory Visit: Payer: Self-pay

## 2020-02-01 ENCOUNTER — Ambulatory Visit: Payer: BC Managed Care – PPO | Attending: Internal Medicine

## 2020-02-01 DIAGNOSIS — Z23 Encounter for immunization: Secondary | ICD-10-CM

## 2020-02-01 NOTE — Progress Notes (Signed)
   Covid-19 Vaccination Clinic  Name:  JATNIEL VERASTEGUI    MRN: [id number] DOB: November 09, 1955  02/01/2020  Mr. Prins was observed post Covid-19 immunization for 15 minutes without incident. He was provided with Vaccine Information Sheet and instruction to access the V-Safe system.   Mr. Alesi was instructed to call 911 with any severe reactions post vaccine: Marland Kitchen Difficulty breathing  . Swelling of face and throat  . A fast heartbeat  . A bad rash all over body  . Dizziness and weakness   Immunizations Administered    Name Date Dose VIS Date Route   Pfizer COVID-19 Vaccine 02/01/2020  2:49 PM 0.3 mL 10/08/2019 Intramuscular   Manufacturer: [place]   Lot: [id number]   [place]: [address]-0

## 2020-02-23 ENCOUNTER — Ambulatory Visit (INDEPENDENT_AMBULATORY_CARE_PROVIDER_SITE_OTHER): Payer: BC Managed Care – PPO | Admitting: *Deleted

## 2020-02-23 ENCOUNTER — Other Ambulatory Visit: Payer: Self-pay

## 2020-02-23 DIAGNOSIS — E538 Deficiency of other specified B group vitamins: Secondary | ICD-10-CM | POA: Diagnosis not present

## 2020-02-23 LAB — HM DIABETES EYE EXAM

## 2020-02-23 MED ORDER — CYANOCOBALAMIN 1000 MCG/ML IJ SOLN
1000.0000 ug | Freq: Once | INTRAMUSCULAR | Status: AC
  Administered 2020-02-23: 1000 ug via INTRAMUSCULAR

## 2020-02-23 NOTE — Progress Notes (Addendum)
Pls cosign for B12 inj../lmb I have reviewed and agree  

## 2020-03-12 ENCOUNTER — Other Ambulatory Visit: Payer: Self-pay | Admitting: Internal Medicine

## 2020-03-12 DIAGNOSIS — I10 Essential (primary) hypertension: Secondary | ICD-10-CM

## 2020-03-12 DIAGNOSIS — E876 Hypokalemia: Secondary | ICD-10-CM

## 2020-03-28 ENCOUNTER — Encounter: Payer: BC Managed Care – PPO | Admitting: Internal Medicine

## 2020-03-30 ENCOUNTER — Encounter: Payer: Self-pay | Admitting: Internal Medicine

## 2020-03-30 ENCOUNTER — Ambulatory Visit (INDEPENDENT_AMBULATORY_CARE_PROVIDER_SITE_OTHER): Payer: BC Managed Care – PPO | Admitting: Internal Medicine

## 2020-03-30 ENCOUNTER — Other Ambulatory Visit: Payer: Self-pay

## 2020-03-30 VITALS — BP 136/76 | HR 79 | Temp 97.8°F | Resp 16 | Ht 68.0 in | Wt 228.0 lb

## 2020-03-30 DIAGNOSIS — Z0000 Encounter for general adult medical examination without abnormal findings: Secondary | ICD-10-CM | POA: Diagnosis not present

## 2020-03-30 DIAGNOSIS — Z125 Encounter for screening for malignant neoplasm of prostate: Secondary | ICD-10-CM | POA: Diagnosis not present

## 2020-03-30 DIAGNOSIS — I10 Essential (primary) hypertension: Secondary | ICD-10-CM

## 2020-03-30 DIAGNOSIS — E538 Deficiency of other specified B group vitamins: Secondary | ICD-10-CM | POA: Diagnosis not present

## 2020-03-30 DIAGNOSIS — E559 Vitamin D deficiency, unspecified: Secondary | ICD-10-CM

## 2020-03-30 DIAGNOSIS — K7581 Nonalcoholic steatohepatitis (NASH): Secondary | ICD-10-CM | POA: Diagnosis not present

## 2020-03-30 DIAGNOSIS — E118 Type 2 diabetes mellitus with unspecified complications: Secondary | ICD-10-CM

## 2020-03-30 DIAGNOSIS — E785 Hyperlipidemia, unspecified: Secondary | ICD-10-CM

## 2020-03-30 LAB — HEMOGLOBIN A1C: Hgb A1c MFr Bld: 6.3 % (ref 4.6–6.5)

## 2020-03-30 LAB — LIPID PANEL
Cholesterol: 185 mg/dL (ref 0–200)
HDL: 41 mg/dL (ref >39.00)
LDL Cholesterol: 120 mg/dL — ABNORMAL HIGH (ref 0–99)
NonHDL: 144.22
Total CHOL/HDL Ratio: 5
Triglycerides: 121 mg/dL (ref 0.0–149.0)
VLDL: 24.2 mg/dL (ref 0.0–40.0)

## 2020-03-30 LAB — HEPATIC FUNCTION PANEL
ALT: 61 U/L — ABNORMAL HIGH (ref 0–53)
AST: 36 U/L (ref 0–37)
Albumin: 4.6 g/dL (ref 3.5–5.2)
Alkaline Phosphatase: 58 U/L (ref 39–117)
Bilirubin, Direct: 0.2 mg/dL (ref 0.0–0.3)
Total Bilirubin: 1 mg/dL (ref 0.2–1.2)
Total Protein: 7 g/dL (ref 6.0–8.3)

## 2020-03-30 LAB — CBC WITH DIFFERENTIAL/PLATELET
Basophils Absolute: 0.1 10*3/uL (ref 0.0–0.1)
Basophils Relative: 1.4 % (ref 0.0–3.0)
Eosinophils Absolute: 0.1 10*3/uL (ref 0.0–0.7)
Eosinophils Relative: 2.6 % (ref 0.0–5.0)
HCT: 46.8 % (ref 39.0–52.0)
Hemoglobin: 16.1 g/dL (ref 13.0–17.0)
Lymphocytes Relative: 27.5 % (ref 12.0–46.0)
Lymphs Abs: 1.1 10*3/uL (ref 0.7–4.0)
MCHC: 34.4 g/dL (ref 30.0–36.0)
MCV: 95.8 fl (ref 78.0–100.0)
Monocytes Absolute: 0.4 10*3/uL (ref 0.1–1.0)
Monocytes Relative: 10.8 % (ref 3.0–12.0)
Neutro Abs: 2.3 10*3/uL (ref 1.4–7.7)
Neutrophils Relative %: 57.7 % (ref 43.0–77.0)
Platelets: 140 10*3/uL — ABNORMAL LOW (ref 150.0–400.0)
RBC: 4.89 Mil/uL (ref 4.22–5.81)
RDW: 12.9 % (ref 11.5–15.5)
WBC: 4 10*3/uL (ref 4.0–10.5)

## 2020-03-30 LAB — BASIC METABOLIC PANEL
BUN: 23 mg/dL (ref 6–23)
CO2: 25 mEq/L (ref 19–32)
Calcium: 9.6 mg/dL (ref 8.4–10.5)
Chloride: 107 mEq/L (ref 96–112)
Creatinine, Ser: 0.95 mg/dL (ref 0.40–1.50)
GFR: 80.11 mL/min (ref >60.00)
Glucose, Bld: 116 mg/dL — ABNORMAL HIGH (ref 70–99)
Potassium: 4.1 mEq/L (ref 3.5–5.1)
Sodium: 140 mEq/L (ref 135–145)

## 2020-03-30 LAB — PSA: PSA: 0.69 ng/mL (ref 0.10–4.00)

## 2020-03-30 LAB — VITAMIN D 25 HYDROXY (VIT D DEFICIENCY, FRACTURES): VITD: 46.69 ng/mL (ref 30.00–100.00)

## 2020-03-30 LAB — FOLATE: Folate: 14.9 ng/mL (ref >5.9)

## 2020-03-30 MED ORDER — CYANOCOBALAMIN 1000 MCG/ML IJ SOLN
1000.0000 ug | Freq: Once | INTRAMUSCULAR | Status: AC
  Administered 2020-03-30: 1000 ug via INTRAMUSCULAR

## 2020-03-30 NOTE — Patient Instructions (Signed)

## 2020-03-30 NOTE — Progress Notes (Signed)
Subjective:  Patient ID: Alex [name], male    DOB: 10-Aug-1956  Age: 64 y.o. MRN: [id number]  CC: Annual Exam, Hypertension, and Hyperlipidemia   HPI SHARIQ PUIG presents for a CPX.  He has felt well recently and offers no complaints.  He walks long distances and works out on a stationary bike and does not experience CP, DOE, palpitations, edema, or fatigue.  He has been able to lose weight with lifestyle modifications.  Outpatient Medications Prior to Visit  Medication Sig Dispense Refill  . celecoxib (CELEBREX) 200 MG capsule Take 200 mg by mouth daily as needed.    . Cholecalciferol (VITAMIN D3) 50 MCG (2000 UT) TABS TAKE 1 TABLET BY MOUTH DAILY 90 tablet 1  . esomeprazole (NEXIUM) 40 MG capsule TAKE 1 CAPSULE BY MOUTH EVERY MORNING BEFORE BREAKFAST 30 capsule 5  . fexofenadine (ALLEGRA) 180 MG tablet Take 180 mg by mouth daily.      . irbesartan (AVAPRO) 300 MG tablet Take 1 tablet (300 mg total) by mouth daily. 90 tablet 1  . pioglitazone (ACTOS) 15 MG tablet TAKE 1 TABLET(15 MG) BY MOUTH DAILY 90 tablet 1  . potassium chloride SA (KLOR-CON) 20 MEQ tablet Take 1 tablet (20 mEq total) by mouth daily. 90 tablet 1  . triamterene-hydrochlorothiazide (DYAZIDE) 37.5-25 MG capsule TAKE ONE CAPSULE BY MOUTH DAILY 90 capsule 0  . valACYclovir (VALTREX) 500 MG tablet Take 500 mg by mouth daily.     No facility-administered medications prior to visit.    ROS Review of Systems  Constitutional: Negative.  Negative for appetite change, diaphoresis, fatigue and unexpected weight change.  HENT: Negative.   Eyes: Negative.   Respiratory: Negative for cough, chest tightness, shortness of breath and wheezing.   Cardiovascular: Negative for chest pain, palpitations and leg swelling.  Gastrointestinal: Negative for abdominal pain, constipation, diarrhea, nausea and vomiting.  Endocrine: Negative.   Genitourinary: Negative.  Negative for difficulty urinating, scrotal swelling and  testicular pain.  Musculoskeletal: Negative.  Negative for arthralgias and myalgias.  Skin: Negative.  Negative for color change, pallor and rash.  Neurological: Negative.  Negative for dizziness, weakness, light-headedness, numbness and headaches.  Hematological: Negative for adenopathy. Does not bruise/bleed easily.  Psychiatric/Behavioral: Negative.     Objective:  BP 136/76 (BP Location: Left Arm, Patient Position: Sitting, Cuff Size: Large)   Pulse 79   Temp 97.8 F (36.6 C) (Oral)   Resp 16   Ht 5' 8"  (1.727 m)   Wt 228 lb (103.4 kg)   SpO2 94%   BMI 34.67 kg/m   BP Readings from Last 3 Encounters:  03/30/20 136/76  11/10/19 (!) 152/92  07/20/19 117/74    Wt Readings from Last 3 Encounters:  03/30/20 228 lb (103.4 kg)  11/10/19 234 lb (106.1 kg)  09/14/19 221 lb (100.2 kg)    Physical Exam Vitals reviewed.  Constitutional:      Appearance: Normal appearance.  HENT:     Nose: Nose normal.     Mouth/Throat:     Mouth: Mucous membranes are moist.  Eyes:     General: No scleral icterus.    Conjunctiva/sclera: Conjunctivae normal.  Cardiovascular:     Rate and Rhythm: Normal rate and regular rhythm.     Heart sounds: No murmur.  Pulmonary:     Effort: Pulmonary effort is normal.     Breath sounds: No stridor. No wheezing, rhonchi or rales.  Abdominal:     General: Abdomen is protuberant. Bowel sounds  are normal. There is no distension.     Palpations: Abdomen is soft. There is no hepatomegaly, splenomegaly or mass.     Tenderness: There is no abdominal tenderness.  Genitourinary:    Pubic Area: No rash.      Penis: Normal and circumcised.      Testes: Normal.     Prostate: Enlarged (1+ smooth symm BPH). No nodules present.     Rectum: Guaiac result negative. External hemorrhoid and internal hemorrhoid present. No mass, tenderness or anal fissure. Normal anal tone.  Musculoskeletal:        General: Normal range of motion.     Cervical back: Neck supple.      Right lower leg: No edema.     Left lower leg: No edema.  Lymphadenopathy:     Cervical: No cervical adenopathy.  Skin:    General: Skin is warm and dry.     Coloration: Skin is not pale.  Neurological:     General: No focal deficit present.     Mental Status: He is alert and oriented to person, place, and time. Mental status is at baseline.  Psychiatric:        Mood and Affect: Mood normal.        Behavior: Behavior normal.     Lab Results  Component Value Date   WBC 4.0 03/30/2020   HGB 16.1 03/30/2020   HCT 46.8 03/30/2020   PLT 140.0 (L) 03/30/2020   GLUCOSE 116 (H) 03/30/2020   CHOL 185 03/30/2020   TRIG 121.0 03/30/2020   HDL 41.00 03/30/2020   LDLDIRECT 146.5 12/05/2010   LDLCALC 120 (H) 03/30/2020   ALT 61 (H) 03/30/2020   AST 36 03/30/2020   NA 140 03/30/2020   K 4.1 03/30/2020   CL 107 03/30/2020   CREATININE 0.95 03/30/2020   BUN 23 03/30/2020   CO2 25 03/30/2020   TSH 2.41 03/25/2019   PSA 0.69 03/30/2020   INR 1.0 11/10/2019   HGBA1C 6.3 03/30/2020   MICROALBUR 0.8 11/10/2019    CT Chest Wo Contrast  Result Date: 07/28/2019 CLINICAL DATA:  Lung nodule, less than 1 cm. EXAM: CT CHEST WITHOUT CONTRAST TECHNIQUE: Multidetector CT imaging of the chest was performed following the standard protocol without IV contrast. COMPARISON:  CT chest July 10, 2018 FINDINGS: Cardiovascular: Scattered atherosclerosis and signs of coronary artery disease. Extensive coronary artery calcifications including left main and 3 vessels along with right coronary circulation. No pericardial effusion. Central pulmonary arteries are normal caliber. Vessels not well evaluated given lack of contrast. Mediastinum/Nodes: No signs of mediastinal lymphadenopathy. No axillary or hilar lymphadenopathy.  No thoracic inlet adenopathy. Lungs/Pleura: No signs effusion or consolidation. Scattered calcified nodules. No new pulmonary nodules. 4 mm right lower lobe pulmonary nodule unchanged  (image 87, series 3) 5 x 5 mm nodule in the left costodiaphragmatic sulcus, stable when measured by this observer on the previous exam. Triangular perifissural opacity right chest along the minor fissure likely small subpleural lymph node slightly larger than on the prior study. Subtle ground-glass without focal characteristics present in the superior segment of the right lower lobe. Airways are patent. Upper Abdomen: Lobular liver contour with signs of fissural retraction/widening. Visualized spleen is likely mildly enlarged between 12 and 13 cm. Musculoskeletal: No signs of acute bone finding or destructive bone process. IMPRESSION: 1. Small approximately 5 mm pulmonary nodules are demonstrated in the lung bases as described. No change with direct measurement comparison. 2. Atherosclerosis and coronary artery disease. 3.  Lobular hepatic contour raising the question of liver disease. Clinical and laboratory correlation may be helpful spleen is also at the upper limits of normal to slightly enlarged at approximately 12-13 cm. Aortic Atherosclerosis (ICD10-I70.0). Electronically Signed   By: Zetta Bills M.D.   On: 07/28/2019 10:10    Assessment & Plan:   Joeseph was seen today for annual exam, hypertension and hyperlipidemia.  Diagnoses and all orders for this visit:  B12 deficiency- Will continue parenteral B12 replacement therapy. -     cyanocobalamin ((VITAMIN B-12)) injection 1,000 mcg -     CBC with Differential/Platelet; Future -     CBC with Differential/Platelet  Essential hypertension- His blood pressure is well controlled.  Electrolytes and renal function are normal. -     Basic metabolic panel; Future -     Basic metabolic panel  Nonalcoholic steatohepatitis (NASH)- His liver enzymes remain mildly elevated.  Will continue the current dose of pioglitazone and he will continue working on his lifestyle modifications. -     Hepatic function panel; Future -     Hepatic function  panel  Type II diabetes mellitus with manifestations ([place])- His A1c is at 6.3%.  His blood sugars are adequately well controlled. -     Folate; Future -     Hemoglobin A1c; Future -     HM Diabetes Foot Exam -     Hemoglobin A1c -     Folate  Hyperlipidemia with target LDL less than 100- He has achieved his LDL goal and is doing well on the statin.  Routine general medical examination at a health care facility- Exam completed, labs reviewed, vaccines reviewed and updated, cancer screenings are up-to-date, patient education was given. -     Lipid panel; Future -     PSA; Future -     PSA -     Lipid panel  Vitamin D deficiency- His vitamin D level is normal now. -     VITAMIN D 25 Hydroxy (Vit-D Deficiency, Fractures); Future -     VITAMIN D 25 Hydroxy (Vit-D Deficiency, Fractures)   I am having Pierce Crane. [facility]" maintain his fexofenadine, valACYclovir, irbesartan, potassium chloride SA, Vitamin D3, pioglitazone, esomeprazole, triamterene-hydrochlorothiazide, and celecoxib. We administered cyanocobalamin.  Meds ordered this encounter  Medications  . cyanocobalamin ((VITAMIN B-12)) injection 1,000 mcg   In addition to time spent on CPE, I spent 50 minutes in preparing to see the patient by review of recent labs, imaging and procedures, obtaining and reviewing separately obtained history, communicating with the patient and family or caregiver, ordering medications, tests or procedures, and documenting clinical information in the EHR including the differential Dx, treatment, and any further evaluation and other management of 1. B12 deficiency 2. Essential hypertension 3. Nonalcoholic steatohepatitis (NASH) 4. Type II diabetes mellitus with manifestations ([place]) 5. Hyperlipidemia with target LDL less than 100 6. Vitamin D deficiency     Follow-up: Return in about 6 months (around 09/29/2020).  Scarlette Calico, MD

## 2020-05-03 ENCOUNTER — Other Ambulatory Visit: Payer: Self-pay

## 2020-05-03 ENCOUNTER — Ambulatory Visit (INDEPENDENT_AMBULATORY_CARE_PROVIDER_SITE_OTHER): Payer: BC Managed Care – PPO | Admitting: *Deleted

## 2020-05-03 DIAGNOSIS — E538 Deficiency of other specified B group vitamins: Secondary | ICD-10-CM

## 2020-05-03 MED ORDER — CYANOCOBALAMIN 1000 MCG/ML IJ SOLN
1000.0000 ug | Freq: Once | INTRAMUSCULAR | Status: AC
  Administered 2020-05-03: 1000 ug via INTRAMUSCULAR

## 2020-05-03 NOTE — Progress Notes (Signed)
Pls cosign for B12 inj../lmb  

## 2020-05-16 ENCOUNTER — Other Ambulatory Visit: Payer: Self-pay | Admitting: Internal Medicine

## 2020-05-16 DIAGNOSIS — I2510 Atherosclerotic heart disease of native coronary artery without angina pectoris: Secondary | ICD-10-CM

## 2020-05-16 DIAGNOSIS — I10 Essential (primary) hypertension: Secondary | ICD-10-CM

## 2020-05-25 ENCOUNTER — Telehealth: Payer: Self-pay | Admitting: Internal Medicine

## 2020-05-25 NOTE — Telephone Encounter (Signed)
The pt has been advised that he can take OTC nexium if it is more cost effective.  He will take two 20 mg tablets daily.

## 2020-06-07 ENCOUNTER — Other Ambulatory Visit: Payer: Self-pay

## 2020-06-07 ENCOUNTER — Ambulatory Visit (INDEPENDENT_AMBULATORY_CARE_PROVIDER_SITE_OTHER): Payer: BC Managed Care – PPO | Admitting: *Deleted

## 2020-06-07 DIAGNOSIS — E538 Deficiency of other specified B group vitamins: Secondary | ICD-10-CM

## 2020-06-07 MED ORDER — CYANOCOBALAMIN 1000 MCG/ML IJ SOLN
1000.0000 ug | Freq: Once | INTRAMUSCULAR | Status: AC
  Administered 2020-06-07: 1000 ug via INTRAMUSCULAR

## 2020-06-07 NOTE — Progress Notes (Signed)
Pls cosign for B12 inj../lmb  

## 2020-06-10 ENCOUNTER — Other Ambulatory Visit: Payer: Self-pay | Admitting: Internal Medicine

## 2020-06-10 DIAGNOSIS — I10 Essential (primary) hypertension: Secondary | ICD-10-CM

## 2020-06-10 DIAGNOSIS — T502X5A Adverse effect of carbonic-anhydrase inhibitors, benzothiadiazides and other diuretics, initial encounter: Secondary | ICD-10-CM

## 2020-06-10 DIAGNOSIS — E876 Hypokalemia: Secondary | ICD-10-CM

## 2020-06-17 ENCOUNTER — Other Ambulatory Visit: Payer: Self-pay | Admitting: Internal Medicine

## 2020-06-17 DIAGNOSIS — E118 Type 2 diabetes mellitus with unspecified complications: Secondary | ICD-10-CM

## 2020-06-17 DIAGNOSIS — E559 Vitamin D deficiency, unspecified: Secondary | ICD-10-CM

## 2020-06-17 DIAGNOSIS — K7581 Nonalcoholic steatohepatitis (NASH): Secondary | ICD-10-CM

## 2020-06-21 DIAGNOSIS — J343 Hypertrophy of nasal turbinates: Secondary | ICD-10-CM | POA: Diagnosis not present

## 2020-06-21 DIAGNOSIS — K1379 Other lesions of oral mucosa: Secondary | ICD-10-CM | POA: Diagnosis not present

## 2020-07-12 ENCOUNTER — Ambulatory Visit (INDEPENDENT_AMBULATORY_CARE_PROVIDER_SITE_OTHER): Payer: BC Managed Care – PPO

## 2020-07-12 ENCOUNTER — Other Ambulatory Visit: Payer: Self-pay

## 2020-07-12 DIAGNOSIS — E538 Deficiency of other specified B group vitamins: Secondary | ICD-10-CM

## 2020-07-12 MED ORDER — CYANOCOBALAMIN 1000 MCG/ML IJ SOLN
1000.0000 ug | INTRAMUSCULAR | Status: DC
  Administered 2020-07-12 – 2020-11-24 (×4): 1000 ug via INTRAMUSCULAR

## 2020-07-12 NOTE — Progress Notes (Signed)
Pt here for monthly B12 injection per Dr Ronnald Ramp.  B12 1035mg given IM left deltoid and pt tolerated injection well.  Next B12 injection scheduled for 10/20.

## 2020-08-16 ENCOUNTER — Ambulatory Visit: Payer: BC Managed Care – PPO

## 2020-08-17 ENCOUNTER — Ambulatory Visit (INDEPENDENT_AMBULATORY_CARE_PROVIDER_SITE_OTHER): Payer: BC Managed Care – PPO

## 2020-08-17 ENCOUNTER — Other Ambulatory Visit: Payer: Self-pay

## 2020-08-17 DIAGNOSIS — E538 Deficiency of other specified B group vitamins: Secondary | ICD-10-CM | POA: Diagnosis not present

## 2020-08-17 NOTE — Progress Notes (Signed)
Pt here for monthly B12 injection per Dr Ronnald Ramp.  B12 1066mg given IM right deltoid and pt tolerated injection well.  Next B12 injection scheduled for 09/20/20.

## 2020-09-08 ENCOUNTER — Other Ambulatory Visit: Payer: Self-pay | Admitting: Internal Medicine

## 2020-09-08 DIAGNOSIS — I10 Essential (primary) hypertension: Secondary | ICD-10-CM

## 2020-09-08 DIAGNOSIS — E876 Hypokalemia: Secondary | ICD-10-CM

## 2020-09-20 ENCOUNTER — Ambulatory Visit (INDEPENDENT_AMBULATORY_CARE_PROVIDER_SITE_OTHER): Payer: BC Managed Care – PPO

## 2020-09-20 ENCOUNTER — Other Ambulatory Visit: Payer: Self-pay

## 2020-09-20 DIAGNOSIS — E538 Deficiency of other specified B group vitamins: Secondary | ICD-10-CM

## 2020-09-20 NOTE — Progress Notes (Signed)
B12 given and tolerated well °

## 2020-10-02 ENCOUNTER — Telehealth (INDEPENDENT_AMBULATORY_CARE_PROVIDER_SITE_OTHER): Payer: BC Managed Care – PPO | Admitting: Family

## 2020-10-02 ENCOUNTER — Other Ambulatory Visit: Payer: Self-pay

## 2020-10-02 DIAGNOSIS — J019 Acute sinusitis, unspecified: Secondary | ICD-10-CM

## 2020-10-02 MED ORDER — BENZONATATE 200 MG PO CAPS: 200.0000 mg | ORAL_CAPSULE | Freq: Three times a day (TID) | ORAL | 0 refills | Status: DC | PRN

## 2020-10-02 MED ORDER — AMOXICILLIN-POT CLAVULANATE 875-125 MG PO TABS: 1.0000 | ORAL_TABLET | Freq: Two times a day (BID) | ORAL | 0 refills | Status: AC

## 2020-10-02 NOTE — Progress Notes (Signed)
Alex [name] is a 64 y.o. male with the following history as recorded in EpicCare:  Patient Active Problem List   Diagnosis Date Noted  . Chronic ITP (idiopathic thrombocytopenia) (HCC) 11/11/2019  . Coronary atherosclerosis due to calcified coronary lesion 07/28/2019  . Incidental lung nodule, > 34m and < 893m09/05/2019  . Nonalcoholic steatohepatitis (NASH) 03/25/2019  . Type II diabetes mellitus with manifestations ([place]CHaskell05/28/2020  . Vitamin D deficiency 02/10/2018  . Snoring 01/22/2017  . Routine general medical examination at a health care facility 01/17/2016  . Hyperlipidemia with target LDL less than 100 11/23/2009  . GILBERT'S SYNDROME 11/23/2009  . B12 deficiency 10/18/2009  . ALLERGIC RHINITIS 11/16/2008  . BPH associated with nocturia 11/16/2008  . Essential hypertension 09/17/2007  . Homocysteinemia ([place]11/18/2008  . Esophageal reflux 09/15/2007  . Barrett's esophagus 09/15/2007    Current Outpatient Medications  Medication Sig Dispense Refill  . amoxicillin-clavulanate (AUGMENTIN) 875-125 MG tablet Take 1 tablet by mouth 2 (two) times daily for 10 days. 20 tablet 0  . benzonatate (TESSALON) 200 MG capsule Take 1 capsule (200 mg total) by mouth 3 (three) times daily as needed for cough. 30 capsule 0  . celecoxib (CELEBREX) 200 MG capsule Take 200 mg by mouth daily as needed.    . Cholecalciferol (VITAMIN D3) 50 MCG (2000 UT) TABS TAKE 1 TABLET BY MOUTH DAILY 90 tablet 1  . esomeprazole (NEXIUM) 40 MG capsule TAKE 1 CAPSULE BY MOUTH EVERY MORNING BEFORE BREAKFAST 30 capsule 5  . fexofenadine (ALLEGRA) 180 MG tablet Take 180 mg by mouth daily.      . irbesartan (AVAPRO) 300 MG tablet TAKE 1 TABLET(300 MG) BY MOUTH DAILY 90 tablet 1  . pioglitazone (ACTOS) 15 MG tablet TAKE 1 TABLET(15 MG) BY MOUTH DAILY 90 tablet 1  . potassium chloride SA (KLOR-CON) 20 MEQ tablet TAKE 1 TABLET(20 MEQ) BY MOUTH DAILY 90 tablet 1  . triamterene-hydrochlorothiazide (DYAZIDE) 37.5-25  MG capsule TAKE ONE CAPSULE BY MOUTH DAILY 90 capsule 0  . valACYclovir (VALTREX) 500 MG tablet Take 500 mg by mouth daily.     Current Facility-Administered Medications  Medication Dose Route Frequency Provider Last Rate Last Admin  . cyanocobalamin ((VITAMIN B-12)) injection 1,000 mcg  1,000 mcg Intramuscular Q30 days JoJanith LimaMD   1,000 mcg at 09/20/20 1407    Allergies: Diltiazem and Spironolactone  Past Medical History:  Diagnosis Date  . Achilles tendinitis    PMH of  . Allergy   . Arthritis   . B12 deficiency   . Barrett's esophagus   . Diabetes mellitus without complication (HCDe [place]  . Diverticulosis of colon (without mention of hemorrhage) 2010  . Fatty liver 10-16-2011   [place]. GERD (gastroesophageal reflux disease)   . Gilbert syndrome   . Hiatal hernia   . Homocystinemia (HC[place]  . Hyperlipidemia   . Hypertension   . Nonspecific elevation of levels of transaminase or lactic acid dehydrogenase (LDH)    PMH of  . OSA on CPAP   . Sleep apnea     Past Surgical History:  Procedure Laterality Date  . COLONOSCOPY  2010   negative  . KNEE ARTHROSCOPY     R 2015 & L 2016; [facility]  . REPLACEMENT TOTAL KNEE Right 01/15/2018  . REPLACEMENT TOTAL KNEE Left 02/19/2018  . UPPER GASTROINTESTINAL ENDOSCOPY     Dr PaSharlett Ilesmultiple  snce 1992  . VASECTOMY    . WISDOM TOOTH EXTRACTION  Family History  Problem Relation Age of Onset  . Esophageal cancer Father   . Stomach cancer Father   . Hypertension Father   . Heart disease Father        CBAG @ 26  . Hypertension Mother   . Heart attack Mother 42  . Cancer Mother   . Hypertension Sister   . Hypertension Sister   . Hypertension Sister   . Stroke Neg Hx   . Diabetes Neg Hx   . Early death Neg Hx   . Hyperlipidemia Neg Hx   . Kidney disease Neg Hx   . Alcohol abuse Neg Hx   . Rectal cancer Neg Hx     Social History   Tobacco Use  . Smoking status: Never Smoker  . Smokeless tobacco: Never Used   Substance Use Topics  . Alcohol use: No    Subjective:    I connected with Alex [name] on 10/02/20 at  2:00 PM EST by a video enabled telemedicine application and verified that I am speaking with the correct person using two identifiers.   I discussed the limitations of evaluation and management by telemedicine and the availability of in person appointments. The patient expressed understanding and agreed to proceed. Provider in office/ patient is at home; provider and patient are only 2 people on video call.   Concerned for possible sinus infection; cough/ congestion x 1 week; seemed to start after working outside in the yard raking leaves; takes Human resources officer daily; no fever or chest pain or shortness of breath; no difficulty breathing;    Objective:  There were no vitals filed for this visit.  General: Well developed, well nourished, in no acute distress  Head: Normocephalic and atraumatic  Lungs: Respirations unlabored;  Neurologic: Alert and oriented; speech intact; face symmetrical; moves all extremities well; CNII-XII intact without focal deficit   Assessment:  1. Acute sinusitis, recurrence not specified, unspecified location     Plan:  Suspect secondary to allergic reaction; Rx for Augmentin 875 mg bid x 10 days; Rx for Tessalon Perles 200 mg tid prn; increase fluids, rest and follow-up worse, no better.   No follow-ups on file.  No orders of the defined types were placed in this encounter.   Requested Prescriptions   Signed Prescriptions Disp Refills  . amoxicillin-clavulanate (AUGMENTIN) 875-125 MG tablet 20 tablet 0    Sig: Take 1 tablet by mouth 2 (two) times daily for 10 days.  . benzonatate (TESSALON) 200 MG capsule 30 capsule 0    Sig: Take 1 capsule (200 mg total) by mouth 3 (three) times daily as needed for cough.

## 2020-10-23 ENCOUNTER — Ambulatory Visit (INDEPENDENT_AMBULATORY_CARE_PROVIDER_SITE_OTHER): Payer: BC Managed Care – PPO

## 2020-10-23 ENCOUNTER — Other Ambulatory Visit: Payer: Self-pay

## 2020-10-23 DIAGNOSIS — E538 Deficiency of other specified B group vitamins: Secondary | ICD-10-CM

## 2020-10-23 MED ORDER — CYANOCOBALAMIN 1000 MCG/ML IJ SOLN
1000.0000 ug | Freq: Once | INTRAMUSCULAR | Status: AC
Start: 2020-10-23 — End: 2020-10-23
  Administered 2020-10-23: 1000 ug via INTRAMUSCULAR

## 2020-10-23 NOTE — Progress Notes (Signed)
Pt was given Vitamin B12 injection w/o any complications.

## 2020-11-19 ENCOUNTER — Other Ambulatory Visit: Payer: Self-pay | Admitting: Internal Medicine

## 2020-11-19 DIAGNOSIS — I2584 Coronary atherosclerosis due to calcified coronary lesion: Secondary | ICD-10-CM

## 2020-11-19 DIAGNOSIS — I10 Essential (primary) hypertension: Secondary | ICD-10-CM

## 2020-11-19 DIAGNOSIS — I2510 Atherosclerotic heart disease of native coronary artery without angina pectoris: Secondary | ICD-10-CM

## 2020-11-23 ENCOUNTER — Other Ambulatory Visit: Payer: Self-pay

## 2020-11-24 ENCOUNTER — Ambulatory Visit (INDEPENDENT_AMBULATORY_CARE_PROVIDER_SITE_OTHER): Payer: BC Managed Care – PPO

## 2020-11-24 DIAGNOSIS — E538 Deficiency of other specified B group vitamins: Secondary | ICD-10-CM

## 2020-11-24 NOTE — Progress Notes (Signed)
Pt here for monthly B12 injection per Dr Ronnald Ramp.  B12 1078mcg given IM right deltoid and pt tolerated injection well.  Next B12 injection scheduled for 12/27/20.  Dr Quay Burow: please cosign since PCP is out of office.

## 2020-12-07 ENCOUNTER — Other Ambulatory Visit: Payer: Self-pay | Admitting: Internal Medicine

## 2020-12-07 DIAGNOSIS — I10 Essential (primary) hypertension: Secondary | ICD-10-CM

## 2020-12-07 DIAGNOSIS — E876 Hypokalemia: Secondary | ICD-10-CM

## 2020-12-07 DIAGNOSIS — T502X5A Adverse effect of carbonic-anhydrase inhibitors, benzothiadiazides and other diuretics, initial encounter: Secondary | ICD-10-CM

## 2020-12-08 ENCOUNTER — Telehealth: Payer: Self-pay | Admitting: Internal Medicine

## 2020-12-08 DIAGNOSIS — I10 Essential (primary) hypertension: Secondary | ICD-10-CM

## 2020-12-08 DIAGNOSIS — E876 Hypokalemia: Secondary | ICD-10-CM

## 2020-12-08 MED ORDER — TRIAMTERENE-HCTZ 37.5-25 MG PO CAPS: 1.0000 | ORAL_CAPSULE | Freq: Every day | ORAL | 0 refills | Status: DC

## 2020-12-08 NOTE — Telephone Encounter (Signed)
1.Medication Requested: triamterene-hydrochlorothiazide (DYAZIDE) 37.5-25 MG capsule    2. Pharmacy (Name, Street, [place]): [facility] [phone] - [place], [place]  3. On Med List: yes   4. Last Visit with PCP: 6.3.21  5. Next visit date with PCP: 3.3.21   Agent: Please be advised that RX refills may take up to 3 business days. We ask that you follow-up with your pharmacy.

## 2020-12-10 ENCOUNTER — Telehealth: Payer: Self-pay | Admitting: Internal Medicine

## 2020-12-10 DIAGNOSIS — K7581 Nonalcoholic steatohepatitis (NASH): Secondary | ICD-10-CM

## 2020-12-10 DIAGNOSIS — E118 Type 2 diabetes mellitus with unspecified complications: Secondary | ICD-10-CM

## 2020-12-10 DIAGNOSIS — E559 Vitamin D deficiency, unspecified: Secondary | ICD-10-CM

## 2020-12-12 MED ORDER — VITAMIN D3 50 MCG (2000 UT) PO TABS: 1.0000 | ORAL_TABLET | Freq: Every day | ORAL | 1 refills | Status: DC

## 2020-12-12 MED ORDER — PIOGLITAZONE HCL 15 MG PO TABS: ORAL_TABLET | ORAL | 1 refills | Status: DC

## 2020-12-12 NOTE — Addendum Note (Signed)
Addended by: Hinda Kehr on: 12/12/2020 02:30 PM   Modules accepted: Orders

## 2020-12-12 NOTE — Telephone Encounter (Signed)
1.Medication Requested: Cholecalciferol (VITAMIN D3) 50 MCG (2000 UT) TABS  pioglitazone (ACTOS) 15 MG tablet    2. Pharmacy (Name, Street, [place]): [facility] [phone] - [place], [place]  3. On Med List: yes   4. Last Visit with PCP: 6.3.21  5. Next visit date with PCP: 3.3.22  Patient was wondering if he could get these medications refill since he has an upcoming appointment with PCP   Agent: Please be advised that RX refills may take up to 3 business days. We ask that you follow-up with your pharmacy.

## 2020-12-27 ENCOUNTER — Ambulatory Visit: Payer: BC Managed Care – PPO

## 2020-12-28 ENCOUNTER — Ambulatory Visit: Payer: Managed Care, Other (non HMO) | Admitting: Internal Medicine

## 2020-12-28 ENCOUNTER — Other Ambulatory Visit: Payer: Self-pay

## 2020-12-28 ENCOUNTER — Encounter: Payer: Self-pay | Admitting: Internal Medicine

## 2020-12-28 VITALS — BP 146/88 | HR 76 | Temp 98.2°F | Resp 16 | Ht 68.0 in | Wt 231.0 lb

## 2020-12-28 DIAGNOSIS — K7581 Nonalcoholic steatohepatitis (NASH): Secondary | ICD-10-CM

## 2020-12-28 DIAGNOSIS — E785 Hyperlipidemia, unspecified: Secondary | ICD-10-CM

## 2020-12-28 DIAGNOSIS — E118 Type 2 diabetes mellitus with unspecified complications: Secondary | ICD-10-CM

## 2020-12-28 DIAGNOSIS — E538 Deficiency of other specified B group vitamins: Secondary | ICD-10-CM | POA: Diagnosis not present

## 2020-12-28 DIAGNOSIS — I10 Essential (primary) hypertension: Secondary | ICD-10-CM

## 2020-12-28 DIAGNOSIS — D693 Immune thrombocytopenic purpura: Secondary | ICD-10-CM

## 2020-12-28 LAB — BASIC METABOLIC PANEL
BUN: 18 mg/dL (ref 6–23)
CO2: 28 mEq/L (ref 19–32)
Calcium: 9.5 mg/dL (ref 8.4–10.5)
Chloride: 103 mEq/L (ref 96–112)
Creatinine, Ser: 0.89 mg/dL (ref 0.40–1.50)
GFR: 91.14 mL/min (ref >60.00)
Glucose, Bld: 100 mg/dL — ABNORMAL HIGH (ref 70–99)
Potassium: 3.6 mEq/L (ref 3.5–5.1)
Sodium: 138 mEq/L (ref 135–145)

## 2020-12-28 LAB — CBC WITH DIFFERENTIAL/PLATELET
Basophils Absolute: 0 10*3/uL (ref 0.0–0.1)
Basophils Relative: 1.2 % (ref 0.0–3.0)
Eosinophils Absolute: 0.1 10*3/uL (ref 0.0–0.7)
Eosinophils Relative: 2.8 % (ref 0.0–5.0)
HCT: 46 % (ref 39.0–52.0)
Hemoglobin: 16 g/dL (ref 13.0–17.0)
Lymphocytes Relative: 31 % (ref 12.0–46.0)
Lymphs Abs: 1.3 10*3/uL (ref 0.7–4.0)
MCHC: 34.7 g/dL (ref 30.0–36.0)
MCV: 94 fl (ref 78.0–100.0)
Monocytes Absolute: 0.4 10*3/uL (ref 0.1–1.0)
Monocytes Relative: 10.4 % (ref 3.0–12.0)
Neutro Abs: 2.3 10*3/uL (ref 1.4–7.7)
Neutrophils Relative %: 54.6 % (ref 43.0–77.0)
Platelets: 132 10*3/uL — ABNORMAL LOW (ref 150.0–400.0)
RBC: 4.89 Mil/uL (ref 4.22–5.81)
RDW: 12.5 % (ref 11.5–15.5)
WBC: 4.1 10*3/uL (ref 4.0–10.5)

## 2020-12-28 LAB — HEPATIC FUNCTION PANEL
ALT: 59 U/L — ABNORMAL HIGH (ref 0–53)
AST: 39 U/L — ABNORMAL HIGH (ref 0–37)
Albumin: 4.3 g/dL (ref 3.5–5.2)
Alkaline Phosphatase: 58 U/L (ref 39–117)
Bilirubin, Direct: 0.2 mg/dL (ref 0.0–0.3)
Total Bilirubin: 0.9 mg/dL (ref 0.2–1.2)
Total Protein: 7.1 g/dL (ref 6.0–8.3)

## 2020-12-28 LAB — MICROALBUMIN / CREATININE URINE RATIO
Creatinine,U: 154.5 mg/dL
Microalb Creat Ratio: 0.6 mg/g (ref 0.0–30.0)
Microalb, Ur: 0.9 mg/dL (ref 0.0–1.9)

## 2020-12-28 LAB — FOLATE: Folate: 18.7 ng/mL (ref >5.9)

## 2020-12-28 LAB — HEMOGLOBIN A1C: Hgb A1c MFr Bld: 6.8 % — ABNORMAL HIGH (ref 4.6–6.5)

## 2020-12-28 NOTE — Progress Notes (Signed)
Subjective:  Patient ID: Alex [name], male    DOB: 09/27/1956  Age: 65 y.o. MRN: [id number]  CC: Hypertension  This visit occurred during the SARS-CoV-2 public health emergency.  Safety protocols were in place, including screening questions prior to the visit, additional usage of staff PPE, and extensive cleaning of exam room while observing appropriate contact time as indicated for disinfecting solutions.    HPI CLENT DAMORE presents for f/up -  He walks a lot and does not experience CP, DOE, edema, or fatigue.  Outpatient Medications Prior to Visit  Medication Sig Dispense Refill  . celecoxib (CELEBREX) 200 MG capsule Take 200 mg by mouth daily as needed.    . Cholecalciferol (VITAMIN D3) 50 MCG (2000 UT) TABS Take 1 tablet by mouth daily. 90 tablet 1  . esomeprazole (NEXIUM) 40 MG capsule TAKE 1 CAPSULE BY MOUTH EVERY MORNING BEFORE BREAKFAST 30 capsule 5  . fexofenadine (ALLEGRA) 180 MG tablet Take 180 mg by mouth daily.    . irbesartan (AVAPRO) 300 MG tablet TAKE 1 TABLET(300 MG) BY MOUTH DAILY 90 tablet 1  . pioglitazone (ACTOS) 15 MG tablet TAKE 1 TABLET(15 MG) BY MOUTH DAILY 90 tablet 1  . potassium chloride SA (KLOR-CON) 20 MEQ tablet TAKE 1 TABLET(20 MEQ) BY MOUTH DAILY 90 tablet 1  . triamterene-hydrochlorothiazide (DYAZIDE) 37.5-25 MG capsule Take 1 each (1 capsule total) by mouth daily. 90 capsule 0  . benzonatate (TESSALON) 200 MG capsule Take 1 capsule (200 mg total) by mouth 3 (three) times daily as needed for cough. 30 capsule 0  . valACYclovir (VALTREX) 500 MG tablet Take 500 mg by mouth daily.    . cyanocobalamin ((VITAMIN B-12)) injection 1,000 mcg      No facility-administered medications prior to visit.    ROS Review of Systems  Constitutional: Positive for unexpected weight change (wt gain). Negative for chills, diaphoresis and fatigue.  HENT: Negative.   Eyes: Negative for visual disturbance.  Respiratory: Negative for cough, chest tightness,  shortness of breath and wheezing.   Cardiovascular: Negative for chest pain, palpitations and leg swelling.  Gastrointestinal: Negative for abdominal pain, constipation, diarrhea, nausea and vomiting.  Endocrine: Negative.   Genitourinary: Negative.  Negative for difficulty urinating.  Musculoskeletal: Negative.  Negative for arthralgias and myalgias.  Skin: Negative.   Neurological: Negative.  Negative for dizziness, weakness, light-headedness and numbness.  Hematological: Negative for adenopathy. Does not bruise/bleed easily.  Psychiatric/Behavioral: Negative.     Objective:  BP (!) 146/88   Pulse 76   Temp 98.2 F (36.8 C) (Oral)   Resp 16   Ht 5\' 8"  (1.727 m)   Wt 231 lb (104.8 kg)   SpO2 96%   BMI 35.12 kg/m   BP Readings from Last 3 Encounters:  12/28/20 (!) 146/88  03/30/20 136/76  11/10/19 (!) 152/92    Wt Readings from Last 3 Encounters:  12/28/20 231 lb (104.8 kg)  03/30/20 228 lb (103.4 kg)  11/10/19 234 lb (106.1 kg)    Physical Exam Vitals reviewed.  Constitutional:      Appearance: Normal appearance.  HENT:     Nose: Nose normal.     Mouth/Throat:     Mouth: Mucous membranes are moist.  Eyes:     General: No scleral icterus.    Conjunctiva/sclera: Conjunctivae normal.  Cardiovascular:     Rate and Rhythm: Normal rate and regular rhythm.     Heart sounds: No murmur heard.   Pulmonary:  Effort: Pulmonary effort is normal.     Breath sounds: No stridor. No wheezing, rhonchi or rales.  Abdominal:     General: Abdomen is protuberant. Bowel sounds are normal. There is no distension.     Palpations: Abdomen is soft. There is no hepatomegaly, splenomegaly or mass.     Tenderness: There is no abdominal tenderness.  Musculoskeletal:        General: Normal range of motion.     Cervical back: Neck supple.     Right lower leg: No edema.     Left lower leg: No edema.  Lymphadenopathy:     Cervical: No cervical adenopathy.  Skin:    General: Skin  is warm and dry.     Coloration: Skin is not pale.  Neurological:     General: No focal deficit present.     Mental Status: He is alert and oriented to person, place, and time. Mental status is at baseline.  Psychiatric:        Mood and Affect: Mood normal.        Behavior: Behavior normal.     Lab Results  Component Value Date   WBC 4.1 12/28/2020   HGB 16.0 12/28/2020   HCT 46.0 12/28/2020   PLT 132.0 (L) 12/28/2020   GLUCOSE 100 (H) 12/28/2020   CHOL 185 03/30/2020   TRIG 121.0 03/30/2020   HDL 41.00 03/30/2020   LDLDIRECT 146.5 12/05/2010   LDLCALC 120 (H) 03/30/2020   ALT 59 (H) 12/28/2020   AST 39 (H) 12/28/2020   NA 138 12/28/2020   K 3.6 12/28/2020   CL 103 12/28/2020   CREATININE 0.89 12/28/2020   BUN 18 12/28/2020   CO2 28 12/28/2020   TSH 2.41 03/25/2019   PSA 0.69 03/30/2020   INR 1.0 11/10/2019   HGBA1C 6.8 (H) 12/28/2020   MICROALBUR 0.9 12/28/2020    CT Chest Wo Contrast  Result Date: 07/28/2019 CLINICAL DATA:  Lung nodule, less than 1 cm. EXAM: CT CHEST WITHOUT CONTRAST TECHNIQUE: Multidetector CT imaging of the chest was performed following the standard protocol without IV contrast. COMPARISON:  CT chest July 10, 2018 FINDINGS: Cardiovascular: Scattered atherosclerosis and signs of coronary artery disease. Extensive coronary artery calcifications including left main and 3 vessels along with right coronary circulation. No pericardial effusion. Central pulmonary arteries are normal caliber. Vessels not well evaluated given lack of contrast. Mediastinum/Nodes: No signs of mediastinal lymphadenopathy. No axillary or hilar lymphadenopathy.  No thoracic inlet adenopathy. Lungs/Pleura: No signs effusion or consolidation. Scattered calcified nodules. No new pulmonary nodules. 4 mm right lower lobe pulmonary nodule unchanged (image 87, series 3) 5 x 5 mm nodule in the left costodiaphragmatic sulcus, stable when measured by this observer on the previous exam.  Triangular perifissural opacity right chest along the minor fissure likely small subpleural lymph node slightly larger than on the prior study. Subtle ground-glass without focal characteristics present in the superior segment of the right lower lobe. Airways are patent. Upper Abdomen: Lobular liver contour with signs of fissural retraction/widening. Visualized spleen is likely mildly enlarged between 12 and 13 cm. Musculoskeletal: No signs of acute bone finding or destructive bone process. IMPRESSION: 1. Small approximately 5 mm pulmonary nodules are demonstrated in the lung bases as described. No change with direct measurement comparison. 2. Atherosclerosis and coronary artery disease. 3. Lobular hepatic contour raising the question of liver disease. Clinical and laboratory correlation may be helpful spleen is also at the upper limits of normal to slightly enlarged at  approximately 12-13 cm. Aortic Atherosclerosis (ICD10-I70.0). Electronically Signed   By: Zetta Bills M.D.   On: 07/28/2019 10:10    Assessment & Plan:   Saben was seen today for hypertension.  Diagnoses and all orders for this visit:  Essential hypertension- His BP is not well controlled. He will improve lifestyle modifications. -     CBC with Differential/Platelet; Future -     Basic metabolic panel; Future -     Basic metabolic panel -     CBC with Differential/Platelet  Nonalcoholic steatohepatitis (NASH)- His LFTs remain elevated. I have asked him to lose weight. -     Hepatic function panel; Future -     Hepatic function panel  Type II diabetes mellitus with manifestations ([place])- His blood sugar is adequately well controlled. -     Basic metabolic panel; Future -     Microalbumin / creatinine urine ratio; Future -     Hemoglobin A1c; Future -     Hemoglobin A1c -     Microalbumin / creatinine urine ratio -     Basic metabolic panel  Chronic ITP (idiopathic thrombocytopenia) (HCC)- His PLT ct is stable. -     CBC  with Differential/Platelet; Future -     Folate; Future -     Folate -     CBC with Differential/Platelet  B12 deficiency -     CBC with Differential/Platelet; Future -     Folate; Future -     Folate -     CBC with Differential/Platelet  Hyperlipidemia with target LDL less than 100 -     Pitavastatin Calcium (LIVALO) 4 MG TABS; Take 1 tablet (4 mg total) by mouth daily.   I have discontinued Pierce Crane. [facility]"'s valACYclovir and benzonatate. I am also having him start on Livalo. Additionally, I am having him maintain his fexofenadine, esomeprazole, celecoxib, potassium chloride SA, irbesartan, triamterene-hydrochlorothiazide, Vitamin D3, and pioglitazone. We will stop administering cyanocobalamin.  Meds ordered this encounter  Medications  . Pitavastatin Calcium (LIVALO) 4 MG TABS    Sig: Take 1 tablet (4 mg total) by mouth daily.    Dispense:  90 tablet    Refill:  1     Follow-up: Return in about 3 months (around 03/30/2021).  Scarlette Calico, MD

## 2020-12-28 NOTE — Patient Instructions (Signed)

## 2020-12-29 MED ORDER — LIVALO 4 MG PO TABS: 1.0000 | ORAL_TABLET | Freq: Every day | ORAL | 1 refills | Status: DC

## 2021-01-03 ENCOUNTER — Telehealth: Payer: Self-pay

## 2021-01-03 NOTE — Telephone Encounter (Signed)
[employer]  Approved 03/09/202-10/27/2098

## 2021-01-09 ENCOUNTER — Other Ambulatory Visit: Payer: Self-pay | Admitting: Internal Medicine

## 2021-01-09 DIAGNOSIS — I10 Essential (primary) hypertension: Secondary | ICD-10-CM

## 2021-01-09 DIAGNOSIS — T502X5A Adverse effect of carbonic-anhydrase inhibitors, benzothiadiazides and other diuretics, initial encounter: Secondary | ICD-10-CM

## 2021-01-09 DIAGNOSIS — E876 Hypokalemia: Secondary | ICD-10-CM

## 2021-01-09 NOTE — Telephone Encounter (Signed)
Patient would like to know the reason he was prescribed Pitavastatin Calcium (LIVALO) 4 MG TABS  Please call

## 2021-01-11 ENCOUNTER — Encounter: Payer: Self-pay | Admitting: Internal Medicine

## 2021-01-23 LAB — HM DIABETES EYE EXAM

## 2021-01-31 ENCOUNTER — Ambulatory Visit: Payer: Managed Care, Other (non HMO)

## 2021-02-02 ENCOUNTER — Other Ambulatory Visit: Payer: Self-pay

## 2021-02-02 ENCOUNTER — Ambulatory Visit (INDEPENDENT_AMBULATORY_CARE_PROVIDER_SITE_OTHER): Payer: Managed Care, Other (non HMO)

## 2021-02-02 DIAGNOSIS — E538 Deficiency of other specified B group vitamins: Secondary | ICD-10-CM

## 2021-02-02 MED ORDER — CYANOCOBALAMIN 1000 MCG/ML IJ SOLN
1000.0000 ug | INTRAMUSCULAR | Status: DC
  Administered 2021-02-02 – 2021-03-08 (×2): 1000 ug via INTRAMUSCULAR

## 2021-02-02 NOTE — Progress Notes (Signed)
Pt here for monthly B12 injection per Dr. Ronnald Ramp  B12 1032mg given IM left arm, and pt tolerated injection well.  Next B12 injection scheduled for 03/08/21.

## 2021-02-21 ENCOUNTER — Other Ambulatory Visit: Payer: Self-pay | Admitting: Internal Medicine

## 2021-02-21 DIAGNOSIS — I10 Essential (primary) hypertension: Secondary | ICD-10-CM

## 2021-02-21 DIAGNOSIS — I2584 Coronary atherosclerosis due to calcified coronary lesion: Secondary | ICD-10-CM

## 2021-02-21 DIAGNOSIS — I2510 Atherosclerotic heart disease of native coronary artery without angina pectoris: Secondary | ICD-10-CM

## 2021-03-08 ENCOUNTER — Ambulatory Visit (INDEPENDENT_AMBULATORY_CARE_PROVIDER_SITE_OTHER): Payer: Managed Care, Other (non HMO)

## 2021-03-08 ENCOUNTER — Other Ambulatory Visit: Payer: Self-pay

## 2021-03-08 DIAGNOSIS — E538 Deficiency of other specified B group vitamins: Secondary | ICD-10-CM | POA: Diagnosis not present

## 2021-03-08 NOTE — Progress Notes (Signed)
Pt here for monthly B12 injection per Dr Ronnald Ramp.  B12 1076mg given IM right deltoid and pt tolerated injection well.  Next B12 injection scheduled for 04/12/21.

## 2021-03-10 ENCOUNTER — Other Ambulatory Visit: Payer: Self-pay | Admitting: Internal Medicine

## 2021-03-10 DIAGNOSIS — I10 Essential (primary) hypertension: Secondary | ICD-10-CM

## 2021-03-10 DIAGNOSIS — E876 Hypokalemia: Secondary | ICD-10-CM

## 2021-04-05 ENCOUNTER — Encounter: Payer: Self-pay | Admitting: Internal Medicine

## 2021-04-05 ENCOUNTER — Other Ambulatory Visit: Payer: Self-pay

## 2021-04-05 ENCOUNTER — Ambulatory Visit (INDEPENDENT_AMBULATORY_CARE_PROVIDER_SITE_OTHER): Payer: Managed Care, Other (non HMO) | Admitting: Internal Medicine

## 2021-04-05 VITALS — BP 144/82 | HR 79 | Temp 98.0°F | Resp 16 | Ht 70.0 in | Wt 226.0 lb

## 2021-04-05 DIAGNOSIS — E118 Type 2 diabetes mellitus with unspecified complications: Secondary | ICD-10-CM | POA: Diagnosis not present

## 2021-04-05 DIAGNOSIS — R351 Nocturia: Secondary | ICD-10-CM

## 2021-04-05 DIAGNOSIS — K7581 Nonalcoholic steatohepatitis (NASH): Secondary | ICD-10-CM

## 2021-04-05 DIAGNOSIS — I10 Essential (primary) hypertension: Secondary | ICD-10-CM | POA: Diagnosis not present

## 2021-04-05 DIAGNOSIS — N401 Enlarged prostate with lower urinary tract symptoms: Secondary | ICD-10-CM | POA: Diagnosis not present

## 2021-04-05 DIAGNOSIS — I2510 Atherosclerotic heart disease of native coronary artery without angina pectoris: Secondary | ICD-10-CM

## 2021-04-05 DIAGNOSIS — E538 Deficiency of other specified B group vitamins: Secondary | ICD-10-CM | POA: Diagnosis not present

## 2021-04-05 DIAGNOSIS — D693 Immune thrombocytopenic purpura: Secondary | ICD-10-CM

## 2021-04-05 DIAGNOSIS — I2584 Coronary atherosclerosis due to calcified coronary lesion: Secondary | ICD-10-CM

## 2021-04-05 DIAGNOSIS — E785 Hyperlipidemia, unspecified: Secondary | ICD-10-CM | POA: Diagnosis not present

## 2021-04-05 DIAGNOSIS — Z0000 Encounter for general adult medical examination without abnormal findings: Secondary | ICD-10-CM

## 2021-04-05 LAB — CBC WITH DIFFERENTIAL/PLATELET
Basophils Absolute: 0.1 10*3/uL (ref 0.0–0.1)
Basophils Relative: 1.6 % (ref 0.0–3.0)
Eosinophils Absolute: 0.1 10*3/uL (ref 0.0–0.7)
Eosinophils Relative: 1.6 % (ref 0.0–5.0)
HCT: 47.3 % (ref 39.0–52.0)
Hemoglobin: 16.3 g/dL (ref 13.0–17.0)
Lymphocytes Relative: 24 % (ref 12.0–46.0)
Lymphs Abs: 0.9 10*3/uL (ref 0.7–4.0)
MCHC: 34.4 g/dL (ref 30.0–36.0)
MCV: 94.2 fl (ref 78.0–100.0)
Monocytes Absolute: 0.4 10*3/uL (ref 0.1–1.0)
Monocytes Relative: 11.3 % (ref 3.0–12.0)
Neutro Abs: 2.4 10*3/uL (ref 1.4–7.7)
Neutrophils Relative %: 61.5 % (ref 43.0–77.0)
Platelets: 132 10*3/uL — ABNORMAL LOW (ref 150.0–400.0)
RBC: 5.02 Mil/uL (ref 4.22–5.81)
RDW: 12.7 % (ref 11.5–15.5)
WBC: 3.9 10*3/uL — ABNORMAL LOW (ref 4.0–10.5)

## 2021-04-05 LAB — LIPID PANEL
Cholesterol: 177 mg/dL (ref 0–200)
HDL: 42 mg/dL (ref >39.00)
LDL Cholesterol: 109 mg/dL — ABNORMAL HIGH (ref 0–99)
NonHDL: 135.23
Total CHOL/HDL Ratio: 4
Triglycerides: 129 mg/dL (ref 0.0–149.0)
VLDL: 25.8 mg/dL (ref 0.0–40.0)

## 2021-04-05 LAB — PSA: PSA: 0.62 ng/mL (ref 0.10–4.00)

## 2021-04-05 LAB — BASIC METABOLIC PANEL
BUN: 26 mg/dL — ABNORMAL HIGH (ref 6–23)
CO2: 27 mEq/L (ref 19–32)
Calcium: 9.4 mg/dL (ref 8.4–10.5)
Chloride: 102 mEq/L (ref 96–112)
Creatinine, Ser: 0.9 mg/dL (ref 0.40–1.50)
GFR: 90.66 mL/min (ref >60.00)
Glucose, Bld: 139 mg/dL — ABNORMAL HIGH (ref 70–99)
Potassium: 3.9 mEq/L (ref 3.5–5.1)
Sodium: 138 mEq/L (ref 135–145)

## 2021-04-05 LAB — HEMOGLOBIN A1C: Hgb A1c MFr Bld: 6.7 % — ABNORMAL HIGH (ref 4.6–6.5)

## 2021-04-05 LAB — TSH: TSH: 2.84 u[IU]/mL (ref 0.35–4.50)

## 2021-04-05 MED ORDER — LIVALO 4 MG PO TABS: 1.0000 | ORAL_TABLET | Freq: Every day | ORAL | 1 refills | Status: DC

## 2021-04-05 NOTE — Progress Notes (Signed)
Subjective:  Patient ID: Alex [name], male    DOB: 16-Nov-1955  Age: 65 y.o. MRN: [id number]  CC: Annual Exam, Hypertension, Hyperlipidemia, and Diabetes  This visit occurred during the SARS-CoV-2 public health emergency.  Safety protocols were in place, including screening questions prior to the visit, additional usage of staff PPE, and extensive cleaning of exam room while observing appropriate contact time as indicated for disinfecting solutions.    HPI Alex [name] presents for a CPX and f/up -   He is very active, walks a lot, and does not experience CP, DOE, diaphoresis, dizziness, lightheadedness, palpitations, edema, or fatigue.  He has lost weight with lifestyle modifications.  Outpatient Medications Prior to Visit  Medication Sig Dispense Refill   celecoxib (CELEBREX) 200 MG capsule Take 200 mg by mouth daily as needed.     Cholecalciferol (VITAMIN D3) 50 MCG (2000 UT) TABS Take 1 tablet by mouth daily. 90 tablet 1   esomeprazole (NEXIUM) 40 MG capsule TAKE 1 CAPSULE BY MOUTH EVERY MORNING BEFORE BREAKFAST 30 capsule 5   fexofenadine (ALLEGRA) 180 MG tablet Take 180 mg by mouth daily.     irbesartan (AVAPRO) 300 MG tablet TAKE 1 TABLET(300 MG) BY MOUTH DAILY 90 tablet 1   potassium chloride SA (KLOR-CON) 20 MEQ tablet TAKE 1 TABLET(20 MEQ) BY MOUTH DAILY 90 tablet 1   triamterene-hydrochlorothiazide (DYAZIDE) 37.5-25 MG capsule TAKE 1 CAPSULE BY MOUTH DAILY 90 capsule 0   pioglitazone (ACTOS) 15 MG tablet TAKE 1 TABLET(15 MG) BY MOUTH DAILY 90 tablet 1   Pitavastatin Calcium (LIVALO) 4 MG TABS Take 1 tablet (4 mg total) by mouth daily. (Patient not taking: Reported on 04/05/2021) 90 tablet 1   cyanocobalamin ((VITAMIN B-12)) injection 1,000 mcg      No facility-administered medications prior to visit.    ROS Review of Systems  Constitutional:  Negative for diaphoresis and unexpected weight change.  HENT: Negative.    Eyes:  Negative for visual disturbance.   Respiratory:  Negative for cough, chest tightness, shortness of breath and wheezing.   Cardiovascular:  Negative for chest pain, palpitations and leg swelling.  Gastrointestinal:  Negative for abdominal pain, constipation, diarrhea, nausea and vomiting.  Endocrine: Negative.   Genitourinary: Negative.  Negative for difficulty urinating, scrotal swelling and testicular pain.  Musculoskeletal:  Negative for arthralgias, joint swelling and myalgias.  Skin: Negative.  Negative for color change and pallor.  Neurological: Negative.  Negative for dizziness, weakness, light-headedness and numbness.  Hematological:  Negative for adenopathy. Does not bruise/bleed easily.  Psychiatric/Behavioral: Negative.     Objective:  BP (!) 144/82 (BP Location: Right Arm, Patient Position: Sitting, Cuff Size: Large)   Pulse 79   Temp 98 F (36.7 C) (Oral)   Resp 16   Ht 5' 10"  (1.778 m)   Wt 226 lb (102.5 kg)   SpO2 96%   BMI 32.43 kg/m   BP Readings from Last 3 Encounters:  04/05/21 (!) 144/82  12/28/20 (!) 146/88  03/30/20 136/76    Wt Readings from Last 3 Encounters:  04/05/21 226 lb (102.5 kg)  12/28/20 231 lb (104.8 kg)  03/30/20 228 lb (103.4 kg)    Physical Exam Vitals reviewed.  Constitutional:      Appearance: Normal appearance.  HENT:     Nose: Nose normal.     Mouth/Throat:     Mouth: Mucous membranes are moist.  Eyes:     General: No scleral icterus.    Conjunctiva/sclera: Conjunctivae normal.  Cardiovascular:     Rate and Rhythm: Normal rate and regular rhythm.     Heart sounds: Normal heart sounds, S1 normal and S2 normal. No murmur heard.    Comments: EKG- NSR, 70 bpm Q waves in III and aVF are old No LVH No changes compared to the prior EKG Pulmonary:     Effort: Pulmonary effort is normal.     Breath sounds: No stridor. No wheezing, rhonchi or rales.  Abdominal:     General: Abdomen is protuberant. Bowel sounds are normal. There is no distension.     Palpations:  Abdomen is soft. There is no fluid wave, hepatomegaly, splenomegaly or mass.     Tenderness: There is no abdominal tenderness.  Musculoskeletal:     Cervical back: Neck supple.     Right lower leg: No edema.     Left lower leg: No edema.  Lymphadenopathy:     Cervical: No cervical adenopathy.  Skin:    General: Skin is warm and dry.     Coloration: Skin is not pale.  Neurological:     General: No focal deficit present.     Mental Status: He is alert and oriented to person, place, and time. Mental status is at baseline.  Psychiatric:        Mood and Affect: Mood normal.        Behavior: Behavior normal.    Lab Results  Component Value Date   WBC 3.9 (L) 04/05/2021   HGB 16.3 04/05/2021   HCT 47.3 04/05/2021   PLT 132.0 (L) 04/05/2021   GLUCOSE 139 (H) 04/05/2021   CHOL 177 04/05/2021   TRIG 129.0 04/05/2021   HDL 42.00 04/05/2021   LDLDIRECT 146.5 12/05/2010   LDLCALC 109 (H) 04/05/2021   ALT 59 (H) 12/28/2020   AST 39 (H) 12/28/2020   NA 138 04/05/2021   K 3.9 04/05/2021   CL 102 04/05/2021   CREATININE 0.90 04/05/2021   BUN 26 (H) 04/05/2021   CO2 27 04/05/2021   TSH 2.84 04/05/2021   PSA 0.62 04/05/2021   INR 1.0 11/10/2019   HGBA1C 6.7 (H) 04/05/2021   MICROALBUR 0.9 12/28/2020    CT Chest Wo Contrast  Result Date: 07/28/2019 CLINICAL DATA:  Lung nodule, less than 1 cm. EXAM: CT CHEST WITHOUT CONTRAST TECHNIQUE: Multidetector CT imaging of the chest was performed following the standard protocol without IV contrast. COMPARISON:  CT chest July 10, 2018 FINDINGS: Cardiovascular: Scattered atherosclerosis and signs of coronary artery disease. Extensive coronary artery calcifications including left main and 3 vessels along with right coronary circulation. No pericardial effusion. Central pulmonary arteries are normal caliber. Vessels not well evaluated given lack of contrast. Mediastinum/Nodes: No signs of mediastinal lymphadenopathy. No axillary or hilar  lymphadenopathy.  No thoracic inlet adenopathy. Lungs/Pleura: No signs effusion or consolidation. Scattered calcified nodules. No new pulmonary nodules. 4 mm right lower lobe pulmonary nodule unchanged (image 87, series 3) 5 x 5 mm nodule in the left costodiaphragmatic sulcus, stable when measured by this observer on the previous exam. Triangular perifissural opacity right chest along the minor fissure likely small subpleural lymph node slightly larger than on the prior study. Subtle ground-glass without focal characteristics present in the superior segment of the right lower lobe. Airways are patent. Upper Abdomen: Lobular liver contour with signs of fissural retraction/widening. Visualized spleen is likely mildly enlarged between 12 and 13 cm. Musculoskeletal: No signs of acute bone finding or destructive bone process. IMPRESSION: 1. Small approximately 5 mm pulmonary  nodules are demonstrated in the lung bases as described. No change with direct measurement comparison. 2. Atherosclerosis and coronary artery disease. 3. Lobular hepatic contour raising the question of liver disease. Clinical and laboratory correlation may be helpful spleen is also at the upper limits of normal to slightly enlarged at approximately 12-13 cm. Aortic Atherosclerosis (ICD10-I70.0). Electronically Signed   By: Zetta Bills M.D.   On: 07/28/2019 10:10    Assessment & Plan:   Peterson was seen today for annual exam, hypertension, hyperlipidemia and diabetes.  Diagnoses and all orders for this visit:  Essential hypertension- He has not achieved his blood pressure goal of 130/80.  Will continue the current antihypertensives and he will improve his lifestyle modifications. -     TSH; Future -     Basic metabolic panel; Future -     EKG 12-Lead -     Basic metabolic panel -     TSH  Hyperlipidemia with target LDL less than 100- I have asked him to take a statin for cardiovascular risk reduction. -     TSH; Future -      Pitavastatin Calcium (LIVALO) 4 MG TABS; Take 1 tablet (4 mg total) by mouth daily. -     TSH  BPH associated with nocturia  B12 deficiency- Will continue parenteral B12 replacement therapy. -     CBC with Differential/Platelet; Future -     CBC with Differential/Platelet  Chronic ITP (idiopathic thrombocytopenia) (HCC)- His platelet count is stable.  There is no history of bleeding or bruising. -     CBC with Differential/Platelet; Future -     CBC with Differential/Platelet  Nonalcoholic steatohepatitis (NASH)- He will continue to work on his lifestyle modifications.  Routine general medical examination at a health care facility- Exam completed, labs reviewed, vaccines reviewed - He refused vaccines against pneumonia and shingles, cancer screenings are up-to-date, patient education was given. -     Lipid panel; Future -     PSA; Future -     PSA -     Lipid panel  Coronary atherosclerosis due to calcified coronary lesion- He is asymptomatic with this.  Risk factor modifications addressed.  Type II diabetes mellitus with manifestations ([place])- His blood sugar is adequately well controlled. -     Basic metabolic panel; Future -     Hemoglobin A1c; Future -     HM Diabetes Foot Exam -     Hemoglobin A1c -     Basic metabolic panel  I am having Pierce Crane. [facility]" maintain his fexofenadine, esomeprazole, celecoxib, Vitamin D3, potassium chloride SA, irbesartan, triamterene-hydrochlorothiazide, and Livalo. We will stop administering cyanocobalamin.  Meds ordered this encounter  Medications   Pitavastatin Calcium (LIVALO) 4 MG TABS    Sig: Take 1 tablet (4 mg total) by mouth daily.    Dispense:  90 tablet    Refill:  1      Follow-up: Return in about 6 months (around 10/05/2021).  Scarlette Calico, MD

## 2021-04-05 NOTE — Patient Instructions (Signed)

## 2021-04-07 ENCOUNTER — Other Ambulatory Visit: Payer: Self-pay | Admitting: Internal Medicine

## 2021-04-07 DIAGNOSIS — E118 Type 2 diabetes mellitus with unspecified complications: Secondary | ICD-10-CM

## 2021-04-07 DIAGNOSIS — K7581 Nonalcoholic steatohepatitis (NASH): Secondary | ICD-10-CM

## 2021-04-12 ENCOUNTER — Ambulatory Visit (INDEPENDENT_AMBULATORY_CARE_PROVIDER_SITE_OTHER): Payer: Managed Care, Other (non HMO)

## 2021-04-12 ENCOUNTER — Other Ambulatory Visit: Payer: Self-pay

## 2021-04-12 DIAGNOSIS — E538 Deficiency of other specified B group vitamins: Secondary | ICD-10-CM

## 2021-04-12 MED ORDER — CYANOCOBALAMIN 1000 MCG/ML IJ SOLN
1000.0000 ug | Freq: Once | INTRAMUSCULAR | Status: AC
  Administered 2021-04-12: 1000 ug via INTRAMUSCULAR

## 2021-04-12 NOTE — Progress Notes (Signed)
Pt here for monthly B12 injection per Dr. Ronnald Ramp  B12 1036mg given IM, and pt tolerated injection well.  Next B12 injection scheduled for 05/23/21

## 2021-04-18 ENCOUNTER — Other Ambulatory Visit: Payer: Self-pay | Admitting: Internal Medicine

## 2021-04-18 DIAGNOSIS — E785 Hyperlipidemia, unspecified: Secondary | ICD-10-CM

## 2021-04-18 DIAGNOSIS — I2510 Atherosclerotic heart disease of native coronary artery without angina pectoris: Secondary | ICD-10-CM

## 2021-04-18 DIAGNOSIS — I2584 Coronary atherosclerosis due to calcified coronary lesion: Secondary | ICD-10-CM

## 2021-04-18 MED ORDER — ROSUVASTATIN CALCIUM 10 MG PO TABS: 10.0000 mg | ORAL_TABLET | Freq: Every day | ORAL | 1 refills | Status: DC

## 2021-05-23 ENCOUNTER — Other Ambulatory Visit: Payer: Self-pay

## 2021-05-23 ENCOUNTER — Ambulatory Visit (INDEPENDENT_AMBULATORY_CARE_PROVIDER_SITE_OTHER): Payer: Managed Care, Other (non HMO)

## 2021-05-23 DIAGNOSIS — E538 Deficiency of other specified B group vitamins: Secondary | ICD-10-CM

## 2021-05-23 MED ORDER — CYANOCOBALAMIN 1000 MCG/ML IJ SOLN
1000.0000 ug | INTRAMUSCULAR | Status: AC
  Administered 2021-05-23: 1000 ug via INTRAMUSCULAR

## 2021-05-23 NOTE — Progress Notes (Signed)
Pt here for monthly B12 injection per Dr. Ronnald Ramp.  B12 1066mg given IM in right deltoid and pt tolerated injection well.  Next B12 injection scheduled for 06/27/21.

## 2021-05-24 ENCOUNTER — Other Ambulatory Visit: Payer: Self-pay | Admitting: Internal Medicine

## 2021-05-31 ENCOUNTER — Other Ambulatory Visit: Payer: Self-pay | Admitting: Internal Medicine

## 2021-06-04 ENCOUNTER — Other Ambulatory Visit: Payer: Self-pay | Admitting: Internal Medicine

## 2021-06-04 DIAGNOSIS — E876 Hypokalemia: Secondary | ICD-10-CM

## 2021-06-04 DIAGNOSIS — I10 Essential (primary) hypertension: Secondary | ICD-10-CM

## 2021-06-09 ENCOUNTER — Other Ambulatory Visit: Payer: Self-pay | Admitting: Internal Medicine

## 2021-06-09 DIAGNOSIS — E559 Vitamin D deficiency, unspecified: Secondary | ICD-10-CM

## 2021-06-11 ENCOUNTER — Other Ambulatory Visit: Payer: Self-pay | Admitting: Internal Medicine

## 2021-06-11 DIAGNOSIS — E876 Hypokalemia: Secondary | ICD-10-CM

## 2021-06-11 DIAGNOSIS — I10 Essential (primary) hypertension: Secondary | ICD-10-CM

## 2021-06-27 ENCOUNTER — Ambulatory Visit: Payer: Managed Care, Other (non HMO)

## 2021-06-28 ENCOUNTER — Ambulatory Visit (INDEPENDENT_AMBULATORY_CARE_PROVIDER_SITE_OTHER): Payer: Managed Care, Other (non HMO)

## 2021-06-28 ENCOUNTER — Other Ambulatory Visit: Payer: Self-pay

## 2021-06-28 DIAGNOSIS — E538 Deficiency of other specified B group vitamins: Secondary | ICD-10-CM | POA: Diagnosis not present

## 2021-06-28 MED ORDER — CYANOCOBALAMIN 1000 MCG/ML IJ SOLN
1000.0000 ug | Freq: Once | INTRAMUSCULAR | Status: AC
  Administered 2021-06-28: 1000 ug via INTRAMUSCULAR

## 2021-06-28 NOTE — Progress Notes (Signed)
B12 given w/o any complications.

## 2021-08-02 ENCOUNTER — Other Ambulatory Visit: Payer: Self-pay

## 2021-08-02 ENCOUNTER — Ambulatory Visit (INDEPENDENT_AMBULATORY_CARE_PROVIDER_SITE_OTHER): Payer: Managed Care, Other (non HMO)

## 2021-08-02 DIAGNOSIS — E538 Deficiency of other specified B group vitamins: Secondary | ICD-10-CM

## 2021-08-02 MED ORDER — CYANOCOBALAMIN 1000 MCG/ML IJ SOLN
1000.0000 ug | Freq: Once | INTRAMUSCULAR | Status: AC
  Administered 2021-08-02: 1000 ug via INTRAMUSCULAR

## 2021-08-02 NOTE — Progress Notes (Signed)
Pt given B12 w/o any complications. °

## 2021-08-16 ENCOUNTER — Other Ambulatory Visit: Payer: Self-pay | Admitting: Internal Medicine

## 2021-08-16 DIAGNOSIS — I10 Essential (primary) hypertension: Secondary | ICD-10-CM

## 2021-08-16 DIAGNOSIS — I2510 Atherosclerotic heart disease of native coronary artery without angina pectoris: Secondary | ICD-10-CM

## 2021-08-28 ENCOUNTER — Other Ambulatory Visit: Payer: Self-pay | Admitting: Internal Medicine

## 2021-08-28 DIAGNOSIS — I10 Essential (primary) hypertension: Secondary | ICD-10-CM

## 2021-08-28 DIAGNOSIS — E876 Hypokalemia: Secondary | ICD-10-CM

## 2021-09-05 ENCOUNTER — Other Ambulatory Visit: Payer: Self-pay

## 2021-09-05 ENCOUNTER — Ambulatory Visit (INDEPENDENT_AMBULATORY_CARE_PROVIDER_SITE_OTHER): Payer: Managed Care, Other (non HMO)

## 2021-09-05 DIAGNOSIS — E538 Deficiency of other specified B group vitamins: Secondary | ICD-10-CM | POA: Diagnosis not present

## 2021-09-05 MED ORDER — CYANOCOBALAMIN 1000 MCG/ML IJ SOLN
1000.0000 ug | Freq: Once | INTRAMUSCULAR | Status: AC
  Administered 2021-12-19: 1000 ug via INTRAMUSCULAR

## 2021-09-05 NOTE — Progress Notes (Signed)
Pt was given B12 injection w/o any complications.

## 2021-09-27 ENCOUNTER — Other Ambulatory Visit: Payer: Self-pay | Admitting: Internal Medicine

## 2021-09-27 DIAGNOSIS — K7581 Nonalcoholic steatohepatitis (NASH): Secondary | ICD-10-CM

## 2021-09-27 DIAGNOSIS — E785 Hyperlipidemia, unspecified: Secondary | ICD-10-CM

## 2021-09-27 DIAGNOSIS — E118 Type 2 diabetes mellitus with unspecified complications: Secondary | ICD-10-CM

## 2021-09-27 DIAGNOSIS — I2510 Atherosclerotic heart disease of native coronary artery without angina pectoris: Secondary | ICD-10-CM

## 2021-10-04 ENCOUNTER — Encounter: Payer: Self-pay | Admitting: Internal Medicine

## 2021-10-04 NOTE — Progress Notes (Signed)
Subjective:    Patient ID: Alex [name], male    DOB: 06-02-1956, 65 y.o.   MRN: [id number]  This visit occurred during the SARS-CoV-2 public health emergency.  Safety protocols were in place, including screening questions prior to the visit, additional usage of staff PPE, and extensive cleaning of exam room while observing appropriate contact time as indicated for disinfecting solutions.    HPI The patient is here for an acute visit.  Before thanksgiving has had an annoying tickle cough, postnasal drip fatigue, hoarse.  He has noticed that his cough is worse when he gets hot.  The cough has been hanging on, which is his concern.  He still been doing all of his routine activities.  He has been taking Delsym, Coricidin cold products and Tylenol.  His wife has similar symptoms and she was just prescribed prednisone and is wondering if that would help knock this out.  COVID test have been negative.    Medications and allergies reviewed with patient and updated if appropriate.  Patient Active Problem List   Diagnosis Date Noted   Chronic ITP (idiopathic thrombocytopenia) (HCC) 11/11/2019   Coronary atherosclerosis due to calcified coronary lesion 07/28/2019   Incidental lung nodule, > 77m and < [id number] Nonalcoholic steatohepatitis (NASH) 03/25/2019   Type II diabetes mellitus with manifestations (HCOtoe05/28/2020   Vitamin D deficiency 02/10/2018   Snoring 01/22/2017   Routine general medical examination at a health care facility 01/17/2016   Hyperlipidemia with target LDL less than 100 11/23/2009   GILBERT'S SYNDROME 11/23/2009   B12 deficiency 10/18/2009   ALLERGIC RHINITIS 11/16/2008   BPH associated with nocturia 11/16/2008   Essential hypertension 09/17/2007   Homocysteinemia ([place]11/18/2008   Esophageal reflux 09/15/2007   Barrett's esophagus 09/15/2007    Current Outpatient Medications on File Prior to Visit  Medication Sig Dispense Refill   celecoxib  (CELEBREX) 200 MG capsule Take 200 mg by mouth daily as needed.     Cholecalciferol (VITAMIN D3) 50 MCG (2000 UT) TABS TAKE 1 TABLET BY MOUTH DAILY 90 tablet 1   esomeprazole (NEXIUM) 40 MG capsule TAKE 1 CAPSULE BY MOUTH EVERY MORNING BEFORE BREAKFAST 30 capsule 5   fexofenadine (ALLEGRA) 180 MG tablet Take 180 mg by mouth daily.     irbesartan (AVAPRO) 300 MG tablet TAKE 1 TABLET(300 MG) BY MOUTH DAILY 90 tablet 0   pioglitazone (ACTOS) 15 MG tablet TAKE 1 TABLET(15 MG) BY MOUTH DAILY 90 tablet 1   potassium chloride SA (KLOR-CON) 20 MEQ tablet TAKE 1 TABLET(20 MEQ) BY MOUTH DAILY 90 tablet 1   rosuvastatin (CRESTOR) 10 MG tablet Take 1 tablet (10 mg total) by mouth daily. 90 tablet 1   triamterene-hydrochlorothiazide (DYAZIDE) 37.5-25 MG capsule TAKE 1 CAPSULE BY MOUTH DAILY 90 capsule 0   Current Facility-Administered Medications on File Prior to Visit  Medication Dose Route Frequency Provider Last Rate Last Admin   cyanocobalamin ((VITAMIN B-12)) injection 1,000 mcg  1,000 mcg Intramuscular Q30 days JoJanith LimaMD   1,000 mcg at 05/23/21 1441   cyanocobalamin ((VITAMIN B-12)) injection 1,000 mcg  1,000 mcg Intramuscular Once JoJanith LimaMD        Past Medical History:  Diagnosis Date   Achilles tendinitis    PMH of   Allergy    Arthritis    B12 deficiency    Barrett's esophagus    Diabetes mellitus without complication ([facility]   Diverticulosis of colon (without mention of  hemorrhage) 2010   Fatty liver 10-16-2011   [place]   GERD (gastroesophageal reflux disease)    Rosanna Randy syndrome    Hiatal hernia    Homocystinemia (HCC)    Hyperlipidemia    Hypertension    Nonspecific elevation of levels of transaminase or lactic acid dehydrogenase (LDH)    PMH of   OSA on CPAP    Sleep apnea     Past Surgical History:  Procedure Laterality Date   COLONOSCOPY  2010   negative   KNEE ARTHROSCOPY     R 2015 & L 2016; [facility]   REPLACEMENT TOTAL KNEE Right 01/15/2018    REPLACEMENT TOTAL KNEE Left 02/19/2018   UPPER GASTROINTESTINAL ENDOSCOPY     Dr Sharlett Iles; multiple  [facility]nce 1992   VASECTOMY     WISDOM TOOTH EXTRACTION      Social History   Socioeconomic History   Marital status: Married    Spouse name: Not on file   Number of children: 2   Years of education: 12   Highest education level: Not on file  Occupational History   Occupation: dry cleaners  Tobacco Use   Smoking status: Never   Smokeless tobacco: Never  Vaping Use   Vaping Use: Never used  Substance and Sexual Activity   Alcohol use: No   Drug use: No   Sexual activity: Yes  Other Topics Concern   Not on file  Social History Narrative   Caffeine: Drinks 2 sodas a day   Social Determinants of Radio broadcast assistant Strain: Not on file  Food Insecurity: Not on file  Transportation Needs: Not on file  Physical Activity: Not on file  Stress: Not on file  Social Connections: Not on file    Family History  Problem Relation Age of Onset   Esophageal cancer Father    Stomach cancer Father    Hypertension Father    Heart disease Father        CBAG @ 67   Hypertension Mother    Heart attack Mother 76   Cancer Mother    Hypertension Sister    Hypertension Sister    Hypertension Sister    Stroke Neg Hx    Diabetes Neg Hx    Early death Neg Hx    Hyperlipidemia Neg Hx    Kidney disease Neg Hx    Alcohol abuse Neg Hx    Rectal cancer Neg Hx     Review of Systems  Constitutional:  Positive for fatigue. Negative for chills and fever.  HENT:  Positive for postnasal drip, rhinorrhea (clear) and voice change. Negative for congestion, ear pain, sinus pressure, sinus pain and sore throat.   Respiratory:  Positive for cough (dry). Negative for chest tightness, shortness of breath and wheezing.   Neurological:  Negative for dizziness, light-headedness and headaches.      Objective:   Vitals:   10/05/21 1335  BP: 138/82  Pulse: 88  Temp: 98.3 F (36.8 C)  SpO2:  98%   BP Readings from Last 3 Encounters:  10/05/21 138/82  04/05/21 (!) 144/82  12/28/20 (!) 146/88   Wt Readings from Last 3 Encounters:  10/05/21 232 lb (105.2 kg)  04/05/21 226 lb (102.5 kg)  12/28/20 231 lb (104.8 kg)   Body mass index is 33.29 kg/m.   Physical Exam    GENERAL APPEARANCE: Appears stated age, well appearing, NAD EYES: conjunctiva clear, no icterus HENT: bilateral tympanic membranes and ear canals normal, oropharynx with  no erythema or exudates, trachea midline, no cervical or supraclavicular lymphadenopathy LUNGS: Unlabored breathing, good air entry bilaterally, clear to auscultation without wheeze or crackles CARDIOVASCULAR: Normal S1,S2 , no edema SKIN: Warm, dry      Assessment & Plan:    Cough: Acute Residual from URI Possible low-grade reactive airway disease Will prescribe Medrol Dosepak Discussed that this may elevate his sugars transiently and he needs to be careful with what he is eating and continue to take his medication on a regular basis-he is a well-controlled diabetic.  Last A1c less than 7% Can continue over-the-counter cold medications, Coricidin HBP, Delsym as needed Continue rest, fluids Call if no improvement

## 2021-10-05 ENCOUNTER — Other Ambulatory Visit: Payer: Self-pay

## 2021-10-05 ENCOUNTER — Ambulatory Visit: Payer: Managed Care, Other (non HMO)

## 2021-10-05 ENCOUNTER — Encounter: Payer: Self-pay | Admitting: Internal Medicine

## 2021-10-05 ENCOUNTER — Ambulatory Visit: Payer: Managed Care, Other (non HMO) | Admitting: Internal Medicine

## 2021-10-05 VITALS — BP 138/82 | HR 88 | Temp 98.3°F | Ht 70.0 in | Wt 232.0 lb

## 2021-10-05 DIAGNOSIS — R051 Acute cough: Secondary | ICD-10-CM | POA: Diagnosis not present

## 2021-10-05 MED ORDER — METHYLPREDNISOLONE 4 MG PO TBPK: ORAL_TABLET | ORAL | 0 refills | Status: DC

## 2021-10-05 NOTE — Patient Instructions (Addendum)
    Continue your allergy medication.     Medications changes include :   steroid taper   Your prescription(s) have been submitted to your pharmacy. Please take as directed and contact our office if you believe you are having problem(s) with the medication(s).

## 2021-10-08 ENCOUNTER — Other Ambulatory Visit: Payer: Self-pay

## 2021-10-08 ENCOUNTER — Other Ambulatory Visit: Payer: Self-pay | Admitting: Internal Medicine

## 2021-10-08 ENCOUNTER — Telehealth: Payer: Self-pay | Admitting: Internal Medicine

## 2021-10-08 ENCOUNTER — Ambulatory Visit (INDEPENDENT_AMBULATORY_CARE_PROVIDER_SITE_OTHER): Payer: Managed Care, Other (non HMO)

## 2021-10-08 DIAGNOSIS — E118 Type 2 diabetes mellitus with unspecified complications: Secondary | ICD-10-CM

## 2021-10-08 DIAGNOSIS — E538 Deficiency of other specified B group vitamins: Secondary | ICD-10-CM | POA: Diagnosis not present

## 2021-10-08 DIAGNOSIS — K7581 Nonalcoholic steatohepatitis (NASH): Secondary | ICD-10-CM

## 2021-10-08 MED ORDER — PIOGLITAZONE HCL 15 MG PO TABS: 15.0000 mg | ORAL_TABLET | Freq: Every day | ORAL | 0 refills | Status: DC

## 2021-10-08 MED ORDER — CYANOCOBALAMIN 1000 MCG/ML IJ SOLN
1000.0000 ug | Freq: Once | INTRAMUSCULAR | Status: AC
  Administered 2021-10-08: 1000 ug via INTRAMUSCULAR

## 2021-10-08 NOTE — Telephone Encounter (Signed)
1.Medication Requested: Pioglitazone  2. Pharmacy (Name, Street, [place]): [facility]  3. On Med List: yes  4. Last Visit with PCP: 6.9.22  5. Next visit date with PCP: 1.18.2023   Agent: Please be advised that RX refills may take up to 3 business days. We ask that you follow-up with your pharmacy.  Patient requested a short fill until his appointment 1.18.23

## 2021-10-08 NOTE — Progress Notes (Signed)
Pt was given Y56 w/o any complications.

## 2021-11-03 ENCOUNTER — Other Ambulatory Visit: Payer: Self-pay | Admitting: Internal Medicine

## 2021-11-03 DIAGNOSIS — I2584 Coronary atherosclerosis due to calcified coronary lesion: Secondary | ICD-10-CM

## 2021-11-03 DIAGNOSIS — I2510 Atherosclerotic heart disease of native coronary artery without angina pectoris: Secondary | ICD-10-CM

## 2021-11-03 DIAGNOSIS — I10 Essential (primary) hypertension: Secondary | ICD-10-CM

## 2021-11-14 ENCOUNTER — Encounter: Payer: Self-pay | Admitting: Internal Medicine

## 2021-11-14 ENCOUNTER — Ambulatory Visit: Payer: Managed Care, Other (non HMO)

## 2021-11-14 ENCOUNTER — Ambulatory Visit: Payer: Managed Care, Other (non HMO) | Admitting: Internal Medicine

## 2021-11-14 ENCOUNTER — Other Ambulatory Visit: Payer: Self-pay

## 2021-11-14 VITALS — BP 144/84 | HR 90 | Temp 98.1°F | Resp 16 | Ht 70.0 in | Wt 230.0 lb

## 2021-11-14 DIAGNOSIS — I10 Essential (primary) hypertension: Secondary | ICD-10-CM | POA: Diagnosis not present

## 2021-11-14 DIAGNOSIS — E118 Type 2 diabetes mellitus with unspecified complications: Secondary | ICD-10-CM

## 2021-11-14 DIAGNOSIS — E119 Type 2 diabetes mellitus without complications: Secondary | ICD-10-CM

## 2021-11-14 DIAGNOSIS — K7581 Nonalcoholic steatohepatitis (NASH): Secondary | ICD-10-CM

## 2021-11-14 DIAGNOSIS — D693 Immune thrombocytopenic purpura: Secondary | ICD-10-CM | POA: Diagnosis not present

## 2021-11-14 DIAGNOSIS — E538 Deficiency of other specified B group vitamins: Secondary | ICD-10-CM | POA: Diagnosis not present

## 2021-11-14 DIAGNOSIS — Z794 Long term (current) use of insulin: Secondary | ICD-10-CM

## 2021-11-14 LAB — BASIC METABOLIC PANEL
BUN: 21 mg/dL (ref 6–23)
CO2: 27 mEq/L (ref 19–32)
Calcium: 9.1 mg/dL (ref 8.4–10.5)
Chloride: 101 mEq/L (ref 96–112)
Creatinine, Ser: 1 mg/dL (ref 0.40–1.50)
GFR: 79.55 mL/min (ref >60.00)
Glucose, Bld: 267 mg/dL — ABNORMAL HIGH (ref 70–99)
Potassium: 3.9 mEq/L (ref 3.5–5.1)
Sodium: 135 mEq/L (ref 135–145)

## 2021-11-14 LAB — CBC WITH DIFFERENTIAL/PLATELET
Basophils Absolute: 0.1 10*3/uL (ref 0.0–0.1)
Basophils Relative: 1.4 % (ref 0.0–3.0)
Eosinophils Absolute: 0.1 10*3/uL (ref 0.0–0.7)
Eosinophils Relative: 2.3 % (ref 0.0–5.0)
HCT: 45 % (ref 39.0–52.0)
Hemoglobin: 15.3 g/dL (ref 13.0–17.0)
Lymphocytes Relative: 33.3 % (ref 12.0–46.0)
Lymphs Abs: 1.3 10*3/uL (ref 0.7–4.0)
MCHC: 34 g/dL (ref 30.0–36.0)
MCV: 94.1 fl (ref 78.0–100.0)
Monocytes Absolute: 0.3 10*3/uL (ref 0.1–1.0)
Monocytes Relative: 8.9 % (ref 3.0–12.0)
Neutro Abs: 2.1 10*3/uL (ref 1.4–7.7)
Neutrophils Relative %: 54.1 % (ref 43.0–77.0)
Platelets: 118 10*3/uL — ABNORMAL LOW (ref 150.0–400.0)
RBC: 4.78 Mil/uL (ref 4.22–5.81)
RDW: 12.7 % (ref 11.5–15.5)
WBC: 3.8 10*3/uL — ABNORMAL LOW (ref 4.0–10.5)

## 2021-11-14 LAB — HEPATIC FUNCTION PANEL
ALT: 53 U/L (ref 0–53)
AST: 38 U/L — ABNORMAL HIGH (ref 0–37)
Albumin: 4.3 g/dL (ref 3.5–5.2)
Alkaline Phosphatase: 68 U/L (ref 39–117)
Bilirubin, Direct: 0.1 mg/dL (ref 0.0–0.3)
Total Bilirubin: 0.8 mg/dL (ref 0.2–1.2)
Total Protein: 7.2 g/dL (ref 6.0–8.3)

## 2021-11-14 LAB — FOLATE: Folate: 23.5 ng/mL (ref >5.9)

## 2021-11-14 LAB — PROTIME-INR
INR: 1 ratio (ref 0.8–1.0)
Prothrombin Time: 11.4 s (ref 9.6–13.1)

## 2021-11-14 LAB — HEMOGLOBIN A1C: Hgb A1c MFr Bld: 10.4 % — ABNORMAL HIGH (ref 4.6–6.5)

## 2021-11-14 MED ORDER — SOLIQUA 100-33 UNT-MCG/ML ~~LOC~~ SOPN: 20.0000 [IU] | PEN_INJECTOR | Freq: Every day | SUBCUTANEOUS | 1 refills | Status: DC

## 2021-11-14 MED ORDER — INSULIN PEN NEEDLE 32G X 6 MM MISC: 1.0000 | Freq: Every day | 1 refills | Status: DC

## 2021-11-14 MED ORDER — CYANOCOBALAMIN 1000 MCG/ML IJ SOLN
1000.0000 ug | Freq: Once | INTRAMUSCULAR | Status: AC
  Administered 2021-11-14: 1000 ug via INTRAMUSCULAR

## 2021-11-14 MED ORDER — FREESTYLE LIBRE 2 SENSOR MISC: 1.0000 | Freq: Every day | 5 refills | Status: DC

## 2021-11-14 MED ORDER — METFORMIN HCL ER 750 MG PO TB24: 1500.0000 mg | ORAL_TABLET | Freq: Every day | ORAL | 0 refills | Status: DC

## 2021-11-14 MED ORDER — FREESTYLE LIBRE 2 READER DEVI: 1.0000 | Freq: Every day | 5 refills | Status: DC

## 2021-11-14 NOTE — Progress Notes (Signed)
Subjective:  Patient ID: Alex [name], male    DOB: Mar 20, 1956  Age: 66 y.o. MRN: [id number]  CC: Hypertension and Diabetes  This visit occurred during the SARS-CoV-2 public health emergency.  Safety protocols were in place, including screening questions prior to the visit, additional usage of staff PPE, and extensive cleaning of exam room while observing appropriate contact time as indicated for disinfecting solutions.    HPI Alex [name] presents for f/up -  He recently went to [place] with his family and walked 10 miles a day.  He did not experience chest pain, shortness of breath, diaphoresis, dizziness, or lightheadedness.   Outpatient Medications Prior to Visit  Medication Sig Dispense Refill   celecoxib (CELEBREX) 200 MG capsule Take 200 mg by mouth daily as needed.     Cholecalciferol (VITAMIN D3) 50 MCG (2000 UT) TABS TAKE 1 TABLET BY MOUTH DAILY 90 tablet 1   esomeprazole (NEXIUM) 40 MG capsule TAKE 1 CAPSULE BY MOUTH EVERY MORNING BEFORE BREAKFAST 30 capsule 5   fexofenadine (ALLEGRA) 180 MG tablet Take 180 mg by mouth daily.     irbesartan (AVAPRO) 300 MG tablet TAKE 1 TABLET(300 MG) BY MOUTH DAILY 90 tablet 0   methylPREDNISolone (MEDROL DOSEPAK) 4 MG TBPK tablet 24 mg PO on day 1, then decr. by 4 mg/day x5 days 21 tablet 0   pioglitazone (ACTOS) 15 MG tablet Take 1 tablet (15 mg total) by mouth daily. 90 tablet 0   potassium chloride SA (KLOR-CON) 20 MEQ tablet TAKE 1 TABLET(20 MEQ) BY MOUTH DAILY 90 tablet 1   rosuvastatin (CRESTOR) 10 MG tablet Take 1 tablet (10 mg total) by mouth daily. 90 tablet 1   triamterene-hydrochlorothiazide (DYAZIDE) 37.5-25 MG capsule TAKE 1 CAPSULE BY MOUTH DAILY 90 capsule 0   Facility-Administered Medications Prior to Visit  Medication Dose Route Frequency Provider Last Rate Last Admin   cyanocobalamin ((VITAMIN B-12)) injection 1,000 mcg  1,000 mcg Intramuscular Once Janith Lima, MD        ROS Review of Systems   Constitutional:  Negative for diaphoresis, fatigue and unexpected weight change.  HENT: Negative.    Eyes: Negative.   Respiratory:  Negative for cough, chest tightness, shortness of breath and wheezing.   Cardiovascular:  Negative for chest pain, palpitations and leg swelling.  Gastrointestinal:  Negative for abdominal pain, constipation, diarrhea, nausea and vomiting.  Endocrine: Negative.  Negative for polydipsia, polyphagia and polyuria.  Genitourinary: Negative.  Negative for difficulty urinating.  Musculoskeletal: Negative.  Negative for arthralgias.  Skin: Negative.  Negative for color change.  Neurological:  Negative for dizziness, weakness, light-headedness and headaches.  Hematological:  Negative for adenopathy. Does not bruise/bleed easily.  Psychiatric/Behavioral: Negative.     Objective:  BP (!) 144/84 (BP Location: Right Arm, Patient Position: Sitting, Cuff Size: Large)    Pulse 90    Temp 98.1 F (36.7 C) (Oral)    Resp 16    Ht 5\' 10"  (1.778 m)    Wt 230 lb (104.3 kg)    SpO2 96%    BMI 33.00 kg/m   BP Readings from Last 3 Encounters:  11/14/21 (!) 144/84  10/05/21 138/82  04/05/21 (!) 144/82    Wt Readings from Last 3 Encounters:  11/14/21 230 lb (104.3 kg)  10/05/21 232 lb (105.2 kg)  04/05/21 226 lb (102.5 kg)    Physical Exam Vitals reviewed.  Constitutional:      Appearance: He is not ill-appearing.  HENT:  Mouth/Throat:     Mouth: Mucous membranes are moist.  Eyes:     General: No scleral icterus.    Conjunctiva/sclera: Conjunctivae normal.  Cardiovascular:     Rate and Rhythm: Normal rate and regular rhythm.     Heart sounds: No murmur heard. Pulmonary:     Effort: Pulmonary effort is normal.     Breath sounds: No stridor. No wheezing or rhonchi.  Abdominal:     General: Abdomen is protuberant.     Palpations: There is no mass.     Tenderness: There is no abdominal tenderness. There is no guarding.  Musculoskeletal:        General:  Normal range of motion.     Cervical back: Neck supple.     Right lower leg: No edema.     Left lower leg: No edema.  Lymphadenopathy:     Cervical: No cervical adenopathy.  Skin:    General: Skin is warm and dry.  Neurological:     General: No focal deficit present.     Mental Status: He is alert. Mental status is at baseline.    Lab Results  Component Value Date   WBC 3.8 (L) 11/14/2021   HGB 15.3 11/14/2021   HCT 45.0 11/14/2021   PLT 118.0 (L) 11/14/2021   GLUCOSE 267 (H) 11/14/2021   CHOL 177 04/05/2021   TRIG 129.0 04/05/2021   HDL 42.00 04/05/2021   LDLDIRECT 146.5 12/05/2010   LDLCALC 109 (H) 04/05/2021   ALT 53 11/14/2021   AST 38 (H) 11/14/2021   NA 135 11/14/2021   K 3.9 11/14/2021   CL 101 11/14/2021   CREATININE 1.00 11/14/2021   BUN 21 11/14/2021   CO2 27 11/14/2021   TSH 2.84 04/05/2021   PSA 0.62 04/05/2021   INR 1.0 11/14/2021   HGBA1C 10.4 (H) 11/14/2021   MICROALBUR 0.9 12/28/2020    CT Chest Wo Contrast  Result Date: 07/28/2019 CLINICAL DATA:  Lung nodule, less than 1 cm. EXAM: CT CHEST WITHOUT CONTRAST TECHNIQUE: Multidetector CT imaging of the chest was performed following the standard protocol without IV contrast. COMPARISON:  CT chest July 10, 2018 FINDINGS: Cardiovascular: Scattered atherosclerosis and signs of coronary artery disease. Extensive coronary artery calcifications including left main and 3 vessels along with right coronary circulation. No pericardial effusion. Central pulmonary arteries are normal caliber. Vessels not well evaluated given lack of contrast. Mediastinum/Nodes: No signs of mediastinal lymphadenopathy. No axillary or hilar lymphadenopathy.  No thoracic inlet adenopathy. Lungs/Pleura: No signs effusion or consolidation. Scattered calcified nodules. No new pulmonary nodules. 4 mm right lower lobe pulmonary nodule unchanged (image 87, series 3) 5 x 5 mm nodule in the left costodiaphragmatic sulcus, stable when measured by  this observer on the previous exam. Triangular perifissural opacity right chest along the minor fissure likely small subpleural lymph node slightly larger than on the prior study. Subtle ground-glass without focal characteristics present in the superior segment of the right lower lobe. Airways are patent. Upper Abdomen: Lobular liver contour with signs of fissural retraction/widening. Visualized spleen is likely mildly enlarged between 12 and 13 cm. Musculoskeletal: No signs of acute bone finding or destructive bone process. IMPRESSION: 1. Small approximately 5 mm pulmonary nodules are demonstrated in the lung bases as described. No change with direct measurement comparison. 2. Atherosclerosis and coronary artery disease. 3. Lobular hepatic contour raising the question of liver disease. Clinical and laboratory correlation may be helpful spleen is also at the upper limits of normal to slightly  enlarged at approximately 12-13 cm. Aortic Atherosclerosis (ICD10-I70.0). Electronically Signed   By: Zetta Bills M.D.   On: 07/28/2019 10:10    Assessment & Plan:   Zephan was seen today for hypertension and diabetes.  Diagnoses and all orders for this visit:  B12 deficiency -     cyanocobalamin ((VITAMIN B-12)) injection 1,000 mcg -     Folate; Future -     CBC with Differential/Platelet; Future -     CBC with Differential/Platelet -     Folate  Essential hypertension- His blood pressure is adequately well controlled. -     Basic metabolic panel; Future -     Basic metabolic panel  Nonalcoholic steatohepatitis (NASH)- Improvement noted.  Will continue Actos. -     Hepatic function panel; Future -     Protime-INR; Future -     Protime-INR -     Hepatic function panel  Type II diabetes mellitus with manifestations ([place])- See below. -     Basic metabolic panel; Future -     Hemoglobin A1c; Future -     Hemoglobin A1c -     Basic metabolic panel -     metFORMIN (GLUCOPHAGE XR) 750 MG 24 hr  tablet; Take 2 tablets (1,500 mg total) by mouth daily with breakfast.  Chronic ITP (idiopathic thrombocytopenia) (HCC)- His platelet count is stable.  There is no history of bleeding or bruising. -     Folate; Future -     CBC with Differential/Platelet; Future -     CBC with Differential/Platelet -     Folate  Insulin-requiring or dependent type II diabetes mellitus ([place])- His A1c is up to 10.4%.  Will start basal insulin, a GLP P1 agonist, and metformin. -     Insulin Glargine-Lixisenatide (SOLIQUA) 100-33 UNT-MCG/ML SOPN; Inject 20 Units into the skin daily. -     metFORMIN (GLUCOPHAGE XR) 750 MG 24 hr tablet; Take 2 tablets (1,500 mg total) by mouth daily with breakfast. -     Insulin Pen Needle 32G X 6 MM MISC; 1 Act by Does not apply route daily. -     Continuous Blood Gluc Sensor (FREESTYLE LIBRE 2 SENSOR) MISC; 1 Act by Does not apply route daily. -     Continuous Blood Gluc Receiver (FREESTYLE LIBRE 2 READER) DEVI; 1 Act by Does not apply route daily.   I am having Pierce Crane. Tamera Punt "[name]" start on Soliqua, metFORMIN, Insulin Pen Needle, FreeStyle Libre 2 Sensor, and [employer] 2 Reader. I am also having him maintain his fexofenadine, esomeprazole, celecoxib, rosuvastatin, Vitamin D3, potassium chloride SA, triamterene-hydrochlorothiazide, methylPREDNISolone, pioglitazone, and irbesartan. We administered cyanocobalamin. We will continue to administer cyanocobalamin.  Meds ordered this encounter  Medications   cyanocobalamin ((VITAMIN B-12)) injection 1,000 mcg   Insulin Glargine-Lixisenatide (SOLIQUA) 100-33 UNT-MCG/ML SOPN    Sig: Inject 20 Units into the skin daily.    Dispense:  54 mL    Refill:  1   metFORMIN (GLUCOPHAGE XR) 750 MG 24 hr tablet    Sig: Take 2 tablets (1,500 mg total) by mouth daily with breakfast.    Dispense:  180 tablet    Refill:  0   Insulin Pen Needle 32G X 6 MM MISC    Sig: 1 Act by Does not apply route daily.    Dispense:  100 each     Refill:  1   Continuous Blood Gluc Sensor (FREESTYLE LIBRE 2 SENSOR) MISC    Sig: 1 Act  by Does not apply route daily.    Dispense:  2 each    Refill:  5   Continuous Blood Gluc Receiver (FREESTYLE LIBRE 2 READER) DEVI    Sig: 1 Act by Does not apply route daily.    Dispense:  2 each    Refill:  5     Follow-up: Return in about 3 months (around 02/12/2022).  Scarlette Calico, MD

## 2021-11-14 NOTE — Patient Instructions (Signed)

## 2021-11-15 ENCOUNTER — Encounter: Payer: Self-pay | Admitting: Internal Medicine

## 2021-11-16 NOTE — Telephone Encounter (Signed)
Patient calling to inquire if his message on mychart was received, informed patient his message has been received  Patient requesting a response on mychart

## 2021-11-26 ENCOUNTER — Other Ambulatory Visit: Payer: Self-pay | Admitting: Internal Medicine

## 2021-11-26 DIAGNOSIS — I10 Essential (primary) hypertension: Secondary | ICD-10-CM

## 2021-11-26 DIAGNOSIS — T502X5A Adverse effect of carbonic-anhydrase inhibitors, benzothiadiazides and other diuretics, initial encounter: Secondary | ICD-10-CM

## 2021-11-26 DIAGNOSIS — E876 Hypokalemia: Secondary | ICD-10-CM

## 2021-12-04 ENCOUNTER — Other Ambulatory Visit: Payer: Self-pay | Admitting: Internal Medicine

## 2021-12-04 DIAGNOSIS — E785 Hyperlipidemia, unspecified: Secondary | ICD-10-CM

## 2021-12-04 DIAGNOSIS — K7581 Nonalcoholic steatohepatitis (NASH): Secondary | ICD-10-CM

## 2021-12-04 DIAGNOSIS — I2510 Atherosclerotic heart disease of native coronary artery without angina pectoris: Secondary | ICD-10-CM

## 2021-12-04 DIAGNOSIS — E118 Type 2 diabetes mellitus with unspecified complications: Secondary | ICD-10-CM

## 2021-12-07 ENCOUNTER — Other Ambulatory Visit: Payer: Self-pay | Admitting: Internal Medicine

## 2021-12-07 DIAGNOSIS — E559 Vitamin D deficiency, unspecified: Secondary | ICD-10-CM

## 2021-12-19 ENCOUNTER — Other Ambulatory Visit: Payer: Self-pay

## 2021-12-19 ENCOUNTER — Ambulatory Visit (INDEPENDENT_AMBULATORY_CARE_PROVIDER_SITE_OTHER): Payer: Managed Care, Other (non HMO)

## 2021-12-19 DIAGNOSIS — E538 Deficiency of other specified B group vitamins: Secondary | ICD-10-CM | POA: Diagnosis not present

## 2021-12-19 NOTE — Progress Notes (Signed)
Pt here for monthly B12 injection per Dr. Ronnald Ramp  B12 1052mcg given IM, and pt tolerated injection well.  Next B12 injection scheduled for 01/22/22 at [facility].

## 2022-01-03 ENCOUNTER — Other Ambulatory Visit: Payer: Self-pay | Admitting: Internal Medicine

## 2022-01-03 DIAGNOSIS — I2510 Atherosclerotic heart disease of native coronary artery without angina pectoris: Secondary | ICD-10-CM

## 2022-01-03 DIAGNOSIS — I10 Essential (primary) hypertension: Secondary | ICD-10-CM

## 2022-01-07 ENCOUNTER — Other Ambulatory Visit: Payer: Self-pay

## 2022-01-07 ENCOUNTER — Encounter: Payer: Self-pay | Admitting: Dietician

## 2022-01-07 ENCOUNTER — Encounter: Payer: Managed Care, Other (non HMO) | Attending: Internal Medicine | Admitting: Dietician

## 2022-01-07 DIAGNOSIS — E118 Type 2 diabetes mellitus with unspecified complications: Secondary | ICD-10-CM | POA: Insufficient documentation

## 2022-01-07 NOTE — Progress Notes (Signed)
Diabetes Self-Management Education  Visit Type: First/Initial  Appt. Start Time: 1340 Appt. End Time: 1620  01/07/2022  [name], identified by name and date of birth, is a 66 y.o. male with a diagnosis of Diabetes: Type 2.   ASSESSMENT Patient is here today with his wife. He needs training on the [employer]. Newly diagnosed with Type 2 Diabetes. Fasting glucose has gone from 197 11/17/2021 to 119 12/25/2021  History includes Type 2 Diabetes, GERD, OSA (no c-pap), HLD, HTN Medications include:  Metformin, 20 units Soliqua q am, pioglitazone, potassium A1C 10.4% 11/14/2021  Patient lives with his wife.  She does most of the shopping and cooking. He starts work at [employer] each am.  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    Height 5' 10"  (1.778 m), weight 228 lb (103.4 kg). Body mass index is 32.71 kg/m.   Diabetes Self-Management Education - 01/07/22 1645       Visit Information   Visit Type First/Initial      Initial Visit   Diabetes Type Type 2    Are you currently following a meal plan? No    Are you taking your medications as prescribed? Yes    Date Diagnosed 10/2021      Health Coping   How would you rate your overall health? Excellent      Psychosocial Assessment   Patient Belief/Attitude about Diabetes Motivated to manage diabetes    Self-care barriers None    Self-management support Doctor's office;Family    Other persons present Patient;Spouse/SO    Patient Concerns Nutrition/Meal planning    Special Needs None    Preferred Learning Style No preference indicated    Learning Readiness Ready    How often do you need to have someone help you when you read instructions, pamphlets, or other written materials from your doctor or pharmacy? 1 - Never    What is the last grade level you completed in school? 12      Pre-Education Assessment   Patient understands the diabetes disease and treatment process. Needs Instruction     Patient understands incorporating nutritional management into lifestyle. Needs Instruction    Patient undertands incorporating physical activity into lifestyle. Needs Instruction    Patient understands using medications safely. Needs Instruction    Patient understands monitoring blood glucose, interpreting and using results Needs Instruction    Patient understands prevention, detection, and treatment of acute complications. Needs Instruction    Patient understands prevention, detection, and treatment of chronic complications. Needs Instruction    Patient understands how to develop strategies to address psychosocial issues. Needs Instruction    Patient understands how to develop strategies to promote health/change behavior. Needs Instruction      Complications   Last HgB A1C per patient/outside source 10.4 %   11/14/2021 increased from 6.7% 03/2021   How often do you check your blood sugar? 1-2 times/day    Fasting Blood glucose range (mg/dL) 70-129    Number of hypoglycemic episodes per month 0    Number of hyperglycemic episodes per week 0    Have you had a dilated eye exam in the past 12 months? Yes    Have you had a dental exam in the past 12 months? Yes    Are you checking your feet? Yes    How many days per week are you checking your feet? 7      Dietary Intake   Breakfast multigrain cheerios  4 am   Snack (morning) biscuit, grits, scrambled egg AND apple (10:30)   7:30   Lunch Grilled chicken sandwich ([place] or [employer])   12:30   Snack (afternoon) apple   3:30   Dinner pizza OR salmon or grilled chicken, asparagus, sweet potato, side salad with [facility] (evening) grapes or berries, yogurt (tripple zero)    Beverage(s) water, unsweet tea, coffee with milk (2%)      Exercise   Exercise Type Light (walking / raking leaves)   stands and walks all day at work     Patient Education   Previous Diabetes Education No    Disease state  Definition of diabetes,  type 1 and 2, and the diagnosis of diabetes    Nutrition management  Role of diet in the treatment of diabetes and the relationship between the three main macronutrients and blood glucose level;Food label reading, portion sizes and measuring food.;Carbohydrate counting;Information on hints to eating out and maintain blood glucose control.;Meal options for control of blood glucose level and chronic complications.    Physical activity and exercise  Role of exercise on diabetes management, blood pressure control and cardiac health.    Medications Reviewed patients medication for diabetes, action, purpose, timing of dose and side effects.    Monitoring Yearly dilated eye exam;Other (comment);Daily foot exams;Identified appropriate SMBG and/or A1C goals.;Taught/discussed recording of test results and interpretation of SMBG.   taught Libre2 CGM   Acute complications Taught treatment of hypoglycemia - the 15 rule.;Discussed and identified patients' treatment of hyperglycemia.    Chronic complications Relationship between chronic complications and blood glucose control    Psychosocial adjustment Role of stress on diabetes      Individualized Goals (developed by patient)   Nutrition Follow meal plan discussed;General guidelines for healthy choices and portions discussed    Physical Activity 30 minutes per day;Exercise 5-7 days per week    Medications take my medication as prescribed    Monitoring  test my blood glucose as discussed    Reducing Risk examine blood glucose patterns;increase portions of healthy fats;treat hypoglycemia with 15 grams of carbs if blood glucose less than 60m/dL;do foot checks daily      Post-Education Assessment   Patient understands the diabetes disease and treatment process. Demonstrates understanding / competency    Patient understands incorporating nutritional management into lifestyle. Needs Review    Patient undertands incorporating physical activity into lifestyle.  Demonstrates understanding / competency    Patient understands using medications safely. Demonstrates understanding / competency    Patient understands monitoring blood glucose, interpreting and using results Demonstrates understanding / competency    Patient understands prevention, detection, and treatment of acute complications. Demonstrates understanding / competency    Patient understands prevention, detection, and treatment of chronic complications. Demonstrates understanding / competency    Patient understands how to develop strategies to address psychosocial issues. Demonstrates understanding / competency    Patient understands how to develop strategies to promote health/change behavior. Needs Review      Outcomes   Expected Outcomes Demonstrated interest in learning. Expect positive outcomes    Future DMSE 3-4 months    Program Status Not Completed             Individualized Plan for Diabetes Self-Management Training:   Learning Objective:  Patient will have a greater understanding of diabetes self-management. Patient education plan is to attend individual and/or group sessions per assessed needs and concerns.   Plan:  Patient Instructions  Tips to share your information with your MD practice:  You will need a practice ID number (does your MD office use the Libreview portal?) Add the [employer] app to your phone and sign in (or sign up). Receive the email and hit the verify email button. In the [employer], click on the 3 lines, connect to the apps, then enter the practice ID.  Calorie King app     Aim for 3 Carb Choices per meal (45 grams) +/- 1 either way  Aim for 0-1 Carbs per snack if hungry  Include protein in moderation with your meals and snacks Consider reading food labels for Total Carbohydrate of foods Consider  increasing your activity level by walking for 30 minutes daily as tolerated Continue checking BG at alternate times per day  Continue  taking medication as directed by MD      Expected Outcomes:  Demonstrated interest in learning. Expect positive outcomes  Education material provided: ADA - How to Thrive: A Guide for Your Journey with Diabetes, Food label handouts, Meal plan card, Snack sheet, and Diabetes Resources  If problems or questions, patient to contact team via:  Phone  Future DSME appointment: 3-4 months

## 2022-01-07 NOTE — Patient Instructions (Addendum)
Tips to share your information with your MD practice:  You will need a practice ID number (does your MD office use the Libreview portal?) ?Add the [employer] app to your phone and sign in (or sign up). ?Receive the email and hit the verify email button. ?In the [employer], click on the 3 lines, connect to the apps, then enter the practice ID. ? ?Calorie [employer] app   ? ? ?Aim for 3 Carb Choices per meal (45 grams) +/- 1 either way  ?Aim for 0-1 Carbs per snack if hungry  ?Include protein in moderation with your meals and snacks ?Consider reading food labels for Total Carbohydrate of foods ?Consider  increasing your activity level by walking for 30 minutes daily as tolerated ?Continue checking BG at alternate times per day  ?Continue taking medication as directed by MD ? ? ? ? ?

## 2022-01-11 LAB — HM DIABETES EYE EXAM

## 2022-01-22 ENCOUNTER — Encounter: Payer: Self-pay | Admitting: Internal Medicine

## 2022-01-22 ENCOUNTER — Ambulatory Visit: Payer: Managed Care, Other (non HMO) | Admitting: Internal Medicine

## 2022-01-22 VITALS — BP 140/76 | HR 93 | Temp 97.8°F | Ht 70.0 in | Wt 231.0 lb

## 2022-01-22 DIAGNOSIS — D693 Immune thrombocytopenic purpura: Secondary | ICD-10-CM

## 2022-01-22 DIAGNOSIS — E118 Type 2 diabetes mellitus with unspecified complications: Secondary | ICD-10-CM

## 2022-01-22 DIAGNOSIS — I10 Essential (primary) hypertension: Secondary | ICD-10-CM

## 2022-01-22 DIAGNOSIS — E538 Deficiency of other specified B group vitamins: Secondary | ICD-10-CM | POA: Diagnosis not present

## 2022-01-22 NOTE — Patient Instructions (Signed)
Type 2 Diabetes Mellitus, Diagnosis, Adult ?Type 2 diabetes (type 2 diabetes mellitus) is a long-term, or chronic, disease. In type 2 diabetes, one or both of these problems may be present: ?The pancreas does not make enough of a hormone called insulin. ?Cells in the body do not respond properly to the insulin that the body makes (insulin resistance). ?Normally, insulin allows blood sugar (glucose) to enter cells in the body. The cells use glucose for energy. Insulin resistance or lack of insulin causes excess glucose to build up in the blood instead of going into cells. This causes high blood glucose (hyperglycemia).  ?What are the causes? ?The exact cause of type 2 diabetes is not known. ?What increases the risk? ?The following factors may make you more likely to develop this condition: ?Having a family member with type 2 diabetes. ?Being overweight or obese. ?Being inactive (sedentary). ?Having been diagnosed with insulin resistance. ?Having a history of prediabetes, diabetes when you were pregnant (gestational diabetes), or polycystic ovary syndrome (PCOS). ?What are the signs or symptoms? ?In the early stage of this condition, you may not have symptoms. Symptoms develop slowly and may include: ?Increased thirst or hunger. ?Increased urination. ?Unexplained weight loss. ?Tiredness (fatigue) or weakness. ?Vision changes, such as blurry vision. ?Dark patches on the skin. ?How is this diagnosed? ?This condition is diagnosed based on your symptoms, your medical history, a physical exam, and your blood glucose level. Your blood glucose may be checked with one or more of the following blood tests: ?A fasting blood glucose (FBG) test. You will not be allowed to eat (you will fast) for 8 hours or longer before a blood sample is taken. ?A random blood glucose test. This test checks blood glucose at any time of day regardless of when you ate. ?An A1C (hemoglobin A1C) blood test. This test provides information about blood  glucose levels over the previous 2-3 months. ?An oral glucose tolerance test (OGTT). This test measures your blood glucose at two times: ?After fasting. This is your baseline blood glucose level. ?Two hours after drinking a beverage that contains glucose. ?You may be diagnosed with type 2 diabetes if: ?Your fasting blood glucose level is 126 mg/dL (7.0 mmol/L) or higher. ?Your random blood glucose level is 200 mg/dL (11.1 mmol/L) or higher. ?Your A1C level is 6.5% or higher. ?Your oral glucose tolerance test result is higher than 200 mg/dL (11.1 mmol/L). ?These blood tests may be repeated to confirm your diagnosis. ?How is this treated? ?Your treatment may be managed by a specialist called an endocrinologist. Type 2 diabetes may be treated by following instructions from your health care provider about: ?Making dietary and lifestyle changes. These may include: ?Following a personalized nutrition plan that is developed by a registered dietitian. ?Exercising regularly. ?Finding ways to manage stress. ?Checking your blood glucose level as often as told. ?Taking diabetes medicines or insulin daily. This helps to keep your blood glucose levels in the healthy range. ?Taking medicines to help prevent complications from diabetes. Medicines may include: ?Aspirin. ?Medicine to lower cholesterol. ?Medicine to control blood pressure. ?Your health care provider will set treatment goals for you. Your goals will be based on your age, other medical conditions you have, and how you respond to diabetes treatment. Generally, the goal of treatment is to maintain the following blood glucose levels: ?Before meals: 80-130 mg/dL (4.4-7.2 mmol/L). ?After meals: below 180 mg/dL (10 mmol/L). ?A1C level: less than 7%. ?Follow these instructions at home: ?Questions to ask your health care provider ?  Consider asking the following questions: ?Should I meet with a certified diabetes care and education specialist? ?What diabetes medicines do I need,  and when should I take them? ?What equipment will I need to manage my diabetes at home? ?How often do I need to check my blood glucose? ?Where can I find a support group for people with diabetes? ?What number can I call if I have questions? ?When is my next appointment? ?General instructions ?Take over-the-counter and prescription medicines only as told by your health care provider. ?Keep all follow-up visits. This is important. ?Where to find more information ?For help and guidance and for more information about diabetes, please visit: ?American Diabetes Association (ADA): www.diabetes.org ?American Association of Diabetes Care and Education Specialists (ADCES): www.diabeteseducator.org ?International Diabetes Federation (IDF): www.idf.org ?Contact a health care provider if: ?Your blood glucose is at or above 240 mg/dL (13.3 mmol/L) for 2 days in a row. ?You have been sick or have had a fever for 2 days or longer, and you are not getting better. ?You have any of the following problems for more than 6 hours: ?You cannot eat or drink. ?You have nausea and vomiting. ?You have diarrhea. ?Get help right away if: ?You have severe hypoglycemia. This means your blood glucose is lower than 54 mg/dL (3.0 mmol/L). ?You become confused or you have trouble thinking clearly. ?You have difficulty breathing. ?You have moderate or large ketone levels in your urine. ?These symptoms may represent a serious problem that is an emergency. Do not wait to see if the symptoms will go away. Get medical help right away. Call your local emergency services (911 in the [place] Do not drive yourself to the hospital. ?Summary ?Type 2 diabetes mellitus is a long-term, or chronic, disease. In type 2 diabetes, the pancreas does not make enough of a hormone called insulin, or cells in the body do not respond properly to insulin that the body makes. ?This condition is treated by making dietary and lifestyle changes and taking diabetes medicines or  insulin. ?Your health care provider will set treatment goals for you. Your goals will be based on your age, other medical conditions you have, and how you respond to diabetes treatment. ?Keep all follow-up visits. This is important. ?This information is not intended to replace advice given to you by your health care provider. Make sure you discuss any questions you have with your health care provider. ?Document Revised: 01/08/2021 Document Reviewed: 01/08/2021 ?Elsevier Patient Education ? 2022 Elsevier Inc. ? ?

## 2022-01-22 NOTE — Progress Notes (Signed)
? ?Subjective:  ?Patient ID: Alex [name], male    DOB: [birth year]  Age: 66 y.o. MRN: [id number] ? ?CC: Hypertension and Diabetes ? ?This visit occurred during the SARS-CoV-2 public health emergency.  Safety protocols were in place, including screening questions prior to the visit, additional usage of staff PPE, and extensive cleaning of exam room while observing appropriate contact time as indicated for disinfecting solutions.   ? ?HPI ?Alex [name] presents for f/up - ? ?He tells me his blood sugar and blood pressure are well controlled.  He is active and denies chest pain, shortness of breath, diaphoresis, dizziness, or lightheadedness.  He is not willing to get vaccinated against influenza, pneumonia, or shingles. ? ?Outpatient Medications Prior to Visit  ?Medication Sig Dispense Refill  ? celecoxib (CELEBREX) 200 MG capsule Take 200 mg by mouth daily as needed.    ? Cholecalciferol (VITAMIN D3) 50 MCG (2000 UT) TABS TAKE 1 TABLET BY MOUTH DAILY 90 tablet 1  ? Continuous Blood Gluc Receiver (FREESTYLE LIBRE 2 READER) DEVI 1 Act by Does not apply route daily. 2 each 5  ? Continuous Blood Gluc Sensor (FREESTYLE LIBRE 2 SENSOR) MISC 1 Act by Does not apply route daily. 2 each 5  ? esomeprazole (NEXIUM) 40 MG capsule TAKE 1 CAPSULE BY MOUTH EVERY MORNING BEFORE BREAKFAST 30 capsule 5  ? fexofenadine (ALLEGRA) 180 MG tablet Take 180 mg by mouth daily.    ? Insulin Glargine-Lixisenatide (SOLIQUA) 100-33 UNT-MCG/ML SOPN Inject 20 Units into the skin daily. 54 mL 1  ? Insulin Pen Needle 32G X 6 MM MISC 1 Act by Does not apply route daily. 100 each 1  ? irbesartan (AVAPRO) 300 MG tablet TAKE 1 TABLET(300 MG) BY MOUTH DAILY 90 tablet 0  ? pioglitazone (ACTOS) 15 MG tablet TAKE 1 TABLET(15 MG) BY MOUTH DAILY 90 tablet 0  ? potassium chloride SA (KLOR-CON M) 20 MEQ tablet TAKE 1 TABLET(20 MEQ) BY MOUTH DAILY 90 tablet 0  ? psyllium (REGULOID) 0.52 g capsule Take 0.52 g by mouth daily.    ? rosuvastatin (CRESTOR)  10 MG tablet TAKE 1 TABLET(10 MG) BY MOUTH DAILY 90 tablet 1  ? triamterene-hydrochlorothiazide (DYAZIDE) 37.5-25 MG capsule TAKE 1 CAPSULE BY MOUTH DAILY 90 capsule 0  ? metFORMIN (GLUCOPHAGE XR) 750 MG 24 hr tablet Take 2 tablets (1,500 mg total) by mouth daily with breakfast. 180 tablet 0  ? methylPREDNISolone (MEDROL DOSEPAK) 4 MG TBPK tablet 24 mg PO on day 1, then decr. by 4 mg/day x5 days 21 tablet 0  ? ?No facility-administered medications prior to visit.  ? ? ?ROS ?Review of Systems  ?Constitutional:  Negative for diaphoresis and fatigue.  ?HENT: Negative.    ?Eyes: Negative.   ?Respiratory:  Negative for cough, chest tightness, shortness of breath and wheezing.   ?Cardiovascular:  Negative for chest pain, palpitations and leg swelling.  ?Gastrointestinal:  Negative for abdominal pain, constipation, diarrhea, nausea and vomiting.  ?Endocrine: Negative.   ?Genitourinary: Negative.  Negative for difficulty urinating and dysuria.  ?Musculoskeletal: Negative.  Negative for myalgias.  ?Skin: Negative.   ?Neurological:  Negative for dizziness, weakness, light-headedness and headaches.  ?Hematological:  Negative for adenopathy. Does not bruise/bleed easily.  ?Psychiatric/Behavioral: Negative.    ? ?Objective:  ?BP 140/76 (BP Location: Left Arm, Patient Position: Sitting, Cuff Size: Large)   Pulse 93   Temp 97.8 ?F (36.6 ?C) (Oral)   Ht 5' 10"  (1.778 m)   Wt 231 lb (104.8 kg)  SpO2 96%   BMI 33.15 kg/m?  ? ?BP Readings from Last 3 Encounters:  ?01/22/22 140/76  ?11/14/21 (!) 144/84  ?10/05/21 138/82  ? ? ?Wt Readings from Last 3 Encounters:  ?01/22/22 231 lb (104.8 kg)  ?01/07/22 228 lb (103.4 kg)  ?11/14/21 230 lb (104.3 kg)  ? ? ?Physical Exam ?Vitals reviewed.  ?HENT:  ?   Nose: Nose normal.  ?   Mouth/Throat:  ?   Mouth: Mucous membranes are moist.  ?Eyes:  ?   General: No scleral icterus. ?   Conjunctiva/sclera: Conjunctivae normal.  ?Cardiovascular:  ?   Rate and Rhythm: Normal rate and regular rhythm.   ?   Heart sounds: No murmur heard. ?Pulmonary:  ?   Effort: Pulmonary effort is normal.  ?   Breath sounds: No stridor. No wheezing, rhonchi or rales.  ?Abdominal:  ?   General: Abdomen is flat.  ?   Palpations: There is no mass.  ?   Tenderness: There is no abdominal tenderness. There is no guarding.  ?   Hernia: No hernia is present.  ?Musculoskeletal:     ?   General: Normal range of motion.  ?   Cervical back: Neck supple.  ?   Right lower leg: No edema.  ?   Left lower leg: No edema.  ?Lymphadenopathy:  ?   Cervical: No cervical adenopathy.  ?Skin: ?   General: Skin is warm and dry.  ?Neurological:  ?   General: No focal deficit present.  ?   Mental Status: He is alert.  ? ? ?Lab Results  ?Component Value Date  ? WBC 4.9 01/22/2022  ? HGB 15.9 01/22/2022  ? HCT 47.0 01/22/2022  ? PLT 141.0 (L) 01/22/2022  ? GLUCOSE 139 (H) 01/22/2022  ? CHOL 177 04/05/2021  ? TRIG 129.0 04/05/2021  ? HDL 42.00 04/05/2021  ? LDLDIRECT 146.5 12/05/2010  ? LDLCALC 109 (H) 04/05/2021  ? ALT 53 11/14/2021  ? AST 38 (H) 11/14/2021  ? NA 136 01/22/2022  ? K 3.9 01/22/2022  ? CL 101 01/22/2022  ? CREATININE 1.00 01/22/2022  ? BUN 24 (H) 01/22/2022  ? CO2 25 01/22/2022  ? TSH 2.84 04/05/2021  ? PSA 0.62 04/05/2021  ? INR 1.0 11/14/2021  ? HGBA1C 7.1 (H) 01/22/2022  ? MICROALBUR <0.7 01/22/2022  ? ? ?CT Chest Wo Contrast ? ?Result Date: 07/28/2019 ?CLINICAL DATA:  Lung nodule, less than 1 cm. EXAM: CT CHEST WITHOUT CONTRAST TECHNIQUE: Multidetector CT imaging of the chest was performed following the standard protocol without IV contrast. COMPARISON:  CT chest July 10, 2018 FINDINGS: Cardiovascular: Scattered atherosclerosis and signs of coronary artery disease. Extensive coronary artery calcifications including left main and 3 vessels along with right coronary circulation. No pericardial effusion. Central pulmonary arteries are normal caliber. Vessels not well evaluated given lack of contrast. Mediastinum/Nodes: No signs of  mediastinal lymphadenopathy. No axillary or hilar lymphadenopathy.  No thoracic inlet adenopathy. Lungs/Pleura: No signs effusion or consolidation. Scattered calcified nodules. No new pulmonary nodules. 4 mm right lower lobe pulmonary nodule unchanged (image 87, series 3) 5 x 5 mm nodule in the left costodiaphragmatic sulcus, stable when measured by this observer on the previous exam. Triangular perifissural opacity right chest along the minor fissure likely small subpleural lymph node slightly larger than on the prior study. Subtle ground-glass without focal characteristics present in the superior segment of the right lower lobe. Airways are patent. Upper Abdomen: Lobular liver contour with signs of fissural retraction/widening. Visualized  spleen is likely mildly enlarged between 12 and 13 cm. Musculoskeletal: No signs of acute bone finding or destructive bone process. IMPRESSION: 1. Small approximately 5 mm pulmonary nodules are demonstrated in the lung bases as described. No change with direct measurement comparison. 2. Atherosclerosis and coronary artery disease. 3. Lobular hepatic contour raising the question of liver disease. Clinical and laboratory correlation may be helpful spleen is also at the upper limits of normal to slightly enlarged at approximately 12-13 cm. Aortic Atherosclerosis (ICD10-I70.0). Electronically Signed   By: Zetta Bills M.D.   On: 07/28/2019 10:10  ? ? ?Assessment & Plan:  ? ?Alex [name] was seen today for hypertension and diabetes. ? ?Diagnoses and all orders for this visit: ? ?Essential hypertension- His blood pressure is adequately well controlled.  Electrolytes and renal function are normal. ?-     Urinalysis, Routine w reflex microscopic; Future ?-     Basic metabolic panel; Future ?-     Basic metabolic panel ?-     Urinalysis, Routine w reflex microscopic ? ?Type II diabetes mellitus with manifestations ([place])- His A1c is at 7.1%.  I have asked him to continue the current  regimen. ?-     Cancel: Hemoglobin A1c; Future ?-     Microalbumin / creatinine urine ratio; Future ?-     Basic metabolic panel; Future ?-     Hemoglobin A1c; Future ?-     Hemoglobin A1c ?-     Basic metabolic panel ?-

## 2022-01-23 ENCOUNTER — Encounter: Payer: Self-pay | Admitting: Internal Medicine

## 2022-01-23 LAB — MICROALBUMIN / CREATININE URINE RATIO
Creatinine,U: 96.6 mg/dL
Microalb Creat Ratio: 0.7 mg/g (ref 0.0–30.0)
Microalb, Ur: <0.7 mg/dL (ref 0.0–1.9)

## 2022-01-23 LAB — URINALYSIS, ROUTINE W REFLEX MICROSCOPIC
Bilirubin Urine: NEGATIVE
Hgb urine dipstick: NEGATIVE
Ketones, ur: NEGATIVE
Leukocytes,Ua: NEGATIVE
Nitrite: NEGATIVE
RBC / HPF: NONE SEEN (ref 0–?)
Specific Gravity, Urine: 1.025 (ref 1.000–1.030)
Total Protein, Urine: NEGATIVE
Urine Glucose: NEGATIVE
Urobilinogen, UA: 0.2 (ref 0.0–1.0)
WBC, UA: NONE SEEN (ref 0–?)
pH: 5.5 (ref 5.0–8.0)

## 2022-01-23 LAB — CBC WITH DIFFERENTIAL/PLATELET
Basophils Absolute: 0 10*3/uL (ref 0.0–0.1)
Basophils Relative: 0.6 % (ref 0.0–3.0)
Eosinophils Absolute: 0.1 10*3/uL (ref 0.0–0.7)
Eosinophils Relative: 2.2 % (ref 0.0–5.0)
HCT: 47 % (ref 39.0–52.0)
Hemoglobin: 15.9 g/dL (ref 13.0–17.0)
Lymphocytes Relative: 28.1 % (ref 12.0–46.0)
Lymphs Abs: 1.4 10*3/uL (ref 0.7–4.0)
MCHC: 33.7 g/dL (ref 30.0–36.0)
MCV: 96.7 fl (ref 78.0–100.0)
Monocytes Absolute: 0.5 10*3/uL (ref 0.1–1.0)
Monocytes Relative: 10.7 % (ref 3.0–12.0)
Neutro Abs: 2.9 10*3/uL (ref 1.4–7.7)
Neutrophils Relative %: 58.4 % (ref 43.0–77.0)
Platelets: 141 10*3/uL — ABNORMAL LOW (ref 150.0–400.0)
RBC: 4.86 Mil/uL (ref 4.22–5.81)
RDW: 12.8 % (ref 11.5–15.5)
WBC: 4.9 10*3/uL (ref 4.0–10.5)

## 2022-01-23 LAB — BASIC METABOLIC PANEL
BUN: 24 mg/dL — ABNORMAL HIGH (ref 6–23)
CO2: 25 mEq/L (ref 19–32)
Calcium: 9.5 mg/dL (ref 8.4–10.5)
Chloride: 101 mEq/L (ref 96–112)
Creatinine, Ser: 1 mg/dL (ref 0.40–1.50)
GFR: 79.44 mL/min (ref >60.00)
Glucose, Bld: 139 mg/dL — ABNORMAL HIGH (ref 70–99)
Potassium: 3.9 mEq/L (ref 3.5–5.1)
Sodium: 136 mEq/L (ref 135–145)

## 2022-01-23 LAB — VITAMIN B12: Vitamin B-12: 350 pg/mL (ref 211–911)

## 2022-01-23 LAB — HEMOGLOBIN A1C: Hgb A1c MFr Bld: 7.1 % — ABNORMAL HIGH (ref 4.6–6.5)

## 2022-01-26 ENCOUNTER — Other Ambulatory Visit: Payer: Self-pay | Admitting: Internal Medicine

## 2022-01-26 DIAGNOSIS — E118 Type 2 diabetes mellitus with unspecified complications: Secondary | ICD-10-CM

## 2022-01-26 DIAGNOSIS — E119 Type 2 diabetes mellitus without complications: Secondary | ICD-10-CM

## 2022-01-30 ENCOUNTER — Ambulatory Visit (INDEPENDENT_AMBULATORY_CARE_PROVIDER_SITE_OTHER): Payer: Managed Care, Other (non HMO) | Admitting: *Deleted

## 2022-01-30 DIAGNOSIS — E538 Deficiency of other specified B group vitamins: Secondary | ICD-10-CM

## 2022-01-30 DIAGNOSIS — D693 Immune thrombocytopenic purpura: Secondary | ICD-10-CM | POA: Diagnosis not present

## 2022-01-30 MED ORDER — CYANOCOBALAMIN 1000 MCG/ML IJ SOLN
1000.0000 ug | Freq: Once | INTRAMUSCULAR | Status: AC
  Administered 2022-01-30: 1000 ug via INTRAMUSCULAR

## 2022-01-30 NOTE — Progress Notes (Addendum)
Patient here for b12 injection. Given in left deltoid. Patient tolerated well ? ?Please co sign  ? ? ?

## 2022-02-18 ENCOUNTER — Other Ambulatory Visit: Payer: Self-pay | Admitting: Internal Medicine

## 2022-02-18 DIAGNOSIS — I2510 Atherosclerotic heart disease of native coronary artery without angina pectoris: Secondary | ICD-10-CM

## 2022-02-18 DIAGNOSIS — I10 Essential (primary) hypertension: Secondary | ICD-10-CM

## 2022-02-25 ENCOUNTER — Other Ambulatory Visit: Payer: Self-pay | Admitting: Internal Medicine

## 2022-02-25 DIAGNOSIS — I10 Essential (primary) hypertension: Secondary | ICD-10-CM

## 2022-02-25 DIAGNOSIS — E876 Hypokalemia: Secondary | ICD-10-CM

## 2022-02-28 ENCOUNTER — Ambulatory Visit: Payer: Managed Care, Other (non HMO)

## 2022-03-13 ENCOUNTER — Ambulatory Visit (INDEPENDENT_AMBULATORY_CARE_PROVIDER_SITE_OTHER): Payer: Managed Care, Other (non HMO)

## 2022-03-13 DIAGNOSIS — E538 Deficiency of other specified B group vitamins: Secondary | ICD-10-CM

## 2022-03-13 MED ORDER — CYANOCOBALAMIN 1000 MCG/ML IJ SOLN
1000.0000 ug | Freq: Once | INTRAMUSCULAR | Status: AC
  Administered 2022-03-13: 1000 ug via INTRAMUSCULAR

## 2022-03-13 NOTE — Progress Notes (Signed)
Pt here for monthly B12 injection per Dr. Ronnald Ramp ? ?B12 1074mg given IM, and pt tolerated injection well. ? ?Next B12 injection scheduled for 04/17/22 ?

## 2022-03-27 ENCOUNTER — Other Ambulatory Visit: Payer: Self-pay | Admitting: Internal Medicine

## 2022-03-27 DIAGNOSIS — E118 Type 2 diabetes mellitus with unspecified complications: Secondary | ICD-10-CM

## 2022-03-27 DIAGNOSIS — K7581 Nonalcoholic steatohepatitis (NASH): Secondary | ICD-10-CM

## 2022-04-01 ENCOUNTER — Encounter: Payer: Self-pay | Admitting: Dietician

## 2022-04-01 ENCOUNTER — Encounter: Payer: Managed Care, Other (non HMO) | Attending: Internal Medicine | Admitting: Dietician

## 2022-04-01 DIAGNOSIS — E118 Type 2 diabetes mellitus with unspecified complications: Secondary | ICD-10-CM | POA: Insufficient documentation

## 2022-04-01 NOTE — Progress Notes (Signed)
Diabetes Self-Management Education  Visit Type: Follow-up  Appt. Start Time: 1500 Appt. End Time: 2979  04/01/2022  [name], identified by name and date of birth, is a 66 y.o. male with a diagnosis of Diabetes:  .   ASSESSMENT Patient is here today alone.  He was last seen by this RD 01/07/2022. Stopped sweets and sugar containing beverages since being diagnosed with diabetes in January. Eats a lot of yogurt ([place]) with berries He is not exercising beyond work and walks all day at work.  He is retiring 9/29/ 2023. Downloaded the [employer] and uses when making decisions when eating out. Wearing the [employer] - time in range 93% and high 11% based on the past 1-2 weeks.  Sensor use decreased to 66% and patient educated to scan more consistently particularly when he wakes, before bed and before and after each meal. His A1C has dropped from 10.2% to 7.1% He downloaded the [employer] app and patient uses this occasionally when making fast food choices.  Discussed fast food choices further today. Fasting blood glucose <150 Continues to eat out 3 times daily and patient states this will change when he retires. Weight has increased.  Discussed possible causes. Discussed further mindfulness when eating out.  History includes Type 2 Diabetes, GERD, OSA (no c-pap), HLD, HTN Medications include:  Metformin, 20 units Soliqua q am, pioglitazone, potassium A1C 10.4% 11/14/2021   Patient lives with his wife.  She does most of the shopping and cooking. He starts work at [employer] each am.  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  Weight 234 lb (106.1 kg). Body mass index is 33.58 kg/m.   Diabetes Self-Management Education - 04/01/22 1741       Visit Information   Visit Type Follow-up      Psychosocial Assessment   What is the hardest part about your diabetes right now, causing you the most concern, or is the most worrisome to you about your  diabetes?   Making healty food and beverage choices    Self-care barriers None    Other persons present Patient    Patient Concerns Nutrition/Meal planning;Glycemic Control      Pre-Education Assessment   Patient understands the diabetes disease and treatment process. Comprehends key points    Patient understands incorporating nutritional management into lifestyle. Needs Review    Patient undertands incorporating physical activity into lifestyle. Comprehends key points    Patient understands using medications safely. Demonstrates understanding / competency    Patient understands monitoring blood glucose, interpreting and using results Comprehends key points    Patient understands prevention, detection, and treatment of acute complications. Comprehends key points    Patient understands prevention, detection, and treatment of chronic complications. Demonstrates understanding / competency    Patient understands how to develop strategies to address psychosocial issues. Demonstrates understanding / competency    Patient understands how to develop strategies to promote health/change behavior. Needs Review      Complications   Last HgB A1C per patient/outside source 7.1 %    How often do you check your blood sugar? > 4 times/day    Fasting Blood glucose range (mg/dL) 70-129;130-179    Postprandial Blood glucose range (mg/dL) >200;180-200;130-179    Number of hypoglycemic episodes per month 0    Number of hyperglycemic episodes ( >230m/dL): Weekly    Can you tell when your blood sugar is high? No  Dietary Intake   Breakfast 2 cups plain cheerios with low fat milk    Snack (morning) bacon, egg, cheese biscuit    Lunch cobb salad at [employer] OR chicken salad [employer] OR country BBQ    Beverage(s) water, unsweetened tea      Activity / Exercise   Activity / Exercise Type Light (walking / raking leaves)   at work     Patient Education   Previous Diabetes  Education Yes (please comment)   12/2021   Healthy Eating Meal options for control of blood glucose level and chronic complications.;Information on hints to eating out and maintain blood glucose control.    Medications Reviewed patients medication for diabetes, action, purpose, timing of dose and side effects.    Monitoring Identified appropriate SMBG and/or A1C goals.;Taught/evaluated CGM (comment)      Individualized Goals (developed by patient)   Nutrition General guidelines for healthy choices and portions discussed    Physical Activity Exercise 3-5 times per week;30 minutes per day    Medications take my medication as prescribed    Monitoring  Consistenly use CGM   scan more frequently   Problem Solving Addressing barriers to behavior change      Patient Self-Evaluation of Goals - Patient rates self as meeting previously set goals (% of time)   Nutrition 25 - 50% (sometimes)    Physical Activity 25 - 50% (sometimes)    Medications >75% (most of the time)    Monitoring >75% (most of the time)    Problem Solving and behavior change strategies  25 - 50% (sometimes)    Reducing Risk (treating acute and chronic complications) >54% (most of the time)    Health Coping >75% (most of the time)      Post-Education Assessment   Patient understands the diabetes disease and treatment process. Demonstrates understanding / competency    Patient understands incorporating nutritional management into lifestyle. Comprehends key points    Patient undertands incorporating physical activity into lifestyle. Comprehends key points    Patient understands using medications safely. Demonstrates understanding / competency    Patient understands monitoring blood glucose, interpreting and using results Demonstrates understanding / competency    Patient understands prevention, detection, and treatment of acute complications. Demonstrates understanding / competency    Patient understands prevention, detection, and  treatment of chronic complications. Demonstrates understanding / competency    Patient understands how to develop strategies to address psychosocial issues. Demonstrates understanding / competency    Patient understands how to develop strategies to promote health/change behavior. Comprehends key points      Outcomes   Expected Outcomes Demonstrated interest in learning. Expect positive outcomes    Future DMSE PRN    Program Status Completed      Subsequent Visit   Since your last visit have you continued or begun to take your medications as prescribed? Yes    Since your last visit have you experienced any weight changes? Gain    Weight Gain (lbs) 6    Since your last visit, are you checking your blood glucose at least once a day? Yes             Individualized Plan for Diabetes Self-Management Training:   Learning Objective:  Patient will have a greater understanding of diabetes self-management. Patient education plan is to attend individual and/or group sessions per assessed needs and concerns.   Plan:   Patient Instructions  Great job on changes  made! Be mindful when eating out  English muffin with egg and grilled chicken   Salad from [employer] Resistant starch effect your blood sugar less  Chilled potatoes  Frozen and or toasted bread Limit snacks to times you are hungry Scan your Elenor Legato more frequently Expected Outcomes:  Demonstrated interest in learning. Expect positive outcomes  Education material provided:   If problems or questions, patient to contact team via:  Phone  Future DSME appointment: PRN

## 2022-04-01 NOTE — Patient Instructions (Signed)
Great job on changes made! Be mindful when eating out  English muffin with egg and grilled chicken   Salad from [employer] Resistant starch effect your blood sugar less  Chilled potatoes  Frozen and or toasted bread Limit snacks to times you are hungry Scan your Alex [name] more frequently

## 2022-04-08 ENCOUNTER — Ambulatory Visit: Payer: Managed Care, Other (non HMO) | Admitting: Dietician

## 2022-04-09 ENCOUNTER — Encounter: Payer: Self-pay | Admitting: Internal Medicine

## 2022-04-09 ENCOUNTER — Ambulatory Visit (INDEPENDENT_AMBULATORY_CARE_PROVIDER_SITE_OTHER): Payer: Managed Care, Other (non HMO) | Admitting: Internal Medicine

## 2022-04-09 VITALS — BP 160/82 | HR 76 | Temp 98.0°F | Resp 16 | Ht 70.0 in | Wt 233.0 lb

## 2022-04-09 DIAGNOSIS — E538 Deficiency of other specified B group vitamins: Secondary | ICD-10-CM

## 2022-04-09 DIAGNOSIS — Z0000 Encounter for general adult medical examination without abnormal findings: Secondary | ICD-10-CM | POA: Diagnosis not present

## 2022-04-09 DIAGNOSIS — Z794 Long term (current) use of insulin: Secondary | ICD-10-CM

## 2022-04-09 DIAGNOSIS — Z0001 Encounter for general adult medical examination with abnormal findings: Secondary | ICD-10-CM

## 2022-04-09 DIAGNOSIS — E119 Type 2 diabetes mellitus without complications: Secondary | ICD-10-CM

## 2022-04-09 DIAGNOSIS — I10 Essential (primary) hypertension: Secondary | ICD-10-CM

## 2022-04-09 DIAGNOSIS — E118 Type 2 diabetes mellitus with unspecified complications: Secondary | ICD-10-CM | POA: Diagnosis not present

## 2022-04-09 DIAGNOSIS — E785 Hyperlipidemia, unspecified: Secondary | ICD-10-CM

## 2022-04-09 DIAGNOSIS — R351 Nocturia: Secondary | ICD-10-CM | POA: Diagnosis not present

## 2022-04-09 DIAGNOSIS — D693 Immune thrombocytopenic purpura: Secondary | ICD-10-CM

## 2022-04-09 DIAGNOSIS — N401 Enlarged prostate with lower urinary tract symptoms: Secondary | ICD-10-CM | POA: Diagnosis not present

## 2022-04-09 LAB — CBC WITH DIFFERENTIAL/PLATELET
Basophils Absolute: 0 10*3/uL (ref 0.0–0.1)
Basophils Relative: 1.1 % (ref 0.0–3.0)
Eosinophils Absolute: 0.1 10*3/uL (ref 0.0–0.7)
Eosinophils Relative: 2.2 % (ref 0.0–5.0)
HCT: 45.9 % (ref 39.0–52.0)
Hemoglobin: 15.5 g/dL (ref 13.0–17.0)
Lymphocytes Relative: 24.7 % (ref 12.0–46.0)
Lymphs Abs: 1 10*3/uL (ref 0.7–4.0)
MCHC: 33.8 g/dL (ref 30.0–36.0)
MCV: 95.9 fl (ref 78.0–100.0)
Monocytes Absolute: 0.4 10*3/uL (ref 0.1–1.0)
Monocytes Relative: 9.2 % (ref 3.0–12.0)
Neutro Abs: 2.5 10*3/uL (ref 1.4–7.7)
Neutrophils Relative %: 62.8 % (ref 43.0–77.0)
Platelets: 120 10*3/uL — ABNORMAL LOW (ref 150.0–400.0)
RBC: 4.79 Mil/uL (ref 4.22–5.81)
RDW: 12.8 % (ref 11.5–15.5)
WBC: 3.9 10*3/uL — ABNORMAL LOW (ref 4.0–10.5)

## 2022-04-09 LAB — PSA: PSA: 0.91 ng/mL (ref 0.10–4.00)

## 2022-04-09 LAB — LIPID PANEL
Cholesterol: 121 mg/dL (ref 0–200)
HDL: 40.3 mg/dL (ref >39.00)
LDL Cholesterol: 61 mg/dL (ref 0–99)
NonHDL: 80.55
Total CHOL/HDL Ratio: 3
Triglycerides: 98 mg/dL (ref 0.0–149.0)
VLDL: 19.6 mg/dL (ref 0.0–40.0)

## 2022-04-09 LAB — HEMOGLOBIN A1C: Hgb A1c MFr Bld: 6.4 % (ref 4.6–6.5)

## 2022-04-09 LAB — BASIC METABOLIC PANEL
BUN: 20 mg/dL (ref 6–23)
CO2: 26 mEq/L (ref 19–32)
Calcium: 9.4 mg/dL (ref 8.4–10.5)
Chloride: 102 mEq/L (ref 96–112)
Creatinine, Ser: 0.86 mg/dL (ref 0.40–1.50)
GFR: 91.26 mL/min (ref >60.00)
Glucose, Bld: 127 mg/dL — ABNORMAL HIGH (ref 70–99)
Potassium: 4 mEq/L (ref 3.5–5.1)
Sodium: 137 mEq/L (ref 135–145)

## 2022-04-09 NOTE — Progress Notes (Signed)
Subjective:  Patient ID: Alex [name], male    DOB: 08-13-1956  Age: 66 y.o. MRN: [id number]  CC: Annual Exam, Hypertension, Hyperlipidemia, and Diabetes   HPI MAHAMUD METTS presents for a CPX and f/up -   He has felt well recently and offers no complaints.  He is active and denies chest pain, shortness of breath, diaphoresis, dizziness, lightheadedness, or edema.  Outpatient Medications Prior to Visit  Medication Sig Dispense Refill   celecoxib (CELEBREX) 200 MG capsule Take 200 mg by mouth daily as needed.     Cholecalciferol (VITAMIN D3) 50 MCG (2000 UT) TABS TAKE 1 TABLET BY MOUTH DAILY 90 tablet 1   Continuous Blood Gluc Receiver (FREESTYLE LIBRE 2 READER) DEVI 1 Act by Does not apply route daily. 2 each 5   Continuous Blood Gluc Sensor (FREESTYLE LIBRE 2 SENSOR) MISC 1 Act by Does not apply route daily. 2 each 5   esomeprazole (NEXIUM) 40 MG capsule TAKE 1 CAPSULE BY MOUTH EVERY MORNING BEFORE BREAKFAST 30 capsule 5   fexofenadine (ALLEGRA) 180 MG tablet Take 180 mg by mouth daily.     Insulin Glargine-Lixisenatide (SOLIQUA) 100-33 UNT-MCG/ML SOPN Inject 20 Units into the skin daily. 54 mL 1   Insulin Pen Needle 32G X 6 MM MISC 1 Act by Does not apply route daily. 100 each 1   irbesartan (AVAPRO) 300 MG tablet TAKE 1 TABLET(300 MG) BY MOUTH DAILY 90 tablet 0   metFORMIN (GLUCOPHAGE-XR) 750 MG 24 hr tablet TAKE 2 TABLETS(1500 MG) BY MOUTH DAILY WITH BREAKFAST 180 tablet 0   pioglitazone (ACTOS) 15 MG tablet TAKE 1 TABLET(15 MG) BY MOUTH DAILY 90 tablet 0   potassium chloride SA (KLOR-CON M) 20 MEQ tablet TAKE 1 TABLET BY MOUTH DAILY 90 tablet 0   psyllium (REGULOID) 0.52 g capsule Take 0.52 g by mouth daily.     rosuvastatin (CRESTOR) 10 MG tablet TAKE 1 TABLET(10 MG) BY MOUTH DAILY 90 tablet 1   triamterene-hydrochlorothiazide (DYAZIDE) 37.5-25 MG capsule TAKE 1 CAPSULE BY MOUTH DAILY 90 capsule 0   No facility-administered medications prior to visit.    ROS Review  of Systems  Constitutional: Negative.  Negative for diaphoresis and fatigue.  HENT: Negative.    Eyes: Negative.   Respiratory: Negative.  Negative for cough, shortness of breath and wheezing.   Cardiovascular:  Negative for chest pain, palpitations and leg swelling.  Gastrointestinal:  Negative for abdominal pain, constipation, diarrhea, nausea and vomiting.  Endocrine: Negative.   Genitourinary: Negative.  Negative for difficulty urinating, dysuria and hematuria.  Musculoskeletal: Negative.  Negative for arthralgias and myalgias.  Skin: Negative.   Neurological:  Negative for dizziness, weakness, light-headedness and headaches.  Hematological:  Negative for adenopathy. Does not bruise/bleed easily.  Psychiatric/Behavioral: Negative.      Objective:  BP (!) 160/82 (BP Location: Left Arm, Patient Position: Sitting, Cuff Size: Large)   Pulse 76   Temp 98 F (36.7 C) (Oral)   Resp 16   Ht 5' 10"  (1.778 m)   Wt 233 lb (105.7 kg)   SpO2 96%   BMI 33.43 kg/m   BP Readings from Last 3 Encounters:  04/09/22 (!) 160/82  01/22/22 140/76  11/14/21 (!) 144/84    Wt Readings from Last 3 Encounters:  04/09/22 233 lb (105.7 kg)  04/01/22 234 lb (106.1 kg)  01/22/22 231 lb (104.8 kg)    Physical Exam Vitals reviewed. Exam conducted with a chaperone present [place] Nurse).  Constitutional:  Appearance: Normal appearance.  HENT:     Nose: Nose normal.     Mouth/Throat:     Mouth: Mucous membranes are moist.  Eyes:     General: No scleral icterus.    Conjunctiva/sclera: Conjunctivae normal.  Cardiovascular:     Rate and Rhythm: Normal rate and regular rhythm.     Heart sounds: Normal heart sounds, S1 normal and S2 normal. No murmur heard.    No friction rub. No gallop.     Comments: EKG- NSR, 74 bpm Inferior infarct pattern is old No LVH Unchanged Pulmonary:     Effort: Pulmonary effort is normal.     Breath sounds: No stridor. No wheezing, rhonchi or rales.   Abdominal:     General: Abdomen is flat.     Palpations: There is no mass.     Tenderness: There is no abdominal tenderness. There is no guarding.     Hernia: No hernia is present. There is no hernia in the left inguinal area or right inguinal area.  Genitourinary:    Pubic Area: No rash.      Penis: Normal and circumcised.      Testes: Normal.     Epididymis:     Right: Normal.     Left: Normal.     Prostate: Enlarged. Not tender and no nodules present.     Rectum: Normal. Guaiac result negative. No mass, tenderness, anal fissure, external hemorrhoid or internal hemorrhoid. Normal anal tone.  Musculoskeletal:        General: Normal range of motion.     Cervical back: Neck supple.     Right lower leg: No edema.     Left lower leg: No edema.  Lymphadenopathy:     Cervical: No cervical adenopathy.     Lower Body: No right inguinal adenopathy. No left inguinal adenopathy.  Skin:    General: Skin is warm and dry.  Neurological:     General: No focal deficit present.     Mental Status: He is alert.  Psychiatric:        Mood and Affect: Mood normal.        Behavior: Behavior normal.     Lab Results  Component Value Date   WBC 3.9 (L) 04/09/2022   HGB 15.5 04/09/2022   HCT 45.9 04/09/2022   PLT 120.0 (L) 04/09/2022   GLUCOSE 127 (H) 04/09/2022   CHOL 121 04/09/2022   TRIG 98.0 04/09/2022   HDL 40.30 04/09/2022   LDLDIRECT 146.5 12/05/2010   LDLCALC 61 04/09/2022   ALT 53 11/14/2021   AST 38 (H) 11/14/2021   NA 137 04/09/2022   K 4.0 04/09/2022   CL 102 04/09/2022   CREATININE 0.86 04/09/2022   BUN 20 04/09/2022   CO2 26 04/09/2022   TSH 2.84 04/05/2021   PSA 0.91 04/09/2022   INR 1.0 11/14/2021   HGBA1C 6.4 04/09/2022   MICROALBUR <0.7 01/22/2022    CT Chest Wo Contrast  Result Date: 07/28/2019 CLINICAL DATA:  Lung nodule, less than 1 cm. EXAM: CT CHEST WITHOUT CONTRAST TECHNIQUE: Multidetector CT imaging of the chest was performed following the standard  protocol without IV contrast. COMPARISON:  CT chest July 10, 2018 FINDINGS: Cardiovascular: Scattered atherosclerosis and signs of coronary artery disease. Extensive coronary artery calcifications including left main and 3 vessels along with right coronary circulation. No pericardial effusion. Central pulmonary arteries are normal caliber. Vessels not well evaluated given lack of contrast. Mediastinum/Nodes: No signs of mediastinal lymphadenopathy. No axillary  or hilar lymphadenopathy.  No thoracic inlet adenopathy. Lungs/Pleura: No signs effusion or consolidation. Scattered calcified nodules. No new pulmonary nodules. 4 mm right lower lobe pulmonary nodule unchanged (image 87, series 3) 5 x 5 mm nodule in the left costodiaphragmatic sulcus, stable when measured by this observer on the previous exam. Triangular perifissural opacity right chest along the minor fissure likely small subpleural lymph node slightly larger than on the prior study. Subtle ground-glass without focal characteristics present in the superior segment of the right lower lobe. Airways are patent. Upper Abdomen: Lobular liver contour with signs of fissural retraction/widening. Visualized spleen is likely mildly enlarged between 12 and 13 cm. Musculoskeletal: No signs of acute bone finding or destructive bone process. IMPRESSION: 1. Small approximately 5 mm pulmonary nodules are demonstrated in the lung bases as described. No change with direct measurement comparison. 2. Atherosclerosis and coronary artery disease. 3. Lobular hepatic contour raising the question of liver disease. Clinical and laboratory correlation may be helpful spleen is also at the upper limits of normal to slightly enlarged at approximately 12-13 cm. Aortic Atherosclerosis (ICD10-I70.0). Electronically Signed   By: Zetta Bills M.D.   On: 07/28/2019 10:10    Assessment & Plan:   Yahir was seen today for annual exam, hypertension, hyperlipidemia and  diabetes.  Diagnoses and all orders for this visit:  Essential hypertension- His blood pressure is not adequately well controlled.  Will check labs to screen for secondary causes and endorgan damage.  His EKG is reassuring.  He will continue to work on his lifestyle modifications. -     CBC with Differential/Platelet; Future -     Basic metabolic panel; Future -     Aldosterone + renin activity w/ ratio; Future -     EKG 12-Lead -     Aldosterone + renin activity w/ ratio -     Basic metabolic panel -     CBC with Differential/Platelet  Type II diabetes mellitus with manifestations ([place])- His A1c is down to 6.4%.  Will discontinue pioglitazone. -     Hemoglobin A1c; Future -     Hemoglobin A1c  Chronic ITP (idiopathic thrombocytopenia) (HCC)- His platelet count is stable.  No bleeding or bruising. -     CBC with Differential/Platelet; Future -     CBC with Differential/Platelet  B12 deficiency -     CBC with Differential/Platelet; Future -     CBC with Differential/Platelet  Hyperlipidemia with target LDL less than 100- LDL goal achieved. Doing well on the statin  -     Lipid panel; Future -     Lipid panel  BPH associated with nocturia- His PSA is normal. -     PSA; Future -     PSA  Encounter for general adult medical examination with abnormal findings- Exam completed, labs reviewed, vaccines reviewed-he deferred on the shingles vaccine and pneumonia vaccine, cancer screenings are up-to-date, patient education was given.   I am having Pierce Crane. Tamera Punt "[name]" maintain his fexofenadine, esomeprazole, celecoxib, Soliqua, Insulin Pen Needle, FreeStyle Libre 2 Sensor, [employer] 2 Reader, rosuvastatin, Vitamin D3, psyllium, metFORMIN, irbesartan, triamterene-hydrochlorothiazide, potassium chloride SA, and pioglitazone.  No orders of the defined types were placed in this encounter.    Follow-up: Return in about 3 months (around 07/10/2022).  Scarlette Calico, MD

## 2022-04-09 NOTE — Patient Instructions (Signed)
Health Maintenance, Male Adopting a healthy lifestyle and getting preventive care are important in promoting health and wellness. Ask your health care provider about: The right schedule for you to have regular tests and exams. Things you can do on your own to prevent diseases and keep yourself healthy. What should I know about diet, weight, and exercise? Eat a healthy diet  Eat a diet that includes plenty of vegetables, fruits, low-fat dairy products, and lean protein. Do not eat a lot of foods that are high in solid fats, added sugars, or sodium. Maintain a healthy weight Body mass index (BMI) is a measurement that can be used to identify possible weight problems. It estimates body fat based on height and weight. Your health care provider can help determine your BMI and help you achieve or maintain a healthy weight. Get regular exercise Get regular exercise. This is one of the most important things you can do for your health. Most adults should: Exercise for at least 150 minutes each week. The exercise should increase your heart rate and make you sweat (moderate-intensity exercise). Do strengthening exercises at least twice a week. This is in addition to the moderate-intensity exercise. Spend less time sitting. Even light physical activity can be beneficial. Watch cholesterol and blood lipids Have your blood tested for lipids and cholesterol at 65 years of age, then have this test every 5 years. You may need to have your cholesterol levels checked more often if: Your lipid or cholesterol levels are high. You are older than 65 years of age. You are at high risk for heart disease. What should I know about cancer screening? Many types of cancers can be detected early and may often be prevented. Depending on your health history and family history, you may need to have cancer screening at various ages. This may include screening for: Colorectal cancer. Prostate cancer. Skin cancer. Lung  cancer. What should I know about heart disease, diabetes, and high blood pressure? Blood pressure and heart disease High blood pressure causes heart disease and increases the risk of stroke. This is more likely to develop in people who have high blood pressure readings or are overweight. Talk with your health care provider about your target blood pressure readings. Have your blood pressure checked: Every 3-5 years if you are 18-39 years of age. Every year if you are 40 years old or older. If you are between the ages of 65 and 75 and are a current or former smoker, ask your health care provider if you should have a one-time screening for abdominal aortic aneurysm (AAA). Diabetes Have regular diabetes screenings. This checks your fasting blood sugar level. Have the screening done: Once every three years after age 45 if you are at a normal weight and have a low risk for diabetes. More often and at a younger age if you are overweight or have a high risk for diabetes. What should I know about preventing infection? Hepatitis B If you have a higher risk for hepatitis B, you should be screened for this virus. Talk with your health care provider to find out if you are at risk for hepatitis B infection. Hepatitis C Blood testing is recommended for: Everyone born from 1945 through 1965. Anyone with known risk factors for hepatitis C. Sexually transmitted infections (STIs) You should be screened each year for STIs, including gonorrhea and chlamydia, if: You are sexually active and are younger than 65 years of age. You are older than 65 years of age and your   health care provider tells you that you are at risk for this type of infection. Your sexual activity has changed since you were last screened, and you are at increased risk for chlamydia or gonorrhea. Ask your health care provider if you are at risk. Ask your health care provider about whether you are at high risk for HIV. Your health care provider  may recommend a prescription medicine to help prevent HIV infection. If you choose to take medicine to prevent HIV, you should first get tested for HIV. You should then be tested every 3 months for as long as you are taking the medicine. Follow these instructions at home: Alcohol use Do not drink alcohol if your health care provider tells you not to drink. If you drink alcohol: Limit how much you have to 0-2 drinks a day. Know how much alcohol is in your drink. In the [place] one drink equals one 12 oz bottle of beer (355 mL), one 5 oz glass of wine (148 mL), or one 1 oz glass of hard liquor (44 mL). Lifestyle Do not use any products that contain nicotine or tobacco. These products include cigarettes, chewing tobacco, and vaping devices, such as e-cigarettes. If you need help quitting, ask your health care provider. Do not use street drugs. Do not share needles. Ask your health care provider for help if you need support or information about quitting drugs. General instructions Schedule regular health, dental, and eye exams. Stay current with your vaccines. Tell your health care provider if: You often feel depressed. You have ever been abused or do not feel safe at home. Summary Adopting a healthy lifestyle and getting preventive care are important in promoting health and wellness. Follow your health care provider's instructions about healthy diet, exercising, and getting tested or screened for diseases. Follow your health care provider's instructions on monitoring your cholesterol and blood pressure. This information is not intended to replace advice given to you by your health care provider. Make sure you discuss any questions you have with your health care provider. Document Revised: 03/05/2021 Document Reviewed: 03/05/2021 Elsevier Patient Education  2023 [employer]  

## 2022-04-12 ENCOUNTER — Encounter: Payer: Self-pay | Admitting: Internal Medicine

## 2022-04-12 MED ORDER — SOLIQUA 100-33 UNT-MCG/ML ~~LOC~~ SOPN: 20.0000 [IU] | PEN_INJECTOR | Freq: Every day | SUBCUTANEOUS | 1 refills | Status: DC

## 2022-04-17 ENCOUNTER — Ambulatory Visit (INDEPENDENT_AMBULATORY_CARE_PROVIDER_SITE_OTHER): Payer: Managed Care, Other (non HMO)

## 2022-04-17 DIAGNOSIS — E538 Deficiency of other specified B group vitamins: Secondary | ICD-10-CM | POA: Diagnosis not present

## 2022-04-17 MED ORDER — CYANOCOBALAMIN 1000 MCG/ML IJ SOLN
1000.0000 ug | Freq: Once | INTRAMUSCULAR | Status: AC
  Administered 2022-04-17: 1000 ug via INTRAMUSCULAR

## 2022-04-17 NOTE — Progress Notes (Signed)
Pt here for monthly B12 injection per Dr Ronnald Ramp   B12 1059mg given IM left deltoid and pt tolerated injection well.   Pt to schedule next B12 injection upon check out.

## 2022-04-26 ENCOUNTER — Other Ambulatory Visit: Payer: Self-pay | Admitting: Internal Medicine

## 2022-04-26 DIAGNOSIS — E119 Type 2 diabetes mellitus without complications: Secondary | ICD-10-CM

## 2022-04-26 DIAGNOSIS — E118 Type 2 diabetes mellitus with unspecified complications: Secondary | ICD-10-CM

## 2022-05-11 ENCOUNTER — Other Ambulatory Visit: Payer: Self-pay | Admitting: Internal Medicine

## 2022-05-11 DIAGNOSIS — I10 Essential (primary) hypertension: Secondary | ICD-10-CM

## 2022-05-11 DIAGNOSIS — I2510 Atherosclerotic heart disease of native coronary artery without angina pectoris: Secondary | ICD-10-CM

## 2022-05-18 ENCOUNTER — Other Ambulatory Visit: Payer: Self-pay | Admitting: Internal Medicine

## 2022-05-18 DIAGNOSIS — E876 Hypokalemia: Secondary | ICD-10-CM

## 2022-05-18 DIAGNOSIS — E119 Type 2 diabetes mellitus without complications: Secondary | ICD-10-CM

## 2022-05-18 DIAGNOSIS — I10 Essential (primary) hypertension: Secondary | ICD-10-CM

## 2022-05-18 DIAGNOSIS — E559 Vitamin D deficiency, unspecified: Secondary | ICD-10-CM

## 2022-05-20 ENCOUNTER — Other Ambulatory Visit: Payer: Self-pay | Admitting: Internal Medicine

## 2022-05-20 DIAGNOSIS — E785 Hyperlipidemia, unspecified: Secondary | ICD-10-CM

## 2022-05-20 DIAGNOSIS — I2510 Atherosclerotic heart disease of native coronary artery without angina pectoris: Secondary | ICD-10-CM

## 2022-05-22 ENCOUNTER — Ambulatory Visit: Payer: Managed Care, Other (non HMO)

## 2022-05-22 DIAGNOSIS — E538 Deficiency of other specified B group vitamins: Secondary | ICD-10-CM | POA: Diagnosis not present

## 2022-05-22 MED ORDER — CYANOCOBALAMIN 1000 MCG/ML IJ SOLN
1000.0000 ug | Freq: Once | INTRAMUSCULAR | Status: AC
  Administered 2022-05-22: 1000 ug via INTRAMUSCULAR

## 2022-05-22 NOTE — Progress Notes (Signed)
After obtaining consent, and per orders of Dr. Ronnald Ramp, injection of B12 given by Max Sane. Patient tolerated injection well in left deltoid and instructed to report any adverse reaction to me immediately.

## 2022-05-27 ENCOUNTER — Other Ambulatory Visit: Payer: Self-pay | Admitting: Internal Medicine

## 2022-05-27 DIAGNOSIS — E876 Hypokalemia: Secondary | ICD-10-CM

## 2022-05-27 DIAGNOSIS — I10 Essential (primary) hypertension: Secondary | ICD-10-CM

## 2022-05-27 LAB — ALDOSTERONE + RENIN ACTIVITY W/ RATIO
ALDO / PRA Ratio: 1.9 Ratio (ref 0.9–28.9)
Aldosterone: 8 ng/dL
Renin Activity: 4.31 ng/mL/h (ref 0.25–5.82)

## 2022-05-29 ENCOUNTER — Ambulatory Visit: Payer: Managed Care, Other (non HMO)

## 2022-05-30 ENCOUNTER — Other Ambulatory Visit: Payer: Self-pay | Admitting: Internal Medicine

## 2022-05-30 DIAGNOSIS — E119 Type 2 diabetes mellitus without complications: Secondary | ICD-10-CM

## 2022-05-30 MED ORDER — FREESTYLE LIBRE 2 READER DEVI: 1.0000 | Freq: Every day | 1 refills | Status: DC

## 2022-05-30 MED ORDER — FREESTYLE LIBRE 2 SENSOR MISC: 1.0000 | Freq: Every day | 1 refills | Status: DC

## 2022-06-14 ENCOUNTER — Other Ambulatory Visit: Payer: Self-pay | Admitting: Internal Medicine

## 2022-06-14 DIAGNOSIS — E118 Type 2 diabetes mellitus with unspecified complications: Secondary | ICD-10-CM

## 2022-06-14 DIAGNOSIS — K7581 Nonalcoholic steatohepatitis (NASH): Secondary | ICD-10-CM

## 2022-07-03 ENCOUNTER — Ambulatory Visit: Payer: Managed Care, Other (non HMO)

## 2022-07-04 ENCOUNTER — Ambulatory Visit (INDEPENDENT_AMBULATORY_CARE_PROVIDER_SITE_OTHER): Payer: Managed Care, Other (non HMO) | Admitting: *Deleted

## 2022-07-04 DIAGNOSIS — E538 Deficiency of other specified B group vitamins: Secondary | ICD-10-CM

## 2022-07-04 MED ORDER — CYANOCOBALAMIN 1000 MCG/ML IJ SOLN
1000.0000 ug | Freq: Once | INTRAMUSCULAR | Status: AC
  Administered 2022-07-04: 1000 ug via INTRAMUSCULAR

## 2022-07-04 NOTE — Progress Notes (Signed)
Pls cosign for B12 inj../lmb  

## 2022-07-27 ENCOUNTER — Other Ambulatory Visit: Payer: Self-pay | Admitting: Internal Medicine

## 2022-07-27 DIAGNOSIS — I2510 Atherosclerotic heart disease of native coronary artery without angina pectoris: Secondary | ICD-10-CM

## 2022-07-27 DIAGNOSIS — I10 Essential (primary) hypertension: Secondary | ICD-10-CM

## 2022-08-13 ENCOUNTER — Other Ambulatory Visit: Payer: Self-pay | Admitting: Internal Medicine

## 2022-08-13 DIAGNOSIS — I10 Essential (primary) hypertension: Secondary | ICD-10-CM

## 2022-08-13 DIAGNOSIS — E876 Hypokalemia: Secondary | ICD-10-CM

## 2022-08-13 DIAGNOSIS — T502X5A Adverse effect of carbonic-anhydrase inhibitors, benzothiadiazides and other diuretics, initial encounter: Secondary | ICD-10-CM

## 2022-08-14 ENCOUNTER — Ambulatory Visit: Payer: Managed Care, Other (non HMO)

## 2022-08-14 ENCOUNTER — Ambulatory Visit (INDEPENDENT_AMBULATORY_CARE_PROVIDER_SITE_OTHER): Payer: Medicare Other | Admitting: Internal Medicine

## 2022-08-14 ENCOUNTER — Encounter: Payer: Self-pay | Admitting: Internal Medicine

## 2022-08-14 VITALS — BP 160/92 | HR 89 | Temp 97.6°F | Ht 70.0 in | Wt 236.2 lb

## 2022-08-14 DIAGNOSIS — E119 Type 2 diabetes mellitus without complications: Secondary | ICD-10-CM | POA: Diagnosis not present

## 2022-08-14 DIAGNOSIS — E118 Type 2 diabetes mellitus with unspecified complications: Secondary | ICD-10-CM

## 2022-08-14 DIAGNOSIS — E538 Deficiency of other specified B group vitamins: Secondary | ICD-10-CM | POA: Diagnosis not present

## 2022-08-14 DIAGNOSIS — I10 Essential (primary) hypertension: Secondary | ICD-10-CM | POA: Diagnosis not present

## 2022-08-14 DIAGNOSIS — Z794 Long term (current) use of insulin: Secondary | ICD-10-CM

## 2022-08-14 DIAGNOSIS — D693 Immune thrombocytopenic purpura: Secondary | ICD-10-CM

## 2022-08-14 DIAGNOSIS — K7581 Nonalcoholic steatohepatitis (NASH): Secondary | ICD-10-CM | POA: Diagnosis not present

## 2022-08-14 LAB — CBC WITH DIFFERENTIAL/PLATELET
Basophils Absolute: 0 10*3/uL (ref 0.0–0.1)
Basophils Relative: 1.3 % (ref 0.0–3.0)
Eosinophils Absolute: 0.1 10*3/uL (ref 0.0–0.7)
Eosinophils Relative: 1.8 % (ref 0.0–5.0)
HCT: 45.4 % (ref 39.0–52.0)
Hemoglobin: 15.7 g/dL (ref 13.0–17.0)
Lymphocytes Relative: 25.8 % (ref 12.0–46.0)
Lymphs Abs: 0.9 10*3/uL (ref 0.7–4.0)
MCHC: 34.7 g/dL (ref 30.0–36.0)
MCV: 93 fl (ref 78.0–100.0)
Monocytes Absolute: 0.4 10*3/uL (ref 0.1–1.0)
Monocytes Relative: 10.7 % (ref 3.0–12.0)
Neutro Abs: 2.2 10*3/uL (ref 1.4–7.7)
Neutrophils Relative %: 60.4 % (ref 43.0–77.0)
Platelets: 121 10*3/uL — ABNORMAL LOW (ref 150.0–400.0)
RBC: 4.88 Mil/uL (ref 4.22–5.81)
RDW: 12.7 % (ref 11.5–15.5)
WBC: 3.6 10*3/uL — ABNORMAL LOW (ref 4.0–10.5)

## 2022-08-14 LAB — BASIC METABOLIC PANEL
BUN: 20 mg/dL (ref 6–23)
CO2: 25 mEq/L (ref 19–32)
Calcium: 9.9 mg/dL (ref 8.4–10.5)
Chloride: 99 mEq/L (ref 96–112)
Creatinine, Ser: 0.83 mg/dL (ref 0.40–1.50)
GFR: 92.02 mL/min (ref >60.00)
Glucose, Bld: 191 mg/dL — ABNORMAL HIGH (ref 70–99)
Potassium: 3.9 mEq/L (ref 3.5–5.1)
Sodium: 134 mEq/L — ABNORMAL LOW (ref 135–145)

## 2022-08-14 LAB — FOLATE: Folate: 18 ng/mL (ref >5.9)

## 2022-08-14 LAB — HEPATIC FUNCTION PANEL
ALT: 63 U/L — ABNORMAL HIGH (ref 0–53)
AST: 32 U/L (ref 0–37)
Albumin: 4.6 g/dL (ref 3.5–5.2)
Alkaline Phosphatase: 50 U/L (ref 39–117)
Bilirubin, Direct: 0.2 mg/dL (ref 0.0–0.3)
Total Bilirubin: 0.9 mg/dL (ref 0.2–1.2)
Total Protein: 7.4 g/dL (ref 6.0–8.3)

## 2022-08-14 LAB — TSH: TSH: 4.41 u[IU]/mL (ref 0.35–5.50)

## 2022-08-14 LAB — HEMOGLOBIN A1C: Hgb A1c MFr Bld: 7.6 % — ABNORMAL HIGH (ref 4.6–6.5)

## 2022-08-14 MED ORDER — CYANOCOBALAMIN 1000 MCG/ML IJ SOLN
1000.0000 ug | Freq: Once | INTRAMUSCULAR | Status: AC
  Administered 2022-08-14: 1000 ug via INTRAMUSCULAR

## 2022-08-14 NOTE — Patient Instructions (Signed)
Hypertension, Adult High blood pressure (hypertension) is when the force of blood pumping through the arteries is too strong. The arteries are the blood vessels that carry blood from the heart throughout the body. Hypertension forces the heart to work harder to pump blood and may cause arteries to become narrow or stiff. Untreated or uncontrolled hypertension can lead to a heart attack, heart failure, a stroke, kidney disease, and other problems. A blood pressure reading consists of a higher number over a lower number. Ideally, your blood pressure should be below 120/80. The first ("top") number is called the systolic pressure. It is a measure of the pressure in your arteries as your heart beats. The second ("bottom") number is called the diastolic pressure. It is a measure of the pressure in your arteries as the heart relaxes. What are the causes? The exact cause of this condition is not known. There are some conditions that result in high blood pressure. What increases the risk? Certain factors may make you more likely to develop high blood pressure. Some of these risk factors are under your control, including: Smoking. Not getting enough exercise or physical activity. Being overweight. Having too much fat, sugar, calories, or salt (sodium) in your diet. Drinking too much alcohol. Other risk factors include: Having a personal history of heart disease, diabetes, high cholesterol, or kidney disease. Stress. Having a family history of high blood pressure and high cholesterol. Having obstructive sleep apnea. Age. The risk increases with age. What are the signs or symptoms? High blood pressure may not cause symptoms. Very high blood pressure (hypertensive crisis) may cause: Headache. Fast or irregular heartbeats (palpitations). Shortness of breath. Nosebleed. Nausea and vomiting. Vision changes. Severe chest pain, dizziness, and seizures. How is this diagnosed? This condition is diagnosed by  measuring your blood pressure while you are seated, with your arm resting on a flat surface, your legs uncrossed, and your feet flat on the floor. The cuff of the blood pressure monitor will be placed directly against the skin of your upper arm at the level of your heart. Blood pressure should be measured at least twice using the same arm. Certain conditions can cause a difference in blood pressure between your right and left arms. If you have a high blood pressure reading during one visit or you have normal blood pressure with other risk factors, you may be asked to: Return on a different day to have your blood pressure checked again. Monitor your blood pressure at home for 1 week or longer. If you are diagnosed with hypertension, you may have other blood or imaging tests to help your health care provider understand your overall risk for other conditions. How is this treated? This condition is treated by making healthy lifestyle changes, such as eating healthy foods, exercising more, and reducing your alcohol intake. You may be referred for counseling on a healthy diet and physical activity. Your health care provider may prescribe medicine if lifestyle changes are not enough to get your blood pressure under control and if: Your systolic blood pressure is above 130. Your diastolic blood pressure is above 80. Your personal target blood pressure may vary depending on your medical conditions, your age, and other factors. Follow these instructions at home: Eating and drinking  Eat a diet that is high in fiber and potassium, and low in sodium, added sugar, and fat. An example of this eating plan is called the DASH diet. DASH stands for Dietary Approaches to Stop Hypertension. To eat this way: Eat   plenty of fresh fruits and vegetables. Try to fill one half of your plate at each meal with fruits and vegetables. Eat whole grains, such as whole-wheat pasta, brown rice, or whole-grain bread. Fill about one  fourth of your plate with whole grains. Eat or drink low-fat dairy products, such as skim milk or low-fat yogurt. Avoid fatty cuts of meat, processed or cured meats, and poultry with skin. Fill about one fourth of your plate with lean proteins, such as fish, chicken without skin, beans, eggs, or tofu. Avoid pre-made and processed foods. These tend to be higher in sodium, added sugar, and fat. Reduce your daily sodium intake. Many people with hypertension should eat less than 1,500 mg of sodium a day. Do not drink alcohol if: Your health care provider tells you not to drink. You are pregnant, may be pregnant, or are planning to become pregnant. If you drink alcohol: Limit how much you have to: 0-1 drink a day for women. 0-2 drinks a day for men. Know how much alcohol is in your drink. In the [place] one drink equals one 12 oz bottle of beer (355 mL), one 5 oz glass of wine (148 mL), or one 1 oz glass of hard liquor (44 mL). Lifestyle  Work with your health care provider to maintain a healthy body weight or to lose weight. Ask what an ideal weight is for you. Get at least 30 minutes of exercise that causes your heart to beat faster (aerobic exercise) most days of the week. Activities may include walking, swimming, or biking. Include exercise to strengthen your muscles (resistance exercise), such as Pilates or lifting weights, as part of your weekly exercise routine. Try to do these types of exercises for 30 minutes at least 3 days a week. Do not use any products that contain nicotine or tobacco. These products include cigarettes, chewing tobacco, and vaping devices, such as e-cigarettes. If you need help quitting, ask your health care provider. Monitor your blood pressure at home as told by your health care provider. Keep all follow-up visits. This is important. Medicines Take over-the-counter and prescription medicines only as told by your health care provider. Follow directions carefully. Blood  pressure medicines must be taken as prescribed. Do not skip doses of blood pressure medicine. Doing this puts you at risk for problems and can make the medicine less effective. Ask your health care provider about side effects or reactions to medicines that you should watch for. Contact a health care provider if you: Think you are having a reaction to a medicine you are taking. Have headaches that keep coming back (recurring). Feel dizzy. Have swelling in your ankles. Have trouble with your vision. Get help right away if you: Develop a severe headache or confusion. Have unusual weakness or numbness. Feel faint. Have severe pain in your chest or abdomen. Vomit repeatedly. Have trouble breathing. These symptoms may be an emergency. Get help right away. Call 911. Do not wait to see if the symptoms will go away. Do not drive yourself to the hospital. Summary Hypertension is when the force of blood pumping through your arteries is too strong. If this condition is not controlled, it may put you at risk for serious complications. Your personal target blood pressure may vary depending on your medical conditions, your age, and other factors. For most people, a normal blood pressure is less than 120/80. Hypertension is treated with lifestyle changes, medicines, or a combination of both. Lifestyle changes include losing weight, eating a healthy,   low-sodium diet, exercising more, and limiting alcohol. This information is not intended to replace advice given to you by your health care provider. Make sure you discuss any questions you have with your health care provider. Document Revised: 08/21/2021 Document Reviewed: 08/21/2021 Elsevier Patient Education  2023 [employer] Inc.  

## 2022-08-14 NOTE — Progress Notes (Unsigned)
Subjective:  Patient ID: [name], male    DOB: 07/15/1956  Age: 66 y.o. MRN: [id number]  CC: Hypertension and Diabetes   HPI DELBERT DARLEY presents for f/up -  He was not willing to get vaccinated against influenza, pneumonia, or shingles today.  He is active and denies chest pain, shortness of breath, diaphoresis, or edema.  Outpatient Medications Prior to Visit  Medication Sig Dispense Refill   BD PEN NEEDLE MICRO U/F 32G X 6 MM MISC USE DAILY AS DIRECTED 100 each 1   celecoxib (CELEBREX) 200 MG capsule Take 200 mg by mouth daily as needed.     Cholecalciferol (VITAMIN D3) 50 MCG (2000 UT) TABS TAKE 1 TABLET BY MOUTH DAILY 90 tablet 1   Continuous Blood Gluc Receiver (FREESTYLE LIBRE 2 READER) DEVI 1 Act by Does not apply route daily. 6 each 1   Continuous Blood Gluc Sensor (FREESTYLE LIBRE 2 SENSOR) MISC 1 Act by Does not apply route daily. 6 each 1   esomeprazole (NEXIUM) 40 MG capsule TAKE 1 CAPSULE BY MOUTH EVERY MORNING BEFORE BREAKFAST 30 capsule 5   fexofenadine (ALLEGRA) 180 MG tablet Take 180 mg by mouth daily.     irbesartan (AVAPRO) 300 MG tablet TAKE 1 TABLET(300 MG) BY MOUTH DAILY 90 tablet 0   metFORMIN (GLUCOPHAGE-XR) 750 MG 24 hr tablet TAKE 2 TABLETS(1500 MG) BY MOUTH DAILY WITH BREAKFAST 180 tablet 1   potassium chloride SA (KLOR-CON M) 20 MEQ tablet TAKE 1 TABLET BY MOUTH DAILY 90 tablet 0   psyllium (REGULOID) 0.52 g capsule Take 0.52 g by mouth daily.     rosuvastatin (CRESTOR) 10 MG tablet TAKE 1 TABLET(10 MG) BY MOUTH DAILY 90 tablet 1   triamterene-hydrochlorothiazide (DYAZIDE) 37.5-25 MG capsule TAKE 1 CAPSULE BY MOUTH DAILY 90 capsule 0   Insulin Glargine-Lixisenatide (SOLIQUA) 100-33 UNT-MCG/ML SOPN Inject 20 Units into the skin daily. 54 mL 1   No facility-administered medications prior to visit.    ROS Review of Systems  Constitutional:  Positive for unexpected weight change (wt gain). Negative for chills, diaphoresis and fatigue.   HENT: Negative.    Eyes: Negative.   Respiratory: Negative.  Negative for cough, choking, shortness of breath and wheezing.   Cardiovascular:  Negative for chest pain, palpitations and leg swelling.  Gastrointestinal:  Negative for abdominal pain, constipation, diarrhea, nausea and vomiting.  Endocrine: Negative.   Genitourinary: Negative.  Negative for difficulty urinating.  Musculoskeletal: Negative.   Skin: Negative.   Neurological:  Negative for dizziness, weakness and headaches.  Hematological:  Negative for adenopathy. Does not bruise/bleed easily.  Psychiatric/Behavioral: Negative.      Objective:  BP (!) 160/92   Pulse 89   Temp 97.6 F (36.4 C) (Oral)   Ht 5' 10"  (1.778 m)   Wt 236 lb 4 oz (107.2 kg)   SpO2 93%   BMI 33.90 kg/m   BP Readings from Last 3 Encounters:  08/14/22 (!) 160/92  04/09/22 (!) 160/82  01/22/22 140/76    Wt Readings from Last 3 Encounters:  08/14/22 236 lb 4 oz (107.2 kg)  04/09/22 233 lb (105.7 kg)  04/01/22 234 lb (106.1 kg)    Physical Exam Vitals reviewed.  HENT:     Nose: Nose normal.     Mouth/Throat:     Mouth: Mucous membranes are moist.  Eyes:     General: No scleral icterus.    Conjunctiva/sclera: Conjunctivae normal.  Cardiovascular:     Rate and Rhythm:  Normal rate and regular rhythm.     Heart sounds: No murmur heard. Pulmonary:     Effort: Pulmonary effort is normal.     Breath sounds: No stridor. No wheezing, rhonchi or rales.  Abdominal:     General: Abdomen is flat.     Palpations: There is no mass.     Tenderness: There is no abdominal tenderness. There is no guarding.     Hernia: No hernia is present.  Musculoskeletal:        General: Normal range of motion.     Cervical back: Neck supple.     Right lower leg: No edema.     Left lower leg: No edema.  Lymphadenopathy:     Cervical: No cervical adenopathy.  Skin:    General: Skin is warm and dry.  Neurological:     General: No focal deficit present.      Mental Status: He is alert. Mental status is at baseline.  Psychiatric:        Mood and Affect: Mood normal.        Behavior: Behavior normal.     Lab Results  Component Value Date   WBC 3.6 (L) 08/14/2022   HGB 15.7 08/14/2022   HCT 45.4 08/14/2022   PLT 121.0 (L) 08/14/2022   GLUCOSE 191 (H) 08/14/2022   CHOL 121 04/09/2022   TRIG 98.0 04/09/2022   HDL 40.30 04/09/2022   LDLDIRECT 146.5 12/05/2010   LDLCALC 61 04/09/2022   ALT 63 (H) 08/14/2022   AST 32 08/14/2022   NA 134 (L) 08/14/2022   K 3.9 08/14/2022   CL 99 08/14/2022   CREATININE 0.83 08/14/2022   BUN 20 08/14/2022   CO2 25 08/14/2022   TSH 4.41 08/14/2022   PSA 0.91 04/09/2022   INR 1.0 11/14/2021   HGBA1C 7.6 (H) 08/14/2022   MICROALBUR <0.7 01/22/2022    CT Chest Wo Contrast  Result Date: 07/28/2019 CLINICAL DATA:  Lung nodule, less than 1 cm. EXAM: CT CHEST WITHOUT CONTRAST TECHNIQUE: Multidetector CT imaging of the chest was performed following the standard protocol without IV contrast. COMPARISON:  CT chest July 10, 2018 FINDINGS: Cardiovascular: Scattered atherosclerosis and signs of coronary artery disease. Extensive coronary artery calcifications including left main and 3 vessels along with right coronary circulation. No pericardial effusion. Central pulmonary arteries are normal caliber. Vessels not well evaluated given lack of contrast. Mediastinum/Nodes: No signs of mediastinal lymphadenopathy. No axillary or hilar lymphadenopathy.  No thoracic inlet adenopathy. Lungs/Pleura: No signs effusion or consolidation. Scattered calcified nodules. No new pulmonary nodules. 4 mm right lower lobe pulmonary nodule unchanged (image 87, series 3) 5 x 5 mm nodule in the left costodiaphragmatic sulcus, stable when measured by this observer on the previous exam. Triangular perifissural opacity right chest along the minor fissure likely small subpleural lymph node slightly larger than on the prior study. Subtle  ground-glass without focal characteristics present in the superior segment of the right lower lobe. Airways are patent. Upper Abdomen: Lobular liver contour with signs of fissural retraction/widening. Visualized spleen is likely mildly enlarged between 12 and 13 cm. Musculoskeletal: No signs of acute bone finding or destructive bone process. IMPRESSION: 1. Small approximately 5 mm pulmonary nodules are demonstrated in the lung bases as described. No change with direct measurement comparison. 2. Atherosclerosis and coronary artery disease. 3. Lobular hepatic contour raising the question of liver disease. Clinical and laboratory correlation may be helpful spleen is also at the upper limits of normal to slightly  enlarged at approximately 12-13 cm. Aortic Atherosclerosis (ICD10-I70.0). Electronically Signed   By: Zetta Bills M.D.   On: 07/28/2019 10:10    Assessment & Plan:   Kashawn was seen today for hypertension and diabetes.  Diagnoses and all orders for this visit:  Essential hypertension- His blood pressure is not adequately well controlled.  I encouraged him to improve his lifestyle modifications. -     Basic metabolic panel; Future -     CBC with Differential/Platelet; Future -     TSH; Future -     TSH -     CBC with Differential/Platelet -     Basic metabolic panel  Nonalcoholic steatohepatitis ([facility]) - I encouraged him to improve his lifestyle modifications. -     Hepatic function panel; Future -     Hepatic function panel  Type II diabetes mellitus with manifestations ([place]) -     Basic metabolic panel; Future -     Hemoglobin A1c; Future -     HM Diabetes Foot Exam -     Hemoglobin A1c -     Basic metabolic panel  Chronic ITP (idiopathic thrombocytopenia) (H[facility])- His platelet count is stable.  No complications like bleeding or bruising. -     CBC with Differential/Platelet; Future -     Folate; Future -     Folate -     CBC with Differential/Platelet  B12 deficiency -      Folate; Future -     cyanocobalamin (VITAMIN B12) injection 1,000 mcg -     Folate  Insulin-requiring or dependent type II diabetes mellitus ([place])- His A1c is up to 7.6%.  Will increase the dose of Soliqua. -     Insulin Glargine-Lixisenatide (SOLIQUA) 100-33 UNT-MCG/ML SOPN; Inject 30 Units into the skin daily.   I have changed [employer]. [employer]. I am also having him maintain his fexofenadine, esomeprazole, celecoxib, psyllium, metFORMIN, Vitamin D3, BD Pen Needle Micro U/F, rosuvastatin, FreeStyle Libre 2 Reader, [employer] 2 Sensor, irbesartan, triamterene-hydrochlorothiazide, and potassium chloride SA. We administered cyanocobalamin.  Meds ordered this encounter  Medications   cyanocobalamin (VITAMIN B12) injection 1,000 mcg   Insulin Glargine-Lixisenatide (SOLIQUA) 100-33 UNT-MCG/ML SOPN    Sig: Inject 30 Units into the skin daily.    Dispense:  27 mL    Refill:  1     Follow-up: Return in about 4 months (around 12/15/2022).  Scarlette Calico, MD

## 2022-08-16 MED ORDER — SOLIQUA 100-33 UNT-MCG/ML ~~LOC~~ SOPN: 30.0000 [IU] | PEN_INJECTOR | Freq: Every day | SUBCUTANEOUS | 1 refills | Status: DC

## 2022-09-15 ENCOUNTER — Other Ambulatory Visit: Payer: Self-pay | Admitting: Internal Medicine

## 2022-09-15 DIAGNOSIS — I10 Essential (primary) hypertension: Secondary | ICD-10-CM

## 2022-09-15 DIAGNOSIS — E876 Hypokalemia: Secondary | ICD-10-CM

## 2022-09-15 DIAGNOSIS — E559 Vitamin D deficiency, unspecified: Secondary | ICD-10-CM

## 2022-09-25 ENCOUNTER — Ambulatory Visit (INDEPENDENT_AMBULATORY_CARE_PROVIDER_SITE_OTHER): Payer: Medicare Other

## 2022-09-25 DIAGNOSIS — E538 Deficiency of other specified B group vitamins: Secondary | ICD-10-CM | POA: Diagnosis not present

## 2022-09-25 MED ORDER — CYANOCOBALAMIN 1000 MCG/ML IJ SOLN
1000.0000 ug | Freq: Once | INTRAMUSCULAR | Status: AC
  Administered 2022-09-25: 1000 ug via INTRAMUSCULAR

## 2022-09-25 NOTE — Progress Notes (Signed)
After obtaining consent, and per orders of Dr. Ronnald Ramp, injection of B12 was given in the left deltoid by Marrian Salvage. Patient instructed to report any adverse reaction to me immediately.

## 2022-10-15 ENCOUNTER — Other Ambulatory Visit: Payer: Self-pay | Admitting: Internal Medicine

## 2022-10-15 DIAGNOSIS — E119 Type 2 diabetes mellitus without complications: Secondary | ICD-10-CM

## 2022-10-15 DIAGNOSIS — E118 Type 2 diabetes mellitus with unspecified complications: Secondary | ICD-10-CM

## 2022-10-30 ENCOUNTER — Ambulatory Visit (INDEPENDENT_AMBULATORY_CARE_PROVIDER_SITE_OTHER): Payer: Medicare Other | Admitting: *Deleted

## 2022-10-30 DIAGNOSIS — E538 Deficiency of other specified B group vitamins: Secondary | ICD-10-CM | POA: Diagnosis not present

## 2022-10-30 MED ORDER — CYANOCOBALAMIN 1000 MCG/ML IJ SOLN
1000.0000 ug | Freq: Once | INTRAMUSCULAR | Status: AC
  Administered 2022-10-30: 1000 ug via INTRAMUSCULAR

## 2022-10-30 NOTE — Progress Notes (Signed)
Pls cosign for B12 inj../lmb  

## 2022-11-04 ENCOUNTER — Other Ambulatory Visit: Payer: Self-pay | Admitting: Internal Medicine

## 2022-11-04 DIAGNOSIS — I2510 Atherosclerotic heart disease of native coronary artery without angina pectoris: Secondary | ICD-10-CM

## 2022-11-04 DIAGNOSIS — I10 Essential (primary) hypertension: Secondary | ICD-10-CM

## 2022-11-14 ENCOUNTER — Other Ambulatory Visit: Payer: Self-pay | Admitting: Internal Medicine

## 2022-11-14 DIAGNOSIS — E785 Hyperlipidemia, unspecified: Secondary | ICD-10-CM

## 2022-11-14 DIAGNOSIS — I2510 Atherosclerotic heart disease of native coronary artery without angina pectoris: Secondary | ICD-10-CM

## 2022-11-19 ENCOUNTER — Other Ambulatory Visit: Payer: Self-pay | Admitting: Internal Medicine

## 2022-11-19 DIAGNOSIS — E119 Type 2 diabetes mellitus without complications: Secondary | ICD-10-CM

## 2022-11-27 ENCOUNTER — Telehealth: Payer: Self-pay | Admitting: Internal Medicine

## 2022-11-27 ENCOUNTER — Other Ambulatory Visit: Payer: Self-pay | Admitting: Internal Medicine

## 2022-11-27 DIAGNOSIS — E119 Type 2 diabetes mellitus without complications: Secondary | ICD-10-CM

## 2022-11-27 MED ORDER — SOLIQUA 100-33 UNT-MCG/ML ~~LOC~~ SOPN: 30.0000 [IU] | PEN_INJECTOR | Freq: Every day | SUBCUTANEOUS | 0 refills | Status: DC

## 2022-11-27 NOTE — Telephone Encounter (Signed)
Patient called and stated that the pharmacy is saying that his prescription for Insulin Glargine-Lixisenatide (SOLIQUA) 100-33 UNT-MCG/ML SOPN  is showing on their end that the patient should inject 20 units instead of the 30 units that it says on our end. The pharmacy is requesting that a new prescription with the correct amount of units that the patient is suppose to take. Patient's pharmacy is [id number] - [place], [address] AT [address]   Best callback number for patient [phone].

## 2022-12-02 ENCOUNTER — Ambulatory Visit: Payer: Medicare Other

## 2022-12-04 ENCOUNTER — Ambulatory Visit (INDEPENDENT_AMBULATORY_CARE_PROVIDER_SITE_OTHER): Payer: Medicare Other | Admitting: *Deleted

## 2022-12-04 DIAGNOSIS — E538 Deficiency of other specified B group vitamins: Secondary | ICD-10-CM | POA: Diagnosis not present

## 2022-12-04 MED ORDER — CYANOCOBALAMIN 1000 MCG/ML IJ SOLN
1000.0000 ug | Freq: Once | INTRAMUSCULAR | Status: AC
  Administered 2022-12-04: 1000 ug via INTRAMUSCULAR

## 2022-12-04 NOTE — Progress Notes (Signed)
Pls cosign for B12 inj../lmb  

## 2022-12-10 ENCOUNTER — Other Ambulatory Visit: Payer: Self-pay | Admitting: Internal Medicine

## 2022-12-10 DIAGNOSIS — I2510 Atherosclerotic heart disease of native coronary artery without angina pectoris: Secondary | ICD-10-CM

## 2022-12-10 DIAGNOSIS — I10 Essential (primary) hypertension: Secondary | ICD-10-CM

## 2022-12-14 ENCOUNTER — Other Ambulatory Visit: Payer: Self-pay | Admitting: Internal Medicine

## 2022-12-14 DIAGNOSIS — E119 Type 2 diabetes mellitus without complications: Secondary | ICD-10-CM

## 2023-01-09 ENCOUNTER — Ambulatory Visit (INDEPENDENT_AMBULATORY_CARE_PROVIDER_SITE_OTHER): Payer: Medicare Other | Admitting: Internal Medicine

## 2023-01-09 ENCOUNTER — Encounter: Payer: Self-pay | Admitting: Internal Medicine

## 2023-01-09 VITALS — BP 148/88 | HR 86 | Temp 98.2°F | Resp 16 | Ht 70.0 in | Wt 238.0 lb

## 2023-01-09 DIAGNOSIS — E538 Deficiency of other specified B group vitamins: Secondary | ICD-10-CM | POA: Diagnosis not present

## 2023-01-09 DIAGNOSIS — Z794 Long term (current) use of insulin: Secondary | ICD-10-CM | POA: Diagnosis not present

## 2023-01-09 DIAGNOSIS — K7581 Nonalcoholic steatohepatitis (NASH): Secondary | ICD-10-CM | POA: Diagnosis not present

## 2023-01-09 DIAGNOSIS — I10 Essential (primary) hypertension: Secondary | ICD-10-CM

## 2023-01-09 DIAGNOSIS — D693 Immune thrombocytopenic purpura: Secondary | ICD-10-CM

## 2023-01-09 DIAGNOSIS — E119 Type 2 diabetes mellitus without complications: Secondary | ICD-10-CM | POA: Diagnosis not present

## 2023-01-09 DIAGNOSIS — E118 Type 2 diabetes mellitus with unspecified complications: Secondary | ICD-10-CM | POA: Diagnosis not present

## 2023-01-09 DIAGNOSIS — Z23 Encounter for immunization: Secondary | ICD-10-CM

## 2023-01-09 LAB — CBC WITH DIFFERENTIAL/PLATELET
Basophils Absolute: 0 10*3/uL (ref 0.0–0.1)
Basophils Relative: 1.2 % (ref 0.0–3.0)
Eosinophils Absolute: 0.1 10*3/uL (ref 0.0–0.7)
Eosinophils Relative: 3.2 % (ref 0.0–5.0)
HCT: 44.8 % (ref 39.0–52.0)
Hemoglobin: 15.8 g/dL (ref 13.0–17.0)
Lymphocytes Relative: 32.6 % (ref 12.0–46.0)
Lymphs Abs: 1 10*3/uL (ref 0.7–4.0)
MCHC: 35.3 g/dL (ref 30.0–36.0)
MCV: 92.7 fl (ref 78.0–100.0)
Monocytes Absolute: 0.4 10*3/uL (ref 0.1–1.0)
Monocytes Relative: 12.5 % — ABNORMAL HIGH (ref 3.0–12.0)
Neutro Abs: 1.6 10*3/uL (ref 1.4–7.7)
Neutrophils Relative %: 50.5 % (ref 43.0–77.0)
Platelets: 112 10*3/uL — ABNORMAL LOW (ref 150.0–400.0)
RBC: 4.83 Mil/uL (ref 4.22–5.81)
RDW: 12.5 % (ref 11.5–15.5)
WBC: 3.1 10*3/uL — ABNORMAL LOW (ref 4.0–10.5)

## 2023-01-09 LAB — MICROALBUMIN / CREATININE URINE RATIO
Creatinine,U: 53.6 mg/dL
Microalb Creat Ratio: 1.5 mg/g (ref 0.0–30.0)
Microalb, Ur: 0.8 mg/dL (ref 0.0–1.9)

## 2023-01-09 LAB — BASIC METABOLIC PANEL
BUN: 19 mg/dL (ref 6–23)
CO2: 28 mEq/L (ref 19–32)
Calcium: 9.5 mg/dL (ref 8.4–10.5)
Chloride: 98 mEq/L (ref 96–112)
Creatinine, Ser: 0.83 mg/dL (ref 0.40–1.50)
GFR: 91.76 mL/min (ref >60.00)
Glucose, Bld: 203 mg/dL — ABNORMAL HIGH (ref 70–99)
Potassium: 3.9 mEq/L (ref 3.5–5.1)
Sodium: 135 mEq/L (ref 135–145)

## 2023-01-09 LAB — URINALYSIS, ROUTINE W REFLEX MICROSCOPIC
Bilirubin Urine: NEGATIVE
Hgb urine dipstick: NEGATIVE
Ketones, ur: NEGATIVE
Leukocytes,Ua: NEGATIVE
Nitrite: NEGATIVE
RBC / HPF: NONE SEEN (ref 0–?)
Specific Gravity, Urine: 1.015 (ref 1.000–1.030)
Total Protein, Urine: NEGATIVE
Urine Glucose: 100 — AB
Urobilinogen, UA: 0.2 (ref 0.0–1.0)
pH: 6.5 (ref 5.0–8.0)

## 2023-01-09 LAB — HEPATIC FUNCTION PANEL
ALT: 65 U/L — ABNORMAL HIGH (ref 0–53)
AST: 35 U/L (ref 0–37)
Albumin: 4.2 g/dL (ref 3.5–5.2)
Alkaline Phosphatase: 50 U/L (ref 39–117)
Bilirubin, Direct: 0.2 mg/dL (ref 0.0–0.3)
Total Bilirubin: 0.9 mg/dL (ref 0.2–1.2)
Total Protein: 7.2 g/dL (ref 6.0–8.3)

## 2023-01-09 LAB — HEMOGLOBIN A1C: Hgb A1c MFr Bld: 7.8 % — ABNORMAL HIGH (ref 4.6–6.5)

## 2023-01-09 MED ORDER — FREESTYLE LIBRE 3 SENSOR MISC: 1.0000 | Freq: Every day | 5 refills | Status: DC

## 2023-01-09 NOTE — Patient Instructions (Signed)

## 2023-01-09 NOTE — Progress Notes (Signed)
Subjective:  Patient ID: Alex [name], male    DOB: Mar 08, 1956  Age: 67 y.o. MRN: [id number]  CC: Hypertension and Diabetes   HPI RYNA SCARRY presents for f/up ---  He exercises on a treadmill.  He does about a mile a day and his endurance is good.  He denies DOE, chest pain, diaphoresis, or edema.  Outpatient Medications Prior to Visit  Medication Sig Dispense Refill   BD PEN NEEDLE MICRO U/F 32G X 6 MM MISC USE AS DIRECTED 100 each 1   celecoxib (CELEBREX) 200 MG capsule Take 200 mg by mouth daily as needed.     Cholecalciferol (VITAMIN D3) 50 MCG (2000 UT) TABS TAKE 1 TABLET BY MOUTH DAILY 90 tablet 1   Continuous Blood Gluc Receiver (FREESTYLE LIBRE 2 READER) DEVI 1 Act by Does not apply route daily. 6 each 1   esomeprazole (NEXIUM) 40 MG capsule TAKE 1 CAPSULE BY MOUTH EVERY MORNING BEFORE BREAKFAST 30 capsule 5   fexofenadine (ALLEGRA) 180 MG tablet Take 180 mg by mouth daily.     Insulin Glargine-Lixisenatide (SOLIQUA) 100-33 UNT-MCG/ML SOPN Inject 30 Units into the skin daily. 18 mL 0   irbesartan (AVAPRO) 300 MG tablet TAKE 1 TABLET(300 MG) BY MOUTH DAILY 90 tablet 0   metFORMIN (GLUCOPHAGE-XR) 750 MG 24 hr tablet TAKE 2 TABLETS(1500 MG) BY MOUTH DAILY WITH BREAKFAST 180 tablet 1   potassium chloride SA (KLOR-CON M) 20 MEQ tablet TAKE 1 TABLET BY MOUTH DAILY 90 tablet 0   psyllium (REGULOID) 0.52 g capsule Take 0.52 g by mouth daily.     rosuvastatin (CRESTOR) 10 MG tablet TAKE 1 TABLET(10 MG) BY MOUTH DAILY 90 tablet 1   triamterene-hydrochlorothiazide (DYAZIDE) 37.5-25 MG capsule TAKE 1 CAPSULE BY MOUTH DAILY 90 capsule 0   Continuous Blood Gluc Sensor (FREESTYLE LIBRE 2 SENSOR) MISC APPLY EVERY 14 DAYS AS DIRECTED 6 each 1   No facility-administered medications prior to visit.    ROS Review of Systems  Constitutional:  Positive for unexpected weight change (wt gain). Negative for diaphoresis and fatigue.  HENT: Negative.    Eyes: Negative.   Respiratory:   Negative for cough, chest tightness, shortness of breath and wheezing.   Cardiovascular:  Negative for chest pain, palpitations and leg swelling.  Gastrointestinal:  Negative for abdominal pain, diarrhea, nausea and vomiting.  Endocrine: Negative.   Genitourinary: Negative.  Negative for difficulty urinating.  Musculoskeletal: Negative.  Negative for arthralgias and joint swelling.  Skin: Negative.   Neurological: Negative.  Negative for dizziness, weakness and light-headedness.  Hematological:  Negative for adenopathy. Does not bruise/bleed easily.  Psychiatric/Behavioral: Negative.      Objective:  BP (!) 148/88 (BP Location: Right Arm, Patient Position: Sitting)   Pulse 86   Temp 98.2 F (36.8 C) (Oral)   Resp 16   Ht 5\' 10"  (1.778 m)   Wt 238 lb (108 kg)   SpO2 94%   BMI 34.15 kg/m   BP Readings from Last 3 Encounters:  01/09/23 (!) 148/88  08/14/22 (!) 160/92  04/09/22 (!) 160/82    Wt Readings from Last 3 Encounters:  01/09/23 238 lb (108 kg)  08/14/22 236 lb 4 oz (107.2 kg)  04/09/22 233 lb (105.7 kg)    Physical Exam Vitals reviewed.  Constitutional:      Appearance: He is not ill-appearing.  HENT:     Nose: Nose normal.     Mouth/Throat:     Mouth: Mucous membranes are moist.  Eyes:     General: No scleral icterus.    Conjunctiva/sclera: Conjunctivae normal.  Cardiovascular:     Rate and Rhythm: Normal rate and regular rhythm.     Heart sounds: No murmur heard. Pulmonary:     Effort: Pulmonary effort is normal.     Breath sounds: No stridor. No wheezing, rhonchi or rales.  Abdominal:     General: Abdomen is protuberant. Bowel sounds are normal. There is no distension.     Palpations: Abdomen is soft. There is no hepatomegaly, splenomegaly or mass.     Tenderness: There is no abdominal tenderness.  Musculoskeletal:        General: Normal range of motion.     Cervical back: Neck supple.     Right lower leg: No edema.     Left lower leg: No edema.   Lymphadenopathy:     Cervical: No cervical adenopathy.  Skin:    General: Skin is warm and dry.  Neurological:     General: No focal deficit present.     Mental Status: He is alert.  Psychiatric:        Mood and Affect: Mood normal.        Behavior: Behavior normal.     Lab Results  Component Value Date   WBC 3.1 (L) 01/09/2023   HGB 15.8 01/09/2023   HCT 44.8 01/09/2023   PLT 112.0 (L) 01/09/2023   GLUCOSE 203 (H) 01/09/2023   CHOL 121 04/09/2022   TRIG 98.0 04/09/2022   HDL 40.30 04/09/2022   LDLDIRECT 146.5 12/05/2010   LDLCALC 61 04/09/2022   ALT 65 (H) 01/09/2023   AST 35 01/09/2023   NA 135 01/09/2023   K 3.9 01/09/2023   CL 98 01/09/2023   CREATININE 0.83 01/09/2023   BUN 19 01/09/2023   CO2 28 01/09/2023   TSH 4.41 08/14/2022   PSA 0.91 04/09/2022   INR 1.0 11/14/2021   HGBA1C 7.8 (H) 01/09/2023   MICROALBUR 0.8 01/09/2023    CT Chest Wo Contrast  Result Date: 07/28/2019 CLINICAL DATA:  Lung nodule, less than 1 cm. EXAM: CT CHEST WITHOUT CONTRAST TECHNIQUE: Multidetector CT imaging of the chest was performed following the standard protocol without IV contrast. COMPARISON:  CT chest July 10, 2018 FINDINGS: Cardiovascular: Scattered atherosclerosis and signs of coronary artery disease. Extensive coronary artery calcifications including left main and 3 vessels along with right coronary circulation. No pericardial effusion. Central pulmonary arteries are normal caliber. Vessels not well evaluated given lack of contrast. Mediastinum/Nodes: No signs of mediastinal lymphadenopathy. No axillary or hilar lymphadenopathy.  No thoracic inlet adenopathy. Lungs/Pleura: No signs effusion or consolidation. Scattered calcified nodules. No new pulmonary nodules. 4 mm right lower lobe pulmonary nodule unchanged (image 87, series 3) 5 x 5 mm nodule in the left costodiaphragmatic sulcus, stable when measured by this observer on the previous exam. Triangular perifissural opacity  right chest along the minor fissure likely small subpleural lymph node slightly larger than on the prior study. Subtle ground-glass without focal characteristics present in the superior segment of the right lower lobe. Airways are patent. Upper Abdomen: Lobular liver contour with signs of fissural retraction/widening. Visualized spleen is likely mildly enlarged between 12 and 13 cm. Musculoskeletal: No signs of acute bone finding or destructive bone process. IMPRESSION: 1. Small approximately 5 mm pulmonary nodules are demonstrated in the lung bases as described. No change with direct measurement comparison. 2. Atherosclerosis and coronary artery disease. 3. Lobular hepatic contour raising the question of liver  disease. Clinical and laboratory correlation may be helpful spleen is also at the upper limits of normal to slightly enlarged at approximately 12-13 cm. Aortic Atherosclerosis (ICD10-I70.0). Electronically Signed   By: Zetta Bills M.D.   On: 07/28/2019 10:10    Assessment & Plan:  B12 deficiency -     CBC with Differential/Platelet; Future  Essential hypertension- He has not achieved his blood pressure goal.  He will continue working on his lifestyle modifications. -     Basic metabolic panel; Future -     Urinalysis, Routine w reflex microscopic; Future  Nonalcoholic steatohepatitis ([facility]) -     Hepatic function panel; Future  Type II diabetes mellitus with manifestations ([place])- His A1c is up to 7.8%.  He will continue working on his lifestyle modifications. -     Basic metabolic panel; Future -     Microalbumin / creatinine urine ratio; Future -     Hemoglobin A1c; Future -     Urinalysis, Routine w reflex microscopic; Future  Chronic ITP (idiopathic thrombocytopenia) (H[facility])- Platelets are stable.  There is no history of bleeding or bruising. -     CBC with Differential/Platelet; Future  Insulin-requiring or dependent type II diabetes mellitus ([place]) -     FreeStyle Libre 3 Sensor;  1 Act by Does not apply route daily. Place 1 sensor on the skin every 14 days. Use to check glucose continuously  Dispense: 2 each; Refill: 5  Need for prophylactic vaccination with combined diphtheria-tetanus-pertussis (DTP) vaccine -     Boostrix; Inject 0.5 mLs into the muscle once for 1 dose.  Dispense: 0.5 mL; Refill: 0     Follow-up: Return in about 4 months (around 05/11/2023).  Scarlette Calico, MD

## 2023-01-10 MED ORDER — BOOSTRIX 5-2.5-18.5 LF-MCG/0.5 IM SUSP: 0.5000 mL | Freq: Once | INTRAMUSCULAR | 0 refills | Status: AC

## 2023-01-26 ENCOUNTER — Other Ambulatory Visit: Payer: Self-pay | Admitting: Internal Medicine

## 2023-01-26 DIAGNOSIS — E119 Type 2 diabetes mellitus without complications: Secondary | ICD-10-CM

## 2023-02-12 ENCOUNTER — Ambulatory Visit: Payer: Medicare Other

## 2023-02-12 ENCOUNTER — Ambulatory Visit (INDEPENDENT_AMBULATORY_CARE_PROVIDER_SITE_OTHER): Payer: Medicare Other | Admitting: Family Medicine

## 2023-02-12 ENCOUNTER — Encounter: Payer: Self-pay | Admitting: Family Medicine

## 2023-02-12 VITALS — BP 148/88 | HR 75 | Temp 97.8°F | Ht 70.0 in | Wt 235.0 lb

## 2023-02-12 DIAGNOSIS — R051 Acute cough: Secondary | ICD-10-CM | POA: Diagnosis not present

## 2023-02-12 DIAGNOSIS — J040 Acute laryngitis: Secondary | ICD-10-CM

## 2023-02-12 DIAGNOSIS — E538 Deficiency of other specified B group vitamins: Secondary | ICD-10-CM | POA: Diagnosis not present

## 2023-02-12 DIAGNOSIS — J069 Acute upper respiratory infection, unspecified: Secondary | ICD-10-CM | POA: Diagnosis not present

## 2023-02-12 MED ORDER — CYANOCOBALAMIN 1000 MCG/ML IJ SOLN
1000.0000 ug | Freq: Once | INTRAMUSCULAR | Status: AC
  Administered 2023-02-12: 1000 ug via INTRAMUSCULAR

## 2023-02-12 MED ORDER — BENZONATATE 200 MG PO CAPS
200.0000 mg | ORAL_CAPSULE | Freq: Two times a day (BID) | ORAL | 0 refills | Status: DC | PRN
Start: 2023-02-12 — End: 2023-05-13

## 2023-02-12 NOTE — Patient Instructions (Signed)
Your symptoms appear to be related to a viral illness.   You can continue taking Delsym for cough suppression and add plain Mucinex (not DM) to help with congestion. You need to drink plenty of water for these medications to work well.   Switch your allergy medication to either Xyzal, Claritin or Zyrtec (the generic is fine).   You can use over the counter nasal sprays such as Flonase for nasal congestion and runny nose.   Salt water gargles and throat lozenges for hoarseness and sore throat.   I prescribed Tessalon for cough and you can take this as needed and in addition to the over the counter medications.   Let us know if you are getting worse or not improving in the next 2-3 days.

## 2023-02-12 NOTE — Progress Notes (Signed)
Subjective:     Patient ID: Alex [name], male    DOB: Feb 02, 1956, 67 y.o.   MRN: [id number]  Chief Complaint  Patient presents with   Sore Throat    Started Monday but has since gotten better, just is still hoarse with drainage. Going on a trip next Monday and wants to knock it out. Using Delsum with some relief   Cough    Started monday    HPI Patient is in today for a 3 day hx of dry cough, sore throat, hoarseness, and post nasal drainage   Taking Allegra, Tylenol and Delsym.   Denies fever, chills, dizziness, headache, sinus pain, ear pain, ST, chest pain, palpitations, shortness of breath, abdominal pain, N/V/D, urinary symptoms, LE edema.     Health Maintenance Due  Topic Date Due   Medicare Annual Wellness (AWV)  Never done   COVID-19 Vaccine (4 - 2023-24 season) 06/28/2022   DTaP/Tdap/Td (3 - Td or Tdap) 12/16/2022   OPHTHALMOLOGY EXAM  01/12/2023    Past Medical History:  Diagnosis Date   Achilles tendinitis    PMH of   Allergy    Arthritis    B12 deficiency    Barrett's esophagus    Diabetes mellitus without complication    Diverticulosis of colon (without mention of hemorrhage) 2010   Fatty liver 10-16-2011   [place]   GERD (gastroesophageal reflux disease)    Sullivan Lone syndrome    Hiatal hernia    Homocystinemia    Hyperlipidemia    Hypertension    Nonspecific elevation of levels of transaminase or lactic acid dehydrogenase (LDH)    PMH of   OSA on CPAP    Sleep apnea     Past Surgical History:  Procedure Laterality Date   COLONOSCOPY  2010   negative   KNEE ARTHROSCOPY     R 2015 & L 2016; [facility]   REPLACEMENT TOTAL KNEE Right 01/15/2018   REPLACEMENT TOTAL KNEE Left 02/19/2018   UPPER GASTROINTESTINAL ENDOSCOPY     Dr Jarold Motto; multiple  snce 1992   VASECTOMY     WISDOM TOOTH EXTRACTION      Family History  Problem Relation Age of Onset   Esophageal cancer Father    Stomach cancer Father    Hypertension Father    Heart  disease Father        CBAG @ 50   Hypertension Mother    Heart attack Mother 67   Cancer Mother    Hypertension Sister    Hypertension Sister    Hypertension Sister    Stroke Neg Hx    Diabetes Neg Hx    Early death Neg Hx    Hyperlipidemia Neg Hx    Kidney disease Neg Hx    Alcohol abuse Neg Hx    Rectal cancer Neg Hx     Social History   Socioeconomic History   Marital status: Married    Spouse name: Not on file   Number of children: 2   Years of education: 12   Highest education level: Not on file  Occupational History   Occupation: dry cleaners  Tobacco Use   Smoking status: Never   Smokeless tobacco: Never  Vaping Use   Vaping Use: Never used  Substance and Sexual Activity   Alcohol use: No   Drug use: No   Sexual activity: Yes  Other Topics Concern   Not on file  Social History Narrative   Caffeine: Drinks 2 sodas a  day   Social Determinants of Health   Financial Resource Strain: Not on file  Food Insecurity: Not on file  Transportation Needs: Not on file  Physical Activity: Not on file  Stress: Not on file  Social Connections: Not on file  Intimate Partner Violence: Not on file    Outpatient Medications Prior to Visit  Medication Sig Dispense Refill   BD PEN NEEDLE MICRO U/F 32G X 6 MM MISC USE AS DIRECTED 100 each 1   celecoxib (CELEBREX) 200 MG capsule Take 200 mg by mouth daily as needed.     Cholecalciferol (VITAMIN D3) 50 MCG (2000 UT) TABS TAKE 1 TABLET BY MOUTH DAILY 90 tablet 1   Continuous Blood Gluc Receiver (FREESTYLE LIBRE 2 READER) DEVI 1 Act by Does not apply route daily. 6 each 1   Continuous Blood Gluc Sensor (FREESTYLE LIBRE 3 SENSOR) MISC 1 Act by Does not apply route daily. Place 1 sensor on the skin every 14 days. Use to check glucose continuously 2 each 5   esomeprazole (NEXIUM) 40 MG capsule TAKE 1 CAPSULE BY MOUTH EVERY MORNING BEFORE BREAKFAST 30 capsule 5   fexofenadine (ALLEGRA) 180 MG tablet Take 180 mg by mouth daily.      Insulin Glargine-Lixisenatide (SOLIQUA) 100-33 UNT-MCG/ML SOPN ADMINISTER 30 UNITS UNDER THE SKIN DAILY 18 mL 1   irbesartan (AVAPRO) 300 MG tablet TAKE 1 TABLET(300 MG) BY MOUTH DAILY 90 tablet 0   metFORMIN (GLUCOPHAGE-XR) 750 MG 24 hr tablet TAKE 2 TABLETS(1500 MG) BY MOUTH DAILY WITH BREAKFAST 180 tablet 1   potassium chloride SA (KLOR-CON M) 20 MEQ tablet TAKE 1 TABLET BY MOUTH DAILY 90 tablet 0   psyllium (REGULOID) 0.52 g capsule Take 0.52 g by mouth daily.     rosuvastatin (CRESTOR) 10 MG tablet TAKE 1 TABLET(10 MG) BY MOUTH DAILY 90 tablet 1   triamterene-hydrochlorothiazide (DYAZIDE) 37.5-25 MG capsule TAKE 1 CAPSULE BY MOUTH DAILY 90 capsule 0   No facility-administered medications prior to visit.    Allergies  Allergen Reactions   Diltiazem Other (See Comments)    Leg edema   Spironolactone Other (See Comments)    gynecomastia    ROS     Objective:    Physical Exam Constitutional:      General: He is not in acute distress.    Appearance: He is not ill-appearing.  HENT:     Right Ear: Tympanic membrane and ear canal normal.     Left Ear: Tympanic membrane and ear canal normal.     Nose: Congestion present.     Right Sinus: No maxillary sinus tenderness or frontal sinus tenderness.     Left Sinus: No maxillary sinus tenderness or frontal sinus tenderness.     Mouth/Throat:     Mouth: Mucous membranes are moist.     Pharynx: Oropharynx is clear. No oropharyngeal exudate or posterior oropharyngeal erythema.  Eyes:     Extraocular Movements:     Right eye: Normal extraocular motion.     Left eye: Normal extraocular motion.     Conjunctiva/sclera: Conjunctivae normal.     Pupils: Pupils are equal, round, and reactive to light.  Cardiovascular:     Rate and Rhythm: Normal rate and regular rhythm.  Pulmonary:     Effort: Pulmonary effort is normal.     Breath sounds: Normal breath sounds.  Musculoskeletal:     Cervical back: Normal range of motion and neck supple.   Lymphadenopathy:     Cervical: No cervical  adenopathy.  Skin:    General: Skin is warm and dry.  Neurological:     General: No focal deficit present.     Mental Status: He is alert and oriented to person, place, and time.  Psychiatric:        Mood and Affect: Mood normal.        Behavior: Behavior normal.     BP (!) 148/88 (BP Location: Left Arm, Patient Position: Sitting, Cuff Size: Large)   Pulse 75   Temp 97.8 F (36.6 C) (Temporal)   Ht  (1.778 m)   Wt 235 lb (106.6 kg)   SpO2 97%   BMI 33.72 kg/m  Wt Readings from Last 3 Encounters:  02/12/23 235 lb (106.6 kg)  01/09/23 238 lb (108 kg)  08/14/22 236 lb 4 oz (107.2 kg)       Assessment & Plan:   Problem List Items Addressed This Visit       Other   B12 deficiency   Other Visit Diagnoses     Acute cough    -  Primary   Relevant Medications   benzonatate (TESSALON) 200 MG capsule   Acute URI       Laryngitis          Viral URI with NP cough x 3 days. Recommend symptomatic management.   Continue taking Delsym for cough suppression and add plain Mucinex (not DM) to help with congestion. Drink plenty of water for these medications to work well.  Switch allergy medication to either Xyzal, Claritin or Zyrtec (the generic is fine).  Use over the counter nasal sprays such as Flonase for nasal congestion and runny nose.  Salt water gargles and throat lozenges for hoarseness and sore throat.   prescribed Tessalon for cough  Let us know if worsening or not improving in the next 2-3 days.    I am having Charmayne Sheer. [employer]" start on benzonatate. I am also having him maintain his fexofenadine, esomeprazole, celecoxib, psyllium, FreeStyle Libre 2 Reader, Vitamin D3, triamterene-hydrochlorothiazide, potassium chloride SA, metFORMIN, rosuvastatin, irbesartan, BD Pen Needle Micro U/F, FreeStyle Libre 3 Sensor, and [place]. We administered cyanocobalamin.  Meds ordered this encounter  Medications    cyanocobalamin (VITAMIN B12) injection 1,000 mcg   benzonatate (TESSALON) 200 MG capsule    Sig: Take 1 capsule (200 mg total) by mouth 2 (two) times daily as needed for cough.    Dispense:  20 capsule    Refill:  0    Order Specific Question:   Supervising Provider    Answer:   Hillard Danker A [[id number]]

## 2023-02-14 ENCOUNTER — Other Ambulatory Visit: Payer: Self-pay | Admitting: Family Medicine

## 2023-02-14 ENCOUNTER — Telehealth: Payer: Self-pay | Admitting: Internal Medicine

## 2023-02-14 ENCOUNTER — Other Ambulatory Visit: Payer: Self-pay | Admitting: Internal Medicine

## 2023-02-14 DIAGNOSIS — I10 Essential (primary) hypertension: Secondary | ICD-10-CM

## 2023-02-14 DIAGNOSIS — E876 Hypokalemia: Secondary | ICD-10-CM

## 2023-02-14 MED ORDER — AZITHROMYCIN 250 MG PO TABS: ORAL_TABLET | ORAL | 0 refills | Status: DC

## 2023-02-14 MED ORDER — AZITHROMYCIN 250 MG PO TABS: ORAL_TABLET | ORAL | 0 refills | Status: AC

## 2023-02-14 NOTE — Telephone Encounter (Signed)
Saw pt 4/17 for acute cough and laryngitis, was prescribed tessalon pearls. Pt states he is still not improving and would like antibiotic

## 2023-02-14 NOTE — Addendum Note (Signed)
Addended by: Marinus Maw on: 02/14/2023 01:18 PM   Modules accepted: Orders

## 2023-02-14 NOTE — Telephone Encounter (Signed)
Patient called and states that he still has the cough and hoarsness - patient wants to know if you can call in an antibiotic - he has used the over the counter medications that you recommended.  Pharmacy:  [facility] on [address] - [place]

## 2023-02-14 NOTE — Telephone Encounter (Signed)
Called and informed pt medication was sent, pt verbalized understanding

## 2023-03-11 DIAGNOSIS — L820 Inflamed seborrheic keratosis: Secondary | ICD-10-CM | POA: Diagnosis not present

## 2023-03-11 DIAGNOSIS — L570 Actinic keratosis: Secondary | ICD-10-CM | POA: Diagnosis not present

## 2023-03-11 DIAGNOSIS — D225 Melanocytic nevi of trunk: Secondary | ICD-10-CM | POA: Diagnosis not present

## 2023-03-11 DIAGNOSIS — L918 Other hypertrophic disorders of the skin: Secondary | ICD-10-CM | POA: Diagnosis not present

## 2023-03-11 DIAGNOSIS — D1801 Hemangioma of skin and subcutaneous tissue: Secondary | ICD-10-CM | POA: Diagnosis not present

## 2023-03-11 DIAGNOSIS — L814 Other melanin hyperpigmentation: Secondary | ICD-10-CM | POA: Diagnosis not present

## 2023-03-14 ENCOUNTER — Other Ambulatory Visit: Payer: Self-pay | Admitting: Internal Medicine

## 2023-03-14 DIAGNOSIS — E876 Hypokalemia: Secondary | ICD-10-CM

## 2023-03-14 DIAGNOSIS — I10 Essential (primary) hypertension: Secondary | ICD-10-CM

## 2023-03-19 ENCOUNTER — Ambulatory Visit (INDEPENDENT_AMBULATORY_CARE_PROVIDER_SITE_OTHER): Payer: Medicare Other

## 2023-03-19 DIAGNOSIS — E538 Deficiency of other specified B group vitamins: Secondary | ICD-10-CM | POA: Diagnosis not present

## 2023-03-19 MED ORDER — CYANOCOBALAMIN 1000 MCG/ML IJ SOLN
1000.0000 ug | Freq: Once | INTRAMUSCULAR | Status: AC
Start: 2023-03-19 — End: 2023-03-19
  Administered 2023-03-19: 1000 ug via INTRAMUSCULAR

## 2023-03-19 NOTE — Progress Notes (Signed)
Pt was given B12 w/o any complications. 

## 2023-04-15 ENCOUNTER — Encounter: Payer: Medicare Other | Admitting: Internal Medicine

## 2023-04-21 ENCOUNTER — Other Ambulatory Visit: Payer: Self-pay | Admitting: Internal Medicine

## 2023-04-21 DIAGNOSIS — E119 Type 2 diabetes mellitus without complications: Secondary | ICD-10-CM

## 2023-04-25 ENCOUNTER — Other Ambulatory Visit: Payer: Self-pay | Admitting: Internal Medicine

## 2023-04-25 DIAGNOSIS — E119 Type 2 diabetes mellitus without complications: Secondary | ICD-10-CM

## 2023-04-25 DIAGNOSIS — E118 Type 2 diabetes mellitus with unspecified complications: Secondary | ICD-10-CM

## 2023-05-02 ENCOUNTER — Other Ambulatory Visit: Payer: Self-pay | Admitting: Internal Medicine

## 2023-05-02 DIAGNOSIS — I10 Essential (primary) hypertension: Secondary | ICD-10-CM

## 2023-05-02 DIAGNOSIS — I2510 Atherosclerotic heart disease of native coronary artery without angina pectoris: Secondary | ICD-10-CM

## 2023-05-09 DIAGNOSIS — Z0100 Encounter for examination of eyes and vision without abnormal findings: Secondary | ICD-10-CM | POA: Diagnosis not present

## 2023-05-09 LAB — HM DIABETES EYE EXAM

## 2023-05-10 ENCOUNTER — Other Ambulatory Visit: Payer: Self-pay | Admitting: Internal Medicine

## 2023-05-10 DIAGNOSIS — I10 Essential (primary) hypertension: Secondary | ICD-10-CM

## 2023-05-10 DIAGNOSIS — E876 Hypokalemia: Secondary | ICD-10-CM

## 2023-05-10 DIAGNOSIS — E785 Hyperlipidemia, unspecified: Secondary | ICD-10-CM

## 2023-05-10 DIAGNOSIS — I2510 Atherosclerotic heart disease of native coronary artery without angina pectoris: Secondary | ICD-10-CM

## 2023-05-13 ENCOUNTER — Encounter: Payer: Self-pay | Admitting: Family Medicine

## 2023-05-13 ENCOUNTER — Ambulatory Visit (INDEPENDENT_AMBULATORY_CARE_PROVIDER_SITE_OTHER): Payer: Medicare Other | Admitting: Internal Medicine

## 2023-05-13 ENCOUNTER — Encounter: Payer: Self-pay | Admitting: Internal Medicine

## 2023-05-13 VITALS — BP 132/84 | HR 80 | Temp 97.8°F | Resp 16 | Ht 70.0 in | Wt 233.0 lb

## 2023-05-13 DIAGNOSIS — I10 Essential (primary) hypertension: Secondary | ICD-10-CM | POA: Diagnosis not present

## 2023-05-13 DIAGNOSIS — R351 Nocturia: Secondary | ICD-10-CM | POA: Diagnosis not present

## 2023-05-13 DIAGNOSIS — E785 Hyperlipidemia, unspecified: Secondary | ICD-10-CM

## 2023-05-13 DIAGNOSIS — Z0000 Encounter for general adult medical examination without abnormal findings: Secondary | ICD-10-CM | POA: Diagnosis not present

## 2023-05-13 DIAGNOSIS — Z23 Encounter for immunization: Secondary | ICD-10-CM

## 2023-05-13 DIAGNOSIS — Z0001 Encounter for general adult medical examination with abnormal findings: Secondary | ICD-10-CM

## 2023-05-13 DIAGNOSIS — D693 Immune thrombocytopenic purpura: Secondary | ICD-10-CM | POA: Diagnosis not present

## 2023-05-13 DIAGNOSIS — Z794 Long term (current) use of insulin: Secondary | ICD-10-CM

## 2023-05-13 DIAGNOSIS — E538 Deficiency of other specified B group vitamins: Secondary | ICD-10-CM

## 2023-05-13 DIAGNOSIS — E119 Type 2 diabetes mellitus without complications: Secondary | ICD-10-CM

## 2023-05-13 DIAGNOSIS — N401 Enlarged prostate with lower urinary tract symptoms: Secondary | ICD-10-CM | POA: Diagnosis not present

## 2023-05-13 LAB — CBC WITH DIFFERENTIAL/PLATELET
Basophils Absolute: 0 10*3/uL (ref 0.0–0.1)
Basophils Relative: 1 % (ref 0.0–3.0)
Eosinophils Absolute: 0.1 10*3/uL (ref 0.0–0.7)
Eosinophils Relative: 2.2 % (ref 0.0–5.0)
HCT: 47.4 % (ref 39.0–52.0)
Hemoglobin: 16.3 g/dL (ref 13.0–17.0)
Lymphocytes Relative: 28.3 % (ref 12.0–46.0)
Lymphs Abs: 1.1 10*3/uL (ref 0.7–4.0)
MCHC: 34.5 g/dL (ref 30.0–36.0)
MCV: 93.9 fl (ref 78.0–100.0)
Monocytes Absolute: 0.4 10*3/uL (ref 0.1–1.0)
Monocytes Relative: 10.7 % (ref 3.0–12.0)
Neutro Abs: 2.3 10*3/uL (ref 1.4–7.7)
Neutrophils Relative %: 57.8 % (ref 43.0–77.0)
Platelets: 123 10*3/uL — ABNORMAL LOW (ref 150.0–400.0)
RBC: 5.04 Mil/uL (ref 4.22–5.81)
RDW: 12.8 % (ref 11.5–15.5)
WBC: 4 10*3/uL (ref 4.0–10.5)

## 2023-05-13 LAB — PSA: PSA: 0.74 ng/mL (ref 0.10–4.00)

## 2023-05-13 LAB — LIPID PANEL
Cholesterol: 144 mg/dL (ref 0–200)
HDL: 31.9 mg/dL — ABNORMAL LOW (ref >39.00)
NonHDL: 111.74
Total CHOL/HDL Ratio: 5
Triglycerides: 321 mg/dL — ABNORMAL HIGH (ref 0.0–149.0)
VLDL: 64.2 mg/dL — ABNORMAL HIGH (ref 0.0–40.0)

## 2023-05-13 LAB — URINALYSIS, ROUTINE W REFLEX MICROSCOPIC
Bilirubin Urine: NEGATIVE
Hgb urine dipstick: NEGATIVE
Ketones, ur: NEGATIVE
Leukocytes,Ua: NEGATIVE
Nitrite: NEGATIVE
RBC / HPF: NONE SEEN (ref 0–?)
Specific Gravity, Urine: 1.01 (ref 1.000–1.030)
Total Protein, Urine: NEGATIVE
Urine Glucose: NEGATIVE
Urobilinogen, UA: 0.2 (ref 0.0–1.0)
WBC, UA: NONE SEEN (ref 0–?)
pH: 7 (ref 5.0–8.0)

## 2023-05-13 LAB — BASIC METABOLIC PANEL
BUN: 28 mg/dL — ABNORMAL HIGH (ref 6–23)
CO2: 27 mEq/L (ref 19–32)
Calcium: 10 mg/dL (ref 8.4–10.5)
Chloride: 97 mEq/L (ref 96–112)
Creatinine, Ser: 0.97 mg/dL (ref 0.40–1.50)
GFR: 81.65 mL/min (ref >60.00)
Glucose, Bld: 142 mg/dL — ABNORMAL HIGH (ref 70–99)
Potassium: 3.8 mEq/L (ref 3.5–5.1)
Sodium: 135 mEq/L (ref 135–145)

## 2023-05-13 LAB — LDL CHOLESTEROL, DIRECT: Direct LDL: 68 mg/dL

## 2023-05-13 LAB — TSH: TSH: 3.72 u[IU]/mL (ref 0.35–5.50)

## 2023-05-13 LAB — HEMOGLOBIN A1C: Hgb A1c MFr Bld: 8.5 % — ABNORMAL HIGH (ref 4.6–6.5)

## 2023-05-13 MED ORDER — SOLIQUA 100-33 UNT-MCG/ML ~~LOC~~ SOPN
60.0000 [IU] | PEN_INJECTOR | Freq: Every day | SUBCUTANEOUS | 1 refills | Status: DC
Start: 2023-05-13 — End: 2023-11-25

## 2023-05-13 NOTE — Progress Notes (Unsigned)
Subjective:  Patient ID: Alex [name], male    DOB: [birth year]  Age: 67 y.o. MRN: [id number]  CC: Annual Exam, Hypertension, Hyperlipidemia, and Diabetes   HPI Alex [name] presents for a CPX and f/up -----  Discussed the use of AI scribe software for clinical note transcription with the patient, who gave verbal consent to proceed.  History of Present Illness   The patient, with a history of diabetes, presents for an annual physical. He reports feeling well and maintaining an active lifestyle, attending [employer] two to three times a week. He denies any chest pain, shortness of breath, dizziness, or lightheadedness during these activities.  The patient's heart rate was noted to be low at 49, but he denies any associated symptoms such as weakness, dizziness, or feeling like he is about to pass out. He recently had an eye exam, which was reported as good.  The patient expresses concern about a potential increase in her A1c, despite maintaining his insulin and medication regimen. He takes his insulin and medications at the same time, then eats, and is unsure if this is a good or bad habit. He has not noticed any symptoms of high or low blood sugar, other than what her Libre 3 device indicates. He has not received any low alarms but has turned off the high alarm. He estimates his A1c to be close to 9 based on her 90-day readings from the [facility].  The patient's current diabetes management includes 30 units of Siliqua and two 750 mg pills of Metformin taken simultaneously. He recalls taking an additional small pill when first diagnosed with diabetes but discontinued it about a year ago when her blood sugar decreased to 6.4. His last A1c reading was 7.7 three to four months ago.  Several years ago, the patient underwent a stress test for an unspecified reason, which yielded normal results. He has not seen a cardiologist since.       Outpatient Medications Prior to Visit  Medication  Sig Dispense Refill   BD PEN NEEDLE MICRO U/F 32G X 6 MM MISC USE DAILY AS DIRECTED 100 each 1   celecoxib (CELEBREX) 200 MG capsule Take 200 mg by mouth daily as needed.     Cholecalciferol (VITAMIN D3) 50 MCG (2000 UT) TABS TAKE 1 TABLET BY MOUTH DAILY 90 tablet 1   Continuous Blood Gluc Receiver (FREESTYLE LIBRE 2 READER) DEVI 1 Act by Does not apply route daily. 6 each 1   Continuous Blood Gluc Sensor (FREESTYLE LIBRE 3 SENSOR) MISC 1 Act by Does not apply route daily. Place 1 sensor on the skin every 14 days. Use to check glucose continuously 2 each 5   esomeprazole (NEXIUM) 40 MG capsule TAKE 1 CAPSULE BY MOUTH EVERY MORNING BEFORE BREAKFAST 30 capsule 5   fexofenadine (ALLEGRA) 180 MG tablet Take 180 mg by mouth daily.     irbesartan (AVAPRO) 300 MG tablet TAKE 1 TABLET(300 MG) BY MOUTH DAILY 90 tablet 0   metFORMIN (GLUCOPHAGE-XR) 750 MG 24 hr tablet TAKE 2 TABLETS(1500 MG) BY MOUTH DAILY WITH BREAKFAST 180 tablet 0   potassium chloride SA (KLOR-CON M) 20 MEQ tablet TAKE 1 TABLET BY MOUTH DAILY 90 tablet 0   psyllium (REGULOID) 0.52 g capsule Take 0.52 g by mouth daily.     rosuvastatin (CRESTOR) 10 MG tablet TAKE 1 TABLET(10 MG) BY MOUTH DAILY 90 tablet 0   triamterene-hydrochlorothiazide (DYAZIDE) 37.5-25 MG capsule TAKE 1 CAPSULE BY MOUTH DAILY 90 capsule 0  benzonatate (TESSALON) 200 MG capsule Take 1 capsule (200 mg total) by mouth 2 (two) times daily as needed for cough. 20 capsule 0   Insulin Glargine-Lixisenatide (SOLIQUA) 100-33 UNT-MCG/ML SOPN ADMINISTER 30 UNITS UNDER THE SKIN DAILY 18 mL 1   No facility-administered medications prior to visit.    ROS Review of Systems  Constitutional:  Negative for diaphoresis, fatigue and unexpected weight change.  HENT: Negative.    Eyes: Negative.  Negative for visual disturbance.  Respiratory:  Negative for cough, chest tightness, shortness of breath and wheezing.   Cardiovascular:  Negative for chest pain, palpitations and leg  swelling.  Gastrointestinal:  Negative for abdominal pain, diarrhea, nausea and vomiting.  Genitourinary:  Positive for difficulty urinating. Negative for dysuria and hematuria.  Musculoskeletal: Negative.  Negative for arthralgias and myalgias.  Skin:  Negative for color change, pallor and rash.  Neurological: Negative.  Negative for dizziness, weakness, light-headedness and headaches.  Hematological:  Negative for adenopathy. Does not bruise/bleed easily.  Psychiatric/Behavioral: Negative.      Objective:  BP 132/84 (BP Location: Left Arm, Patient Position: Sitting, Cuff Size: Large)   Pulse 80   Temp 97.8 F (36.6 C) (Oral)   Resp 16   Ht 5\' 10"  (1.778 m)   Wt 233 lb (105.7 kg)   SpO2 99%   BMI 33.43 kg/m   BP Readings from Last 3 Encounters:  05/13/23 132/84  02/12/23 (!) 148/88  01/09/23 (!) 148/88    Wt Readings from Last 3 Encounters:  05/13/23 233 lb (105.7 kg)  02/12/23 235 lb (106.6 kg)  01/09/23 238 lb (108 kg)    Physical Exam Vitals reviewed.  HENT:     Nose: Nose normal.     Mouth/Throat:     Mouth: Mucous membranes are moist.  Eyes:     General: No scleral icterus.    Conjunctiva/sclera: Conjunctivae normal.  Cardiovascular:     Rate and Rhythm: Normal rate and regular rhythm.     Heart sounds: Normal heart sounds, S1 normal and S2 normal. No murmur heard.    No friction rub. No gallop.     Comments: EKG- NSR, 82 bpm Anterior/inferior infarct pattern is old No LVH or acute ST/T wave changes Pulmonary:     Effort: Pulmonary effort is normal.     Breath sounds: No stridor. No wheezing, rhonchi or rales.  Abdominal:     General: Abdomen is protuberant. Bowel sounds are normal.     Palpations: Abdomen is soft. There is no hepatomegaly, splenomegaly or mass.     Tenderness: There is no abdominal tenderness. There is no guarding.     Hernia: No hernia is present. There is no hernia in the left inguinal area or right inguinal area.  Genitourinary:     Pubic Area: No rash.      Penis: Normal and circumcised.      Testes: Normal.     Epididymis:     Right: Normal.     Left: Normal.     Prostate: Enlarged. Not tender and no nodules present.     Rectum: Normal. Guaiac result negative. No mass, tenderness, anal fissure, external hemorrhoid or internal hemorrhoid. Normal anal tone.  Musculoskeletal:     Cervical back: Neck supple.  Lymphadenopathy:     Lower Body: No right inguinal adenopathy. No left inguinal adenopathy.  Neurological:     Mental Status: He is alert.     Lab Results  Component Value Date   WBC 4.0 05/13/2023  HGB 16.3 05/13/2023   HCT 47.4 05/13/2023   PLT 123.0 (L) 05/13/2023   GLUCOSE 142 (H) 05/13/2023   CHOL 144 05/13/2023   TRIG 321.0 (H) 05/13/2023   HDL 31.90 (L) 05/13/2023   LDLDIRECT 68.0 05/13/2023   LDLCALC 61 04/09/2022   ALT 65 (H) 01/09/2023   AST 35 01/09/2023   NA 135 05/13/2023   K 3.8 05/13/2023   CL 97 05/13/2023   CREATININE 0.97 05/13/2023   BUN 28 (H) 05/13/2023   CO2 27 05/13/2023   TSH 3.72 05/13/2023   PSA 0.74 05/13/2023   INR 1.0 11/14/2021   HGBA1C 8.5 (H) 05/13/2023   MICROALBUR 0.8 01/09/2023    CT Chest Wo Contrast  Result Date: 07/28/2019 CLINICAL DATA:  Lung nodule, less than 1 cm. EXAM: CT CHEST WITHOUT CONTRAST TECHNIQUE: Multidetector CT imaging of the chest was performed following the standard protocol without IV contrast. COMPARISON:  CT chest July 10, 2018 FINDINGS: Cardiovascular: Scattered atherosclerosis and signs of coronary artery disease. Extensive coronary artery calcifications including left main and 3 vessels along with right coronary circulation. No pericardial effusion. Central pulmonary arteries are normal caliber. Vessels not well evaluated given lack of contrast. Mediastinum/Nodes: No signs of mediastinal lymphadenopathy. No axillary or hilar lymphadenopathy.  No thoracic inlet adenopathy. Lungs/Pleura: No signs effusion or consolidation.  Scattered calcified nodules. No new pulmonary nodules. 4 mm right lower lobe pulmonary nodule unchanged (image 87, series 3) 5 x 5 mm nodule in the left costodiaphragmatic sulcus, stable when measured by this observer on the previous exam. Triangular perifissural opacity right chest along the minor fissure likely small subpleural lymph node slightly larger than on the prior study. Subtle ground-glass without focal characteristics present in the superior segment of the right lower lobe. Airways are patent. Upper Abdomen: Lobular liver contour with signs of fissural retraction/widening. Visualized spleen is likely mildly enlarged between 12 and 13 cm. Musculoskeletal: No signs of acute bone finding or destructive bone process. IMPRESSION: 1. Small approximately 5 mm pulmonary nodules are demonstrated in the lung bases as described. No change with direct measurement comparison. 2. Atherosclerosis and coronary artery disease. 3. Lobular hepatic contour raising the question of liver disease. Clinical and laboratory correlation may be helpful spleen is also at the upper limits of normal to slightly enlarged at approximately 12-13 cm. Aortic Atherosclerosis (ICD10-I70.0). Electronically Signed   By: Donzetta Kohut M.D.   On: 07/28/2019 10:10    Assessment & Plan:   B12 deficiency -     CBC with Differential/Platelet; Future  Chronic ITP (idiopathic thrombocytopenia) (HCC)- His platelets are stable. -     CBC with Differential/Platelet; Future  Essential hypertension- His blood pressure is well-controlled. -     EKG 12-Lead -     Basic metabolic panel; Future -     TSH; Future -     Urinalysis, Routine w reflex microscopic; Future  Hyperlipidemia with target LDL less than 100 - LDL goal achieved. Doing well on the statin  -     Lipid panel; Future -     Lipoprotein A (LPA); Future -     TSH; Future  Insulin-requiring or dependent type II diabetes mellitus (HCC)- He has not achieved his A1c goal.  Will  slowly increase the dose of Soliqua. -     Basic metabolic panel; Future -     Hemoglobin A1c; Future -     Urinalysis, Routine w reflex microscopic; Future -     Soliqua; Inject 60 Units  into the skin daily.  Dispense: 54 mL; Refill: 1  Encounter for general adult medical examination with abnormal findings- Exam completed, labs reviewed, vaccines reviewed and updated, cancer screenings are up-to-date, patient education was given.  BPH associated with nocturia -     PSA; Future -     Urinalysis, Routine w reflex microscopic; Future  Need for prophylactic vaccination with combined diphtheria-tetanus-pertussis (DTP) vaccine -     Boostrix; Inject 0.5 mLs into the muscle once for 1 dose.  Dispense: 0.5 mL; Refill: 0  Other orders -     LDL cholesterol, direct     Follow-up: Return in about 6 months (around 11/13/2023).  Sanda Linger, MD

## 2023-05-13 NOTE — Patient Instructions (Signed)
Health Maintenance, Male Adopting a healthy lifestyle and getting preventive care are important in promoting health and wellness. Ask your health care provider about: The right schedule for you to have regular tests and exams. Things you can do on your own to prevent diseases and keep yourself healthy. What should I know about diet, weight, and exercise? Eat a healthy diet  Eat a diet that includes plenty of vegetables, fruits, low-fat dairy products, and lean protein. Do not eat a lot of foods that are high in solid fats, added sugars, or sodium. Maintain a healthy weight Body mass index (BMI) is a measurement that can be used to identify possible weight problems. It estimates body fat based on height and weight. Your health care provider can help determine your BMI and help you achieve or maintain a healthy weight. Get regular exercise Get regular exercise. This is one of the most important things you can do for your health. Most adults should: Exercise for at least 150 minutes each week. The exercise should increase your heart rate and make you sweat (moderate-intensity exercise). Do strengthening exercises at least twice a week. This is in addition to the moderate-intensity exercise. Spend less time sitting. Even light physical activity can be beneficial. Watch cholesterol and blood lipids Have your blood tested for lipids and cholesterol at 66 years of age, then have this test every 5 years. You may need to have your cholesterol levels checked more often if: Your lipid or cholesterol levels are high. You are older than 66 years of age. You are at high risk for heart disease. What should I know about cancer screening? Many types of cancers can be detected early and may often be prevented. Depending on your health history and family history, you may need to have cancer screening at various ages. This may include screening for: Colorectal cancer. Prostate cancer. Skin cancer. Lung  cancer. What should I know about heart disease, diabetes, and high blood pressure? Blood pressure and heart disease High blood pressure causes heart disease and increases the risk of stroke. This is more likely to develop in people who have high blood pressure readings or are overweight. Talk with your health care provider about your target blood pressure readings. Have your blood pressure checked: Every 3-5 years if you are 18-39 years of age. Every year if you are 40 years old or older. If you are between the ages of 65 and 75 and are a current or former smoker, ask your health care provider if you should have a one-time screening for abdominal aortic aneurysm (AAA). Diabetes Have regular diabetes screenings. This checks your fasting blood sugar level. Have the screening done: Once every three years after age 45 if you are at a normal weight and have a low risk for diabetes. More often and at a younger age if you are overweight or have a high risk for diabetes. What should I know about preventing infection? Hepatitis B If you have a higher risk for hepatitis B, you should be screened for this virus. Talk with your health care provider to find out if you are at risk for hepatitis B infection. Hepatitis C Blood testing is recommended for: Everyone born from 1945 through 1965. Anyone with known risk factors for hepatitis C. Sexually transmitted infections (STIs) You should be screened each year for STIs, including gonorrhea and chlamydia, if: You are sexually active and are younger than 66 years of age. You are older than 66 years of age and your   health care provider tells you that you are at risk for this type of infection. Your sexual activity has changed since you were last screened, and you are at increased risk for chlamydia or gonorrhea. Ask your health care provider if you are at risk. Ask your health care provider about whether you are at high risk for HIV. Your health care provider  may recommend a prescription medicine to help prevent HIV infection. If you choose to take medicine to prevent HIV, you should first get tested for HIV. You should then be tested every 3 months for as long as you are taking the medicine. Follow these instructions at home: Alcohol use Do not drink alcohol if your health care provider tells you not to drink. If you drink alcohol: Limit how much you have to 0-2 drinks a day. Know how much alcohol is in your drink. In the [place] one drink equals one 12 oz bottle of beer (355 mL), one 5 oz glass of wine (148 mL), or one 1 oz glass of hard liquor (44 mL). Lifestyle Do not use any products that contain nicotine or tobacco. These products include cigarettes, chewing tobacco, and vaping devices, such as e-cigarettes. If you need help quitting, ask your health care provider. Do not use street drugs. Do not share needles. Ask your health care provider for help if you need support or information about quitting drugs. General instructions Schedule regular health, dental, and eye exams. Stay current with your vaccines. Tell your health care provider if: You often feel depressed. You have ever been abused or do not feel safe at home. Summary Adopting a healthy lifestyle and getting preventive care are important in promoting health and wellness. Follow your health care provider's instructions about healthy diet, exercising, and getting tested or screened for diseases. Follow your health care provider's instructions on monitoring your cholesterol and blood pressure. This information is not intended to replace advice given to you by your health care provider. Make sure you discuss any questions you have with your health care provider. Document Revised: 03/05/2021 Document Reviewed: 03/05/2021 Elsevier Patient Education  2024 [employer]  

## 2023-05-18 LAB — LIPOPROTEIN A (LPA): Lipoprotein (a): <10 nmol/L (ref <75)

## 2023-05-21 MED ORDER — BOOSTRIX 5-2.5-18.5 LF-MCG/0.5 IM SUSP
0.5000 mL | Freq: Once | INTRAMUSCULAR | 0 refills | Status: AC
Start: 2023-05-21 — End: 2023-05-21

## 2023-05-28 ENCOUNTER — Encounter (INDEPENDENT_AMBULATORY_CARE_PROVIDER_SITE_OTHER): Payer: Self-pay

## 2023-05-30 ENCOUNTER — Other Ambulatory Visit: Payer: Self-pay | Admitting: Internal Medicine

## 2023-05-30 DIAGNOSIS — E876 Hypokalemia: Secondary | ICD-10-CM

## 2023-05-30 DIAGNOSIS — I10 Essential (primary) hypertension: Secondary | ICD-10-CM

## 2023-05-30 DIAGNOSIS — E559 Vitamin D deficiency, unspecified: Secondary | ICD-10-CM

## 2023-06-18 ENCOUNTER — Ambulatory Visit (INDEPENDENT_AMBULATORY_CARE_PROVIDER_SITE_OTHER): Payer: Medicare Other | Admitting: *Deleted

## 2023-06-18 DIAGNOSIS — E538 Deficiency of other specified B group vitamins: Secondary | ICD-10-CM | POA: Diagnosis not present

## 2023-06-18 MED ORDER — CYANOCOBALAMIN 1000 MCG/ML IJ SOLN
1000.0000 ug | Freq: Once | INTRAMUSCULAR | Status: AC
Start: 2023-06-18 — End: 2023-06-18
  Administered 2023-06-18: 1000 ug via INTRAMUSCULAR

## 2023-06-18 NOTE — Progress Notes (Signed)
Pls cosign for B12 inj../lmb  

## 2023-07-10 ENCOUNTER — Other Ambulatory Visit: Payer: Self-pay | Admitting: Internal Medicine

## 2023-07-10 DIAGNOSIS — E118 Type 2 diabetes mellitus with unspecified complications: Secondary | ICD-10-CM

## 2023-07-10 DIAGNOSIS — E119 Type 2 diabetes mellitus without complications: Secondary | ICD-10-CM

## 2023-07-21 ENCOUNTER — Other Ambulatory Visit: Payer: Self-pay | Admitting: Internal Medicine

## 2023-07-21 DIAGNOSIS — E119 Type 2 diabetes mellitus without complications: Secondary | ICD-10-CM

## 2023-07-24 ENCOUNTER — Ambulatory Visit: Payer: Medicare Other

## 2023-07-24 DIAGNOSIS — E538 Deficiency of other specified B group vitamins: Secondary | ICD-10-CM

## 2023-07-24 MED ORDER — CYANOCOBALAMIN 1000 MCG/ML IJ SOLN
1000.0000 ug | Freq: Once | INTRAMUSCULAR | Status: AC
Start: 2023-07-24 — End: 2023-07-24
  Administered 2023-07-24: 1000 ug via INTRAMUSCULAR

## 2023-07-24 NOTE — Progress Notes (Signed)
Pt here for monthly B12 injection per Dr. Yetta Barre   B12 given IM and pt tolerated injection well.  Patient was advised to report to the office if he notices any adverse reactions.

## 2023-07-25 ENCOUNTER — Other Ambulatory Visit: Payer: Self-pay | Admitting: Internal Medicine

## 2023-07-25 DIAGNOSIS — I2510 Atherosclerotic heart disease of native coronary artery without angina pectoris: Secondary | ICD-10-CM

## 2023-07-25 DIAGNOSIS — E785 Hyperlipidemia, unspecified: Secondary | ICD-10-CM

## 2023-07-25 DIAGNOSIS — I10 Essential (primary) hypertension: Secondary | ICD-10-CM

## 2023-08-19 ENCOUNTER — Other Ambulatory Visit: Payer: Self-pay | Admitting: Internal Medicine

## 2023-08-19 DIAGNOSIS — E876 Hypokalemia: Secondary | ICD-10-CM

## 2023-08-19 DIAGNOSIS — I10 Essential (primary) hypertension: Secondary | ICD-10-CM

## 2023-08-23 ENCOUNTER — Other Ambulatory Visit: Payer: Self-pay | Admitting: Internal Medicine

## 2023-08-23 DIAGNOSIS — E876 Hypokalemia: Secondary | ICD-10-CM

## 2023-08-23 DIAGNOSIS — I10 Essential (primary) hypertension: Secondary | ICD-10-CM

## 2023-08-25 ENCOUNTER — Ambulatory Visit (INDEPENDENT_AMBULATORY_CARE_PROVIDER_SITE_OTHER): Payer: Medicare Other | Admitting: *Deleted

## 2023-08-25 DIAGNOSIS — E538 Deficiency of other specified B group vitamins: Secondary | ICD-10-CM

## 2023-08-25 MED ORDER — CYANOCOBALAMIN 1000 MCG/ML IJ SOLN
1000.0000 ug | Freq: Once | INTRAMUSCULAR | Status: AC
Start: 2023-08-25 — End: 2023-08-25
  Administered 2023-08-25: 1000 ug via INTRAMUSCULAR

## 2023-08-25 NOTE — Progress Notes (Signed)
PT visits today to receive their B-12 injection. PT was informed about the medication and tolerated the injection well. PT was informed to reach out to the office if needed.

## 2023-09-02 ENCOUNTER — Other Ambulatory Visit: Payer: Self-pay | Admitting: Internal Medicine

## 2023-09-02 ENCOUNTER — Telehealth: Payer: Self-pay | Admitting: Internal Medicine

## 2023-09-02 ENCOUNTER — Other Ambulatory Visit: Payer: Self-pay

## 2023-09-02 DIAGNOSIS — E119 Type 2 diabetes mellitus without complications: Secondary | ICD-10-CM

## 2023-09-02 MED ORDER — FREESTYLE LIBRE 3 SENSOR MISC: 1.0000 | Freq: Every day | 5 refills | Status: DC

## 2023-09-02 NOTE — Telephone Encounter (Signed)
1.Medication Requested: Continuous Glucose Sensor (FREESTYLE LIBRE 3 SENSOR) MISC    2. Pharmacy (Name, Street, [place]): [facility] [address] [place], [place] [address] 3. On Med List: yes    4. Last Visit with PCP:  7.16.24 5. Next visit date with PCP: 2.5.25 Patient was wondering if he could get these medications refill since he has an upcoming appointment with PCP     Agent: Please be advised that RX refills may take up to 3 business days. We ask that you follow-up with your pharmacy.

## 2023-09-02 NOTE — Telephone Encounter (Signed)
Refill sent to Dr. Yetta Barre

## 2023-09-29 ENCOUNTER — Ambulatory Visit (INDEPENDENT_AMBULATORY_CARE_PROVIDER_SITE_OTHER): Payer: Medicare Other

## 2023-09-29 DIAGNOSIS — E538 Deficiency of other specified B group vitamins: Secondary | ICD-10-CM

## 2023-09-29 MED ORDER — CYANOCOBALAMIN 1000 MCG/ML IJ SOLN
1000.0000 ug | Freq: Once | INTRAMUSCULAR | Status: AC
  Administered 2023-09-29: 1000 ug via INTRAMUSCULAR

## 2023-09-29 NOTE — Progress Notes (Signed)
PT visits today for their b-12 injection. PT informed of what they were receiving and tolerated injection well. PT notified to reach back out to office if needed.

## 2023-10-03 ENCOUNTER — Ambulatory Visit (INDEPENDENT_AMBULATORY_CARE_PROVIDER_SITE_OTHER): Payer: Medicare Other

## 2023-10-03 VITALS — BP 138/90 | HR 81 | Ht 70.0 in | Wt 235.0 lb

## 2023-10-03 DIAGNOSIS — Z0000 Encounter for general adult medical examination without abnormal findings: Secondary | ICD-10-CM | POA: Diagnosis not present

## 2023-10-03 NOTE — Patient Instructions (Signed)
Alex [name] , Thank you for taking time to come for your Medicare Wellness Visit. I appreciate your ongoing commitment to your health goals. Please review the following plan we discussed and let me know if I can assist you in the future.   Referrals/Orders/Follow-Ups/Clinician Recommendations: Keep up the good work.  It was nice meeting you today.  Merry Christmas.  This is a list of the screening recommended for you and due dates:  Health Maintenance  Topic Date Due   Zoster (Shingles) Vaccine (1 of 2) Never done   Pneumonia Vaccine (1 of 1 - PCV) Never done   DTaP/Tdap/Td vaccine (3 - Td or Tdap) 12/16/2022   Flu Shot  Never done   COVID-19 Vaccine (4 - 2023-24 season) 06/29/2023   Complete foot exam   08/15/2023   Hemoglobin A1C  11/13/2023   Yearly kidney health urinalysis for diabetes  01/09/2024   Eye exam for diabetics  05/08/2024   Yearly kidney function blood test for diabetes  05/12/2024   Medicare Annual Wellness Visit  10/02/2024   Colon Cancer Screening  07/19/2029   Hepatitis C Screening  Completed   HPV Vaccine  Aged Out    Advanced directives: (Declined) Advance directive discussed with you today. Even though you declined this today, please call our office should you change your mind, and we can give you the proper paperwork for you to fill out.  Next Medicare Annual Wellness Visit scheduled for next year: Yes

## 2023-10-03 NOTE — Progress Notes (Signed)
Subjective:   Alex [name] is a 67 y.o. male who presents for an Initial Medicare Annual Wellness Visit.  Visit Complete: In person   Cardiac Risk Factors include: advanced age (>21men, >69 women);hypertension;male gender;dyslipidemia;Other (see comment), Risk factor comments: BPH     Objective:    Today's Vitals   10/03/23 0850 10/03/23 0913  BP: (!) 145/100 (!) 138/90  Pulse: 81   SpO2: 95%   Weight: 235 lb (106.6 kg)   Height: 5\' 10"  (1.778 m)    Body mass index is 33.72 kg/m.     10/03/2023    8:57 AM 01/07/2022    3:54 PM 07/20/2019    9:56 AM 07/28/2014    1:39 PM  Advanced Directives  Does Patient Have a Medical Advance Directive? No No No No  Would patient like information on creating a medical advance directive?  No - Patient declined  No - patient declined information    Current Medications (verified) Outpatient Encounter Medications as of 10/03/2023  Medication Sig   BD PEN NEEDLE MICRO U/F 32G X 6 MM MISC USE DAILY AS DIRECTED   celecoxib (CELEBREX) 200 MG capsule Take 200 mg by mouth daily as needed.   Cholecalciferol (VITAMIN D3) 50 MCG (2000 UT) TABS TAKE 1 TABLET BY MOUTH DAILY   Continuous Blood Gluc Receiver (FREESTYLE LIBRE 2 READER) DEVI 1 Act by Does not apply route daily.   Continuous Glucose Sensor (FREESTYLE LIBRE 3 SENSOR) MISC 1 Act by Does not apply route daily. Place 1 sensor on the skin every 14 days. Use to check glucose continuously   esomeprazole (NEXIUM) 40 MG capsule TAKE 1 CAPSULE BY MOUTH EVERY MORNING BEFORE BREAKFAST   fexofenadine (ALLEGRA) 180 MG tablet Take 180 mg by mouth daily.   Insulin Glargine-Lixisenatide (SOLIQUA) 100-33 UNT-MCG/ML SOPN Inject 60 Units into the skin daily.   irbesartan (AVAPRO) 300 MG tablet TAKE 1 TABLET(300 MG) BY MOUTH DAILY   metFORMIN (GLUCOPHAGE-XR) 750 MG 24 hr tablet TAKE 2 TABLETS(1500 MG) BY MOUTH DAILY WITH BREAKFAST   potassium chloride SA (KLOR-CON M) 20 MEQ tablet TAKE 1 TABLET BY MOUTH  DAILY   psyllium (REGULOID) 0.52 g capsule Take 0.52 g by mouth daily.   rosuvastatin (CRESTOR) 10 MG tablet TAKE 1 TABLET(10 MG) BY MOUTH DAILY   triamterene-hydrochlorothiazide (DYAZIDE) 37.5-25 MG capsule TAKE 1 CAPSULE BY MOUTH DAILY   No facility-administered encounter medications on file as of 10/03/2023.    Allergies (verified) Diltiazem and Spironolactone   History: Past Medical History:  Diagnosis Date   Achilles tendinitis    PMH of   Allergy    Arthritis    B12 deficiency    Barrett's esophagus    Diabetes mellitus without complication (HCC)    Diverticulosis of colon (without mention of hemorrhage) 2010   Fatty liver 10-16-2011   [place]   GERD (gastroesophageal reflux disease)    Sullivan Lone syndrome    Hiatal hernia    Homocystinemia    Hyperlipidemia    Hypertension    Nonspecific elevation of levels of transaminase or lactic acid dehydrogenase (LDH)    PMH of   OSA on CPAP    Sleep apnea    Past Surgical History:  Procedure Laterality Date   COLONOSCOPY  2010   negative   KNEE ARTHROSCOPY     R 2015 & L 2016; [facility]   REPLACEMENT TOTAL KNEE Right 01/15/2018   REPLACEMENT TOTAL KNEE Left 02/19/2018   UPPER GASTROINTESTINAL ENDOSCOPY  Dr Jarold Motto; multiple  snce 1992   VASECTOMY     WISDOM TOOTH EXTRACTION     Family History  Problem Relation Age of Onset   Esophageal cancer Father    Stomach cancer Father    Hypertension Father    Heart disease Father        CBAG @ 56   Hypertension Mother    Heart attack Mother 3   Cancer Mother    Hypertension Sister    Hypertension Sister    Hypertension Sister    Stroke Neg Hx    Diabetes Neg Hx    Early death Neg Hx    Hyperlipidemia Neg Hx    Kidney disease Neg Hx    Alcohol abuse Neg Hx    Rectal cancer Neg Hx    Social History   Socioeconomic History   Marital status: Married    Spouse name: Investment banker, corporate   Number of children: 2   Years of education: 12   Highest education level: Not on file   Occupational History   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  Tobacco Use   Smoking status: Never   Smokeless tobacco: Never  Vaping Use   Vaping status: Never Used  Substance and Sexual Activity   Alcohol use: No   Drug use: No   Sexual activity: Yes  Other Topics Concern   Not on file  Social History Narrative   Caffeine: Drinks 2 sodas a day   Lives with wife   Social Determinants of Health   Financial Resource Strain: Low Risk  (10/03/2023)   Overall Financial Resource Strain (CARDIA)    Difficulty of Paying Living Expenses: Not hard at all  Food Insecurity: No Food Insecurity (10/03/2023)   Hunger Vital Sign    Worried About Running Out of Food in the Last Year: Never true    Ran Out of Food in the Last Year: Never true  Transportation Needs: No Transportation Needs (10/03/2023)   PRAPARE - Administrator, Civil Service (Medical): No    Lack of Transportation (Non-Medical): No  Physical Activity: Insufficiently Active (10/03/2023)   Exercise Vital Sign    Days of Exercise per Week: 2 days    Minutes of Exercise per Session: 40 min  Stress: No Stress Concern Present (10/03/2023)   [employer] of Occupational Health - Occupational Stress Questionnaire    Feeling of Stress : Not at all  Social Connections: Moderately Integrated (10/03/2023)   Social Connection and Isolation Panel [NHANES]    Frequency of Communication with Friends and Family: More than three times a week    Frequency of Social Gatherings with Friends and Family: Three times a week    Attends Religious Services: More than 4 times per year    Active Member of Clubs or Organizations: No    Attends Banker Meetings: Never    Marital Status: Married    Tobacco Counseling Counseling given: Not Answered   Clinical Intake:  Pre-visit preparation completed: Yes  Pain : No/denies pain     BMI - recorded: 33.72 Nutritional Status: BMI > 30  Obese Nutritional Risks:  None Diabetes: Yes CBG done?: Yes (119) CBG resulted in Enter/ Edit results?: No Did pt. bring in CBG monitor from home?: No  How often do you need to have someone help you when you read instructions, pamphlets, or other written materials from your doctor or pharmacy?: 1 - Never  Interpreter Needed?: No  Information entered by :: Adaleigh Warf,  RMA   Activities of Daily Living    10/03/2023    8:42 AM  In your present state of health, do you have any difficulty performing the following activities:  Hearing? 0  Vision? 0  Difficulty concentrating or making decisions? 0  Walking or climbing stairs? 0  Dressing or bathing? 0  Doing errands, shopping? 0  Preparing Food and eating ? N  Using the Toilet? N  In the past six months, have you accidently leaked urine? N  Do you have problems with loss of bowel control? N  Managing your Medications? N  Managing your Finances? N  Housekeeping or managing your Housekeeping? N    Patient Care Team: Etta Grandchild, MD as PCP - General (Internal Medicine)  Indicate any recent Medical Services you may have received from other than [facility] providers in the past year (date may be approximate).     Assessment:   This is a routine wellness examination for [place].  Hearing/Vision screen Hearing Screening - Comments:: Denies hearing difficulties   Vision Screening - Comments:: Wears eyeglasses    Goals Addressed               This Visit's Progress     Patient Stated (pt-stated)        Try to lose some weight off of his belly.        Depression Screen    10/03/2023    9:00 AM 01/09/2023   10:12 AM 08/14/2022   10:35 AM 01/07/2022    3:54 PM 04/05/2021    8:24 AM 03/30/2020    9:59 AM 03/25/2019   11:09 AM  PHQ 2/9 Scores  PHQ - 2 Score 0 0 0 0 0 0 0  PHQ- 9 Score 0      0    Fall Risk    10/03/2023    8:57 AM 01/09/2023   10:11 AM 08/14/2022   10:35 AM 01/07/2022    3:54 PM 04/05/2021    8:24 AM  Fall Risk   Falls in the  past year? 0 0 0 0 0  Number falls in past yr: 0 0 0  0  Injury with Fall? 0 0 0  0  Risk for fall due to : No Fall Risks No Fall Risks No Fall Risks    Follow up Falls prevention discussed;Falls evaluation completed Falls evaluation completed Falls evaluation completed      MEDICARE RISK AT HOME: Medicare Risk at Home Any stairs in or around the home?: No Home free of loose throw rugs in walkways, pet beds, electrical cords, etc?: Yes Adequate lighting in your home to reduce risk of falls?: Yes Life alert?: No Use of a cane, walker or w/c?: No Grab bars in the bathroom?: No Shower chair or bench in shower?: No Elevated toilet seat or a handicapped toilet?: No  TIMED UP AND GO:  Was the test performed? Yes  Length of time to ambulate 10 feet: 10 sec Gait steady and fast without use of assistive device    Cognitive Function:        10/03/2023    8:44 AM  6CIT Screen  What Year? 0 points  What month? 0 points  What time? 0 points  Count back from 20 0 points  Months in reverse 0 points  Repeat phrase 0 points  Total Score 0 points    Immunizations Immunization History  Administered Date(s) Administered   PFIZER(Purple Top)SARS-COV-2 Vaccination 12/31/2019, 02/01/2020, 09/29/2020   Td  06/29/1997   Tdap 12/16/2012    TDAP status: Due, Education has been provided regarding the importance of this vaccine. Advised may receive this vaccine at local pharmacy or Health Dept. Aware to provide a copy of the vaccination record if obtained from local pharmacy or Health Dept. Verbalized acceptance and understanding.  Flu Vaccine status: Declined, Education has been provided regarding the importance of this vaccine but patient still declined. Advised may receive this vaccine at local pharmacy or Health Dept. Aware to provide a copy of the vaccination record if obtained from local pharmacy or Health Dept. Verbalized acceptance and understanding.  Pneumococcal vaccine status:  Declined,  Education has been provided regarding the importance of this vaccine but patient still declined. Advised may receive this vaccine at local pharmacy or Health Dept. Aware to provide a copy of the vaccination record if obtained from local pharmacy or Health Dept. Verbalized acceptance and understanding.   Covid-19 vaccine status: Declined, Education has been provided regarding the importance of this vaccine but patient still declined. Advised may receive this vaccine at local pharmacy or Health Dept.or vaccine clinic. Aware to provide a copy of the vaccination record if obtained from local pharmacy or Health Dept. Verbalized acceptance and understanding.  Qualifies for Shingles Vaccine? Yes   Zostavax completed  Declines   Shingrix Completed?: No.    Education has been provided regarding the importance of this vaccine. Patient has been advised to call insurance company to determine out of pocket expense if they have not yet received this vaccine. Advised may also receive vaccine at local pharmacy or Health Dept. Verbalized acceptance and understanding.  Screening Tests Health Maintenance  Topic Date Due   FOOT EXAM  10/13/2023 (Originally 08/15/2023)   COVID-19 Vaccine (4 - 2023-24 season) 10/19/2023 (Originally 06/29/2023)   DTaP/Tdap/Td (3 - Td or Tdap) 11/28/2023 (Originally 12/16/2022)   Zoster Vaccines- Shingrix (1 of 2) 01/01/2024 (Originally 05/20/2007)   INFLUENZA VACCINE  01/26/2024 (Originally 05/29/2023)   Pneumonia Vaccine 26+ Years old (1 of 1 - PCV) 10/02/2024 (Originally 05/19/2022)   HEMOGLOBIN A1C  11/13/2023   Diabetic kidney evaluation - Urine ACR  01/09/2024   OPHTHALMOLOGY EXAM  05/08/2024   Diabetic kidney evaluation - eGFR measurement  05/12/2024   Medicare Annual Wellness (AWV)  10/02/2024   Colonoscopy  07/19/2029   Hepatitis C Screening  Completed   HPV VACCINES  Aged Out    Health Maintenance  There are no preventive care reminders to display for this  patient.   Colorectal cancer screening: Type of screening: Colonoscopy. Completed 07/20/2019. Repeat every 10 years  Lung Cancer Screening: (Low Dose CT Chest recommended if Age 7-80 years, 20 pack-year currently smoking OR have quit w/in 15years.) does not qualify.   Lung Cancer Screening Referral: N/A  Additional Screening:  Hepatitis C Screening: does qualify; Completed 01/17/2016  Vision Screening: Recommended annual ophthalmology exams for early detection of glaucoma and other disorders of the eye. Is the patient up to date with their annual eye exam?  Yes  Who is the provider or what is the name of the office in which the patient attends annual eye exams? [facility]/ Dr. Yetta Barre If pt is not established with a provider, would they like to be referred to a provider to establish care? No .   Dental Screening: Recommended annual dental exams for proper oral hygiene  Diabetic Foot Exam: Diabetic Foot Exam: Overdue, Pt has been advised about the importance in completing this exam. Pt is scheduled for  diabetic foot exam on 10/13/2023.  Community Resource Referral / Chronic Care Management: CRR required this visit?  No   CCM required this visit?  No    Plan:     I have personally reviewed and noted the following in the patient's chart:   Medical and social history Use of alcohol, tobacco or illicit drugs  Current medications and supplements including opioid prescriptions. Patient is not currently taking opioid prescriptions. Functional ability and status Nutritional status Physical activity Advanced directives List of other physicians Hospitalizations, surgeries, and ER visits in previous 12 months Vitals Screenings to include cognitive, depression, and falls Referrals and appointments  In addition, I have reviewed and discussed with patient certain preventive protocols, quality metrics, and best practice recommendations. A written personalized care plan for preventive  services as well as general preventive health recommendations were provided to patient.     Haim Hansson L Mitesh Rosendahl, CMA   10/03/2023   After Visit Summary: (MyChart) Due to this being a telephonic visit, the after visit summary with patients personalized plan was offered to patient via MyChart   Nurse Notes: Patient is due for a Tdap and declines all other vaccines today.  He is also due for a foot exam, which he has an appointment with Dr. Yetta Barre coming up soon.  Patient had no other concerns to address today.

## 2023-10-07 ENCOUNTER — Other Ambulatory Visit: Payer: Self-pay | Admitting: Internal Medicine

## 2023-10-07 DIAGNOSIS — E119 Type 2 diabetes mellitus without complications: Secondary | ICD-10-CM

## 2023-10-07 DIAGNOSIS — E118 Type 2 diabetes mellitus with unspecified complications: Secondary | ICD-10-CM

## 2023-10-13 ENCOUNTER — Ambulatory Visit: Payer: Medicare Other | Admitting: Internal Medicine

## 2023-10-13 ENCOUNTER — Encounter: Payer: Self-pay | Admitting: Internal Medicine

## 2023-10-13 VITALS — BP 152/90 | HR 85 | Temp 97.5°F | Ht 70.0 in | Wt 233.6 lb

## 2023-10-13 DIAGNOSIS — I10 Essential (primary) hypertension: Secondary | ICD-10-CM | POA: Diagnosis not present

## 2023-10-13 DIAGNOSIS — Z794 Long term (current) use of insulin: Secondary | ICD-10-CM

## 2023-10-13 DIAGNOSIS — E119 Type 2 diabetes mellitus without complications: Secondary | ICD-10-CM | POA: Diagnosis not present

## 2023-10-13 DIAGNOSIS — E538 Deficiency of other specified B group vitamins: Secondary | ICD-10-CM | POA: Diagnosis not present

## 2023-10-13 DIAGNOSIS — D693 Immune thrombocytopenic purpura: Secondary | ICD-10-CM | POA: Diagnosis not present

## 2023-10-13 DIAGNOSIS — K7581 Nonalcoholic steatohepatitis (NASH): Secondary | ICD-10-CM

## 2023-10-13 DIAGNOSIS — Z23 Encounter for immunization: Secondary | ICD-10-CM | POA: Insufficient documentation

## 2023-10-13 LAB — URINALYSIS, ROUTINE W REFLEX MICROSCOPIC
Bilirubin Urine: NEGATIVE
Hgb urine dipstick: NEGATIVE
Ketones, ur: NEGATIVE
Leukocytes,Ua: NEGATIVE
Nitrite: NEGATIVE
RBC / HPF: NONE SEEN (ref 0–?)
Specific Gravity, Urine: 1.02 (ref 1.000–1.030)
Total Protein, Urine: NEGATIVE
Urine Glucose: NEGATIVE
Urobilinogen, UA: 0.2 (ref 0.0–1.0)
pH: 6.5 (ref 5.0–8.0)

## 2023-10-13 LAB — HEMOGLOBIN A1C: Hgb A1c MFr Bld: 8.1 % — ABNORMAL HIGH (ref 4.6–6.5)

## 2023-10-13 LAB — CBC WITH DIFFERENTIAL/PLATELET
Basophils Absolute: 0 10*3/uL (ref 0.0–0.1)
Basophils Relative: 1.1 % (ref 0.0–3.0)
Eosinophils Absolute: 0.1 10*3/uL (ref 0.0–0.7)
Eosinophils Relative: 3.2 % (ref 0.0–5.0)
HCT: 46.9 % (ref 39.0–52.0)
Hemoglobin: 16.2 g/dL (ref 13.0–17.0)
Lymphocytes Relative: 32.8 % (ref 12.0–46.0)
Lymphs Abs: 1.1 10*3/uL (ref 0.7–4.0)
MCHC: 34.6 g/dL (ref 30.0–36.0)
MCV: 94.4 fL (ref 78.0–100.0)
Monocytes Absolute: 0.3 10*3/uL (ref 0.1–1.0)
Monocytes Relative: 10.7 % (ref 3.0–12.0)
Neutro Abs: 1.7 10*3/uL (ref 1.4–7.7)
Neutrophils Relative %: 52.2 % (ref 43.0–77.0)
Platelets: 113 10*3/uL — ABNORMAL LOW (ref 150.0–400.0)
RBC: 4.96 Mil/uL (ref 4.22–5.81)
RDW: 12.9 % (ref 11.5–15.5)
WBC: 3.2 10*3/uL — ABNORMAL LOW (ref 4.0–10.5)

## 2023-10-13 LAB — PROTIME-INR
INR: 1.1 {ratio} — ABNORMAL HIGH (ref 0.8–1.0)
Prothrombin Time: 12.1 s (ref 9.6–13.1)

## 2023-10-13 LAB — HEPATIC FUNCTION PANEL
ALT: 57 U/L — ABNORMAL HIGH (ref 0–53)
AST: 40 U/L — ABNORMAL HIGH (ref 0–37)
Albumin: 4.4 g/dL (ref 3.5–5.2)
Alkaline Phosphatase: 44 U/L (ref 39–117)
Bilirubin, Direct: 0.2 mg/dL (ref 0.0–0.3)
Total Bilirubin: 1.2 mg/dL (ref 0.2–1.2)
Total Protein: 7.2 g/dL (ref 6.0–8.3)

## 2023-10-13 LAB — BASIC METABOLIC PANEL
BUN: 17 mg/dL (ref 6–23)
CO2: 28 meq/L (ref 19–32)
Calcium: 9.3 mg/dL (ref 8.4–10.5)
Chloride: 101 meq/L (ref 96–112)
Creatinine, Ser: 0.85 mg/dL (ref 0.40–1.50)
GFR: 90.62 mL/min (ref >60.00)
Glucose, Bld: 111 mg/dL — ABNORMAL HIGH (ref 70–99)
Potassium: 3.7 meq/L (ref 3.5–5.1)
Sodium: 138 meq/L (ref 135–145)

## 2023-10-13 LAB — FOLATE: Folate: >24.2 ng/mL (ref >5.9)

## 2023-10-13 MED ORDER — BOOSTRIX 5-2.5-18.5 LF-MCG/0.5 IM SUSP: 0.5000 mL | Freq: Once | INTRAMUSCULAR | 0 refills | Status: AC

## 2023-10-13 NOTE — Patient Instructions (Signed)
Hypertension, Adult High blood pressure (hypertension) is when the force of blood pumping through the arteries is too strong. The arteries are the blood vessels that carry blood from the heart throughout the body. Hypertension forces the heart to work harder to pump blood and may cause arteries to become narrow or stiff. Untreated or uncontrolled hypertension can lead to a heart attack, heart failure, a stroke, kidney disease, and other problems. A blood pressure reading consists of a higher number over a lower number. Ideally, your blood pressure should be below 120/80. The first ("top") number is called the systolic pressure. It is a measure of the pressure in your arteries as your heart beats. The second ("bottom") number is called the diastolic pressure. It is a measure of the pressure in your arteries as the heart relaxes. What are the causes? The exact cause of this condition is not known. There are some conditions that result in high blood pressure. What increases the risk? Certain factors may make you more likely to develop high blood pressure. Some of these risk factors are under your control, including: Smoking. Not getting enough exercise or physical activity. Being overweight. Having too much fat, sugar, calories, or salt (sodium) in your diet. Drinking too much alcohol. Other risk factors include: Having a personal history of heart disease, diabetes, high cholesterol, or kidney disease. Stress. Having a family history of high blood pressure and high cholesterol. Having obstructive sleep apnea. Age. The risk increases with age. What are the signs or symptoms? High blood pressure may not cause symptoms. Very high blood pressure (hypertensive crisis) may cause: Headache. Fast or irregular heartbeats (palpitations). Shortness of breath. Nosebleed. Nausea and vomiting. Vision changes. Severe chest pain, dizziness, and seizures. How is this diagnosed? This condition is diagnosed by  measuring your blood pressure while you are seated, with your arm resting on a flat surface, your legs uncrossed, and your feet flat on the floor. The cuff of the blood pressure monitor will be placed directly against the skin of your upper arm at the level of your heart. Blood pressure should be measured at least twice using the same arm. Certain conditions can cause a difference in blood pressure between your right and left arms. If you have a high blood pressure reading during one visit or you have normal blood pressure with other risk factors, you may be asked to: Return on a different day to have your blood pressure checked again. Monitor your blood pressure at home for 1 week or longer. If you are diagnosed with hypertension, you may have other blood or imaging tests to help your health care provider understand your overall risk for other conditions. How is this treated? This condition is treated by making healthy lifestyle changes, such as eating healthy foods, exercising more, and reducing your alcohol intake. You may be referred for counseling on a healthy diet and physical activity. Your health care provider may prescribe medicine if lifestyle changes are not enough to get your blood pressure under control and if: Your systolic blood pressure is above 130. Your diastolic blood pressure is above 80. Your personal target blood pressure may vary depending on your medical conditions, your age, and other factors. Follow these instructions at home: Eating and drinking  Eat a diet that is high in fiber and potassium, and low in sodium, added sugar, and fat. An example of this eating plan is called the DASH diet. DASH stands for Dietary Approaches to Stop Hypertension. To eat this way: Eat   plenty of fresh fruits and vegetables. Try to fill one half of your plate at each meal with fruits and vegetables. Eat whole grains, such as whole-wheat pasta, brown rice, or whole-grain bread. Fill about one  fourth of your plate with whole grains. Eat or drink low-fat dairy products, such as skim milk or low-fat yogurt. Avoid fatty cuts of meat, processed or cured meats, and poultry with skin. Fill about one fourth of your plate with lean proteins, such as fish, chicken without skin, beans, eggs, or tofu. Avoid pre-made and processed foods. These tend to be higher in sodium, added sugar, and fat. Reduce your daily sodium intake. Many people with hypertension should eat less than 1,500 mg of sodium a day. Do not drink alcohol if: Your health care provider tells you not to drink. You are pregnant, may be pregnant, or are planning to become pregnant. If you drink alcohol: Limit how much you have to: 0-1 drink a day for women. 0-2 drinks a day for men. Know how much alcohol is in your drink. In the [place] one drink equals one 12 oz bottle of beer (355 mL), one 5 oz glass of wine (148 mL), or one 1 oz glass of hard liquor (44 mL). Lifestyle  Work with your health care provider to maintain a healthy body weight or to lose weight. Ask what an ideal weight is for you. Get at least 30 minutes of exercise that causes your heart to beat faster (aerobic exercise) most days of the week. Activities may include walking, swimming, or biking. Include exercise to strengthen your muscles (resistance exercise), such as Pilates or lifting weights, as part of your weekly exercise routine. Try to do these types of exercises for 30 minutes at least 3 days a week. Do not use any products that contain nicotine or tobacco. These products include cigarettes, chewing tobacco, and vaping devices, such as e-cigarettes. If you need help quitting, ask your health care provider. Monitor your blood pressure at home as told by your health care provider. Keep all follow-up visits. This is important. Medicines Take over-the-counter and prescription medicines only as told by your health care provider. Follow directions carefully. Blood  pressure medicines must be taken as prescribed. Do not skip doses of blood pressure medicine. Doing this puts you at risk for problems and can make the medicine less effective. Ask your health care provider about side effects or reactions to medicines that you should watch for. Contact a health care provider if you: Think you are having a reaction to a medicine you are taking. Have headaches that keep coming back (recurring). Feel dizzy. Have swelling in your ankles. Have trouble with your vision. Get help right away if you: Develop a severe headache or confusion. Have unusual weakness or numbness. Feel faint. Have severe pain in your chest or abdomen. Vomit repeatedly. Have trouble breathing. These symptoms may be an emergency. Get help right away. Call 911. Do not wait to see if the symptoms will go away. Do not drive yourself to the hospital. Summary Hypertension is when the force of blood pumping through your arteries is too strong. If this condition is not controlled, it may put you at risk for serious complications. Your personal target blood pressure may vary depending on your medical conditions, your age, and other factors. For most people, a normal blood pressure is less than 120/80. Hypertension is treated with lifestyle changes, medicines, or a combination of both. Lifestyle changes include losing weight, eating a healthy,   low-sodium diet, exercising more, and limiting alcohol. This information is not intended to replace advice given to you by your health care provider. Make sure you discuss any questions you have with your health care provider. Document Revised: 08/21/2021 Document Reviewed: 08/21/2021 Elsevier Patient Education  2024 [employer]  

## 2023-10-13 NOTE — Progress Notes (Unsigned)
Subjective:  Patient ID: Alex [name], male    DOB: May 27, 1956  Age: 67 y.o. MRN: [id number]  CC: Hypertension and Diabetes   HPI OKOYE HAGUE presents for f/up -----  Discussed the use of AI scribe software for clinical note transcription with the patient, who gave verbal consent to proceed.  History of Present Illness   The patient, with a history of diabetes, hypertension, and retired for over a year, reports no concerning symptoms while staying active. He denies chest pain, shortness of breath, dizziness, lightheadedness, blood pressure symptoms, headache, blurred vision, and diabetes symptoms such as excessive thirst and urination. He reports regular urination and no symptoms of hyperglycemia or hypoglycemia.  The patient is on insulin therapy, currently at 54 units, and has had only two instances of hypoglycemia requiring treatment with candy in the past year and a half. He reports good blood sugar control in the mornings, but numbers tend to elevate as he eats throughout the day. He is considering increasing his insulin dose by two units in the upcoming week.  The patient denies any abdominal symptoms such as pain, nausea, vomiting, diarrhea, or constipation.       Outpatient Medications Prior to Visit  Medication Sig Dispense Refill   BD PEN NEEDLE MICRO U/F 32G X 6 MM MISC USE DAILY AS DIRECTED 100 each 1   celecoxib (CELEBREX) 200 MG capsule Take 200 mg by mouth daily as needed.     Cholecalciferol (VITAMIN D3) 50 MCG (2000 UT) TABS TAKE 1 TABLET BY MOUTH DAILY 90 tablet 1   Continuous Glucose Sensor (FREESTYLE LIBRE 3 SENSOR) MISC 1 Act by Does not apply route daily. Place 1 sensor on the skin every 14 days. Use to check glucose continuously 2 each 5   esomeprazole (NEXIUM) 40 MG capsule TAKE 1 CAPSULE BY MOUTH EVERY MORNING BEFORE BREAKFAST 30 capsule 5   fexofenadine (ALLEGRA) 180 MG tablet Take 180 mg by mouth daily.     Insulin Glargine-Lixisenatide (SOLIQUA)  100-33 UNT-MCG/ML SOPN Inject 60 Units into the skin daily. 54 mL 1   irbesartan (AVAPRO) 300 MG tablet TAKE 1 TABLET(300 MG) BY MOUTH DAILY 90 tablet 0   metFORMIN (GLUCOPHAGE-XR) 750 MG 24 hr tablet TAKE 2 TABLETS(1500 MG) BY MOUTH DAILY WITH BREAKFAST 180 tablet 0   potassium chloride SA (KLOR-CON M) 20 MEQ tablet TAKE 1 TABLET BY MOUTH DAILY 90 tablet 0   rosuvastatin (CRESTOR) 10 MG tablet TAKE 1 TABLET(10 MG) BY MOUTH DAILY 90 tablet 0   triamterene-hydrochlorothiazide (DYAZIDE) 37.5-25 MG capsule TAKE 1 CAPSULE BY MOUTH DAILY 90 capsule 0   Continuous Blood Gluc Receiver (FREESTYLE LIBRE 2 READER) DEVI 1 Act by Does not apply route daily. 6 each 1   psyllium (REGULOID) 0.52 g capsule Take 0.52 g by mouth daily.     No facility-administered medications prior to visit.    ROS Review of Systems  Constitutional:  Negative for appetite change, chills, diaphoresis, fatigue and fever.  HENT: Negative.    Eyes: Negative.   Respiratory: Negative.  Negative for cough, chest tightness, shortness of breath and wheezing.   Cardiovascular:  Negative for chest pain, palpitations and leg swelling.  Gastrointestinal:  Negative for abdominal pain, constipation, diarrhea, nausea and vomiting.  Genitourinary: Negative.  Negative for difficulty urinating.  Musculoskeletal: Negative.  Negative for arthralgias and myalgias.  Skin: Negative.   Neurological:  Negative for dizziness, weakness and light-headedness.  Hematological:  Negative for adenopathy. Does not bruise/bleed easily.  Psychiatric/Behavioral:  Negative.      Objective:  BP (!) 152/90 (BP Location: Left Arm, Patient Position: Sitting, Cuff Size: Normal)   Pulse 85   Temp (!) 97.5 F (36.4 C) (Oral)   Ht 5\' 10"  (1.778 m)   Wt 233 lb 9.6 oz (106 kg)   SpO2 92%   BMI 33.52 kg/m   BP Readings from Last 3 Encounters:  10/13/23 (!) 152/90  10/03/23 (!) 138/90  05/13/23 132/84    Wt Readings from Last 3 Encounters:  10/13/23 233 lb  9.6 oz (106 kg)  10/03/23 235 lb (106.6 kg)  05/13/23 233 lb (105.7 kg)    Physical Exam Vitals reviewed.  Constitutional:      Appearance: Normal appearance.  HENT:     Mouth/Throat:     Mouth: Mucous membranes are moist.  Eyes:     General: No scleral icterus.    Conjunctiva/sclera: Conjunctivae normal.  Cardiovascular:     Rate and Rhythm: Normal rate and regular rhythm.     Heart sounds: No murmur heard. Pulmonary:     Effort: Pulmonary effort is normal.     Breath sounds: No stridor. No wheezing, rhonchi or rales.  Abdominal:     General: Abdomen is protuberant. Bowel sounds are normal. There is no distension.     Palpations: Abdomen is soft. There is no hepatomegaly, splenomegaly or mass.     Tenderness: There is no abdominal tenderness. There is no guarding or rebound.  Musculoskeletal:        General: Normal range of motion.     Cervical back: Neck supple.     Right lower leg: No edema.     Left lower leg: No edema.  Lymphadenopathy:     Cervical: No cervical adenopathy.  Skin:    General: Skin is warm and dry.     Coloration: Skin is not pale.  Neurological:     General: No focal deficit present.     Mental Status: He is alert. Mental status is at baseline.  Psychiatric:        Behavior: Behavior normal.     Lab Results  Component Value Date   WBC 3.2 (L) 10/13/2023   HGB 16.2 10/13/2023   HCT 46.9 10/13/2023   PLT 113.0 (L) 10/13/2023   GLUCOSE 111 (H) 10/13/2023   CHOL 144 05/13/2023   TRIG 321.0 (H) 05/13/2023   HDL 31.90 (L) 05/13/2023   LDLDIRECT 68.0 05/13/2023   LDLCALC 61 04/09/2022   ALT 57 (H) 10/13/2023   AST 40 (H) 10/13/2023   NA 138 10/13/2023   K 3.7 10/13/2023   CL 101 10/13/2023   CREATININE 0.85 10/13/2023   BUN 17 10/13/2023   CO2 28 10/13/2023   TSH 3.72 05/13/2023   PSA 0.74 05/13/2023   INR 1.1 (H) 10/13/2023   HGBA1C 8.1 (H) 10/13/2023   MICROALBUR 0.8 01/09/2023    CT Chest Wo Contrast Result Date:  07/28/2019 CLINICAL DATA:  Lung nodule, less than 1 cm. EXAM: CT CHEST WITHOUT CONTRAST TECHNIQUE: Multidetector CT imaging of the chest was performed following the standard protocol without IV contrast. COMPARISON:  CT chest July 10, 2018 FINDINGS: Cardiovascular: Scattered atherosclerosis and signs of coronary artery disease. Extensive coronary artery calcifications including left main and 3 vessels along with right coronary circulation. No pericardial effusion. Central pulmonary arteries are normal caliber. Vessels not well evaluated given lack of contrast. Mediastinum/Nodes: No signs of mediastinal lymphadenopathy. No axillary or hilar lymphadenopathy.  No thoracic inlet adenopathy. Lungs/Pleura:  No signs effusion or consolidation. Scattered calcified nodules. No new pulmonary nodules. 4 mm right lower lobe pulmonary nodule unchanged (image 87, series 3) 5 x 5 mm nodule in the left costodiaphragmatic sulcus, stable when measured by this observer on the previous exam. Triangular perifissural opacity right chest along the minor fissure likely small subpleural lymph node slightly larger than on the prior study. Subtle ground-glass without focal characteristics present in the superior segment of the right lower lobe. Airways are patent. Upper Abdomen: Lobular liver contour with signs of fissural retraction/widening. Visualized spleen is likely mildly enlarged between 12 and 13 cm. Musculoskeletal: No signs of acute bone finding or destructive bone process. IMPRESSION: 1. Small approximately 5 mm pulmonary nodules are demonstrated in the lung bases as described. No change with direct measurement comparison. 2. Atherosclerosis and coronary artery disease. 3. Lobular hepatic contour raising the question of liver disease. Clinical and laboratory correlation may be helpful spleen is also at the upper limits of normal to slightly enlarged at approximately 12-13 cm. Aortic Atherosclerosis (ICD10-I70.0). Electronically  Signed   By: Donzetta Kohut M.D.   On: 07/28/2019 10:10    Assessment & Plan:  B12 deficiency -     CBC with Differential/Platelet; Future -     Folate; Future  Essential hypertension- He has not achieved his BP goal. He is working on his lifestyle modifications. -     Basic metabolic panel; Future -     Urinalysis, Routine w reflex microscopic; Future  Nonalcoholic steatohepatitis (NASH)- Will start an SGLT-2 inh. -     Hepatic function panel; Future -     Protime-INR; Future  Chronic ITP (idiopathic thrombocytopenia) (HCC) - PLTs are stable. -     CBC with Differential/Platelet; Future -     Folate; Future  Insulin-requiring or dependent type II diabetes mellitus (HCC)- A1C is 8.1%. Will start an SGLT2-inh. -     Basic metabolic panel; Future -     Hemoglobin A1c; Future -     Urinalysis, Routine w reflex microscopic; Future -     Gvoke HypoPen 2-Pack; Inject 1 Act into the skin daily as needed.  Dispense: 2 mL; Refill: 5 -     FreeStyle Libre 2 Reader; 1 Act by Does not apply route daily.  Dispense: 6 each; Refill: 1 -     Empagliflozin; Take 1 tablet (10 mg total) by mouth daily before breakfast.  Dispense: 90 tablet; Refill: 0 -     AMB Referral [facility]BC[facility] Care Management  Need for prophylactic vaccination with combined diphtheria-tetanus-pertussis (DTP) vaccine -     Boostrix; Inject 0.5 mLs into the muscle once for 1 dose.  Dispense: 0.5 mL; Refill: 0     Follow-up: Return in about 6 months (around 04/12/2024).  Sanda Linger, MD

## 2023-10-15 ENCOUNTER — Encounter: Payer: Self-pay | Admitting: Internal Medicine

## 2023-10-15 MED ORDER — GVOKE HYPOPEN 2-PACK 1 MG/0.2ML ~~LOC~~ SOAJ: 1.0000 | Freq: Every day | SUBCUTANEOUS | 5 refills | Status: DC | PRN

## 2023-10-15 MED ORDER — FREESTYLE LIBRE 2 READER DEVI: 1.0000 | Freq: Every day | 1 refills | Status: DC

## 2023-10-15 MED ORDER — EMPAGLIFLOZIN 10 MG PO TABS: 10.0000 mg | ORAL_TABLET | Freq: Every day | ORAL | 0 refills | Status: DC

## 2023-10-18 ENCOUNTER — Other Ambulatory Visit: Payer: Self-pay | Admitting: Internal Medicine

## 2023-10-18 DIAGNOSIS — E785 Hyperlipidemia, unspecified: Secondary | ICD-10-CM

## 2023-10-18 DIAGNOSIS — I2510 Atherosclerotic heart disease of native coronary artery without angina pectoris: Secondary | ICD-10-CM

## 2023-10-19 ENCOUNTER — Other Ambulatory Visit: Payer: Self-pay | Admitting: Internal Medicine

## 2023-10-19 DIAGNOSIS — I2510 Atherosclerotic heart disease of native coronary artery without angina pectoris: Secondary | ICD-10-CM

## 2023-10-19 DIAGNOSIS — I10 Essential (primary) hypertension: Secondary | ICD-10-CM

## 2023-11-04 ENCOUNTER — Other Ambulatory Visit: Payer: Medicare Other

## 2023-11-05 ENCOUNTER — Ambulatory Visit: Payer: Medicare Other

## 2023-11-05 DIAGNOSIS — E118 Type 2 diabetes mellitus with unspecified complications: Secondary | ICD-10-CM

## 2023-11-05 DIAGNOSIS — E538 Deficiency of other specified B group vitamins: Secondary | ICD-10-CM

## 2023-11-05 MED ORDER — CYANOCOBALAMIN 1000 MCG/ML IJ SOLN
1000.0000 ug | Freq: Once | INTRAMUSCULAR | Status: AC
  Administered 2023-11-05: 1000 ug via INTRAMUSCULAR

## 2023-11-05 NOTE — Progress Notes (Signed)
 Patient visits today to receive her B12 injection. Patient was informed and tolerated well. Patient was notified to reach out to [place] if needed.

## 2023-11-10 ENCOUNTER — Other Ambulatory Visit: Payer: Medicare Other | Admitting: Pharmacist

## 2023-11-10 DIAGNOSIS — I10 Essential (primary) hypertension: Secondary | ICD-10-CM

## 2023-11-10 DIAGNOSIS — E119 Type 2 diabetes mellitus without complications: Secondary | ICD-10-CM

## 2023-11-10 MED ORDER — DAPAGLIFLOZIN PROPANEDIOL 10 MG PO TABS: 10.0000 mg | ORAL_TABLET | Freq: Every day | ORAL | Status: DC

## 2023-11-10 NOTE — Patient Instructions (Signed)
 It was a pleasure speaking with you today!  Stop by the office to sign the Farxiga  application this week.  Check blood pressures at home and keep a log. We will review those readings on 11/27/23.  Feel free to call with any questions or concerns!  Darrelyn Drum, PharmD, BCPS, CPP Clinical Pharmacist Practitioner [facility] at [facility] [phone]

## 2023-11-10 NOTE — Progress Notes (Signed)
 11/10/2023 Name: Alex [name] MRN: [id number] DOB: 04/11/1956  Chief Complaint  Patient presents with   Diabetes   Medication Management    Alex [name] is a 68 y.o. year old male who presented for a telephone visit.   They were referred to the pharmacist by their PCP for assistance in managing diabetes.    Jardiance  $399 last week $56,268 Farxiga  10 mg Rec check BP readings at home  Subjective:  Care Team: Primary Care Provider: Joshua Debby CROME, MD ; Next Scheduled Visit: none scheduled   Medication Access/Adherence  Current Pharmacy:  [facility] #[id number] [place], [place] - [address] AT [facility] [address] [place] [address] Phone: [phone] Fax: [phone]  [facility] #[id number] - [place], [place] - [address] AT [facility] OF [address] & [address] [place] [address] Phone: [phone] Fax: [phone]  [facility]ORE #[id number] [place], [place] - [address] AT [facility] OF [facility] & [address] [place] [address] Phone: [phone] Fax: [phone]   Patient reports affordability concerns with their medications: Yes  Patient reports access/transportation concerns to their pharmacy: No  Patient reports adherence concerns with their medications:  No     Diabetes:  Current medications: Soliqua  56 units daily, metformin  XR 750 mg 2 tablets daily. Has not started Farxiga  due to cost - was $399 for 4 months when he checked last week. Last year it was $250 for 30DS.   Hypertension:  Current medications: Irbesartan  300 mg daily, triamterene /hydrochlorothiazide  37.5/25 mg daily  Patient has a validated, automated, upper arm home BP cuff Current blood pressure readings: has not checked   Objective:  BP Readings from Last 3 Encounters:  10/13/23 (!) 152/90  10/03/23 (!) 138/90  05/13/23 132/84     Lab Results  Component Value Date   HGBA1C 8.1  (H) 10/13/2023    Lab Results  Component Value Date   CREATININE 0.85 10/13/2023   BUN 17 10/13/2023   NA 138 10/13/2023   K 3.7 10/13/2023   CL 101 10/13/2023   CO2 28 10/13/2023    Lab Results  Component Value Date   CHOL 144 05/13/2023   HDL 31.90 (L) 05/13/2023   LDLCALC 61 04/09/2022   LDLDIRECT 68.0 05/13/2023   TRIG 321.0 (H) 05/13/2023   CHOLHDL 5 05/13/2023    Medications Reviewed Today     Reviewed by Merceda Lela SAUNDERS, RPH (Pharmacist) on 11/10/23 at 1153  Med List Status: <None>   Medication Order Taking? Sig Documenting Provider Last Dose Status Informant  BD PEN NEEDLE MICRO U/F 32G X 6 MM MISC [id number]  USE DAILY AS DIRECTED Joshua Debby CROME, MD  Active   celecoxib (CELEBREX) 200 MG capsule [id number] Yes Take 200 mg by mouth daily as needed. [provider] Taking Active   Cholecalciferol  (VITAMIN D3) 50 MCG (2000 UT) TABS [id number] Yes TAKE 1 TABLET BY MOUTH DAILY Joshua Debby CROME, MD Taking Active   Continuous Glucose Receiver (FREESTYLE LIBRE 2 READER) DEVI [id number] Yes 1 Act by Does not apply route daily. Joshua Debby CROME, MD Taking Active   Continuous Glucose Sensor (FREESTYLE LIBRE 3 SENSOR) [place] [id number] Yes 1 Act by Does not apply route daily. Place 1 sensor on the skin every 14 days. Use to check glucose continuously Joshua Debby CROME, MD Taking Active   empagliflozin  (JARDIANCE ) 10 MG TABS tablet [id number] No Take 1 tablet (  10 mg total) by mouth daily before breakfast.  Patient not taking: Reported on 11/10/2023   Joshua Debby CROME, MD Not Taking Active   esomeprazole  (NEXIUM ) 40 MG capsule [id number] Yes TAKE 1 CAPSULE BY MOUTH EVERY MORNING BEFORE BREAKFAST Abran Norleen SAILOR, MD Taking Active   fexofenadine (ALLEGRA) 180 MG tablet [id number] Yes Take 180 mg by mouth daily. [provider] Taking Active Self  Glucagon  (GVOKE HYPOPEN  2-PACK) 1 MG/0.2ML SOAJ [id number]  Inject 1 Act into the skin daily as needed. Joshua Debby CROME, MD  Active    Insulin  Glargine-Lixisenatide (SOLIQUA ) 100-33 UNT-MCG/ML SOPN [id number] Yes Inject 60 Units into the skin daily.  Patient taking differently: Inject 56 Units into the skin daily.   Joshua Debby CROME, MD Taking Active   irbesartan  (AVAPRO ) 300 MG tablet [id number] Yes TAKE 1 TABLET(300 MG) BY MOUTH DAILY Joshua Debby CROME, MD Taking Active   metFORMIN  (GLUCOPHAGE -XR) 750 MG 24 hr tablet [id number] Yes TAKE 2 TABLETS(1500 MG) BY MOUTH DAILY WITH OFILIA Joshua Debby CROME, MD Taking Active   potassium chloride  SA (KLOR-CON  M) 20 MEQ tablet [id number] Yes TAKE 1 TABLET BY MOUTH DAILY Joshua Debby CROME, MD Taking Active   rosuvastatin  (CRESTOR ) 10 MG tablet [id number] Yes TAKE 1 TABLET(10 MG) BY MOUTH DAILY Joshua Debby CROME, MD Taking Active   triamterene -hydrochlorothiazide  (DYAZIDE) 37.5-25 MG capsule [id number] Yes TAKE 1 CAPSULE BY MOUTH DAILY Joshua Debby CROME, MD Taking Active               Assessment/Plan:   Diabetes: - Currently uncontrolled, goal A1c <7% - Reviewed SGLT2 inhibitor counseling - Recommend to switch Jardiance  to Farxiga  and apply for PAP  - Meets financial criteria for Farxiga  patient assistance program through [employer]. Will initiate application for patient and PCP to sign.    Hypertension: - Currently uncontrolled - Reviewed long term cardiovascular and renal outcomes of uncontrolled blood pressure - Reviewed appropriate blood pressure monitoring technique and reviewed goal blood pressure. Recommended to check home blood pressure and heart rate daily - Recommend to continue current regimen. Will call back in 2 weeks to review readings.     Follow Up Plan: 1/30  Darrelyn Drum, PharmD, BCPS, CPP Clinical Pharmacist Practitioner [facility] at [facility] [phone]

## 2023-11-13 ENCOUNTER — Encounter: Payer: Self-pay | Admitting: Pharmacist

## 2023-11-13 NOTE — Progress Notes (Signed)
Received pt's signature and PCP signature for Farxiga PAP. Faxed to [employer] and uploaded to patient chart.  Arbutus Leas, PharmD, BCPS, CPP Clinical Pharmacist Practitioner [facility] at [facility] [phone]

## 2023-11-15 ENCOUNTER — Other Ambulatory Visit: Payer: Self-pay | Admitting: Internal Medicine

## 2023-11-15 DIAGNOSIS — I10 Essential (primary) hypertension: Secondary | ICD-10-CM

## 2023-11-15 DIAGNOSIS — E876 Hypokalemia: Secondary | ICD-10-CM

## 2023-11-18 ENCOUNTER — Other Ambulatory Visit: Payer: Self-pay | Admitting: Internal Medicine

## 2023-11-18 DIAGNOSIS — E876 Hypokalemia: Secondary | ICD-10-CM

## 2023-11-18 DIAGNOSIS — I10 Essential (primary) hypertension: Secondary | ICD-10-CM

## 2023-11-19 ENCOUNTER — Encounter: Payer: Self-pay | Admitting: Internal Medicine

## 2023-11-25 ENCOUNTER — Other Ambulatory Visit: Payer: Self-pay | Admitting: Internal Medicine

## 2023-11-25 DIAGNOSIS — E119 Type 2 diabetes mellitus without complications: Secondary | ICD-10-CM

## 2023-11-27 ENCOUNTER — Other Ambulatory Visit: Payer: Medicare Other | Admitting: Pharmacist

## 2023-11-27 VITALS — BP 137/87

## 2023-11-27 DIAGNOSIS — E118 Type 2 diabetes mellitus with unspecified complications: Secondary | ICD-10-CM

## 2023-11-27 DIAGNOSIS — I10 Essential (primary) hypertension: Secondary | ICD-10-CM

## 2023-11-27 NOTE — Progress Notes (Signed)
   11/27/2023  Name: Alex [name] MRN: [id number] DOB: 03-Jan-1956  No chief complaint on file.   Alex [name] is a 68 y.o. year old male who presented for a telephone visit.   They were referred to the pharmacist by their PCP for assistance in managing diabetes.    Subjective: Care Team: Primary Care Provider: Etta Grandchild, MD ; Next Scheduled Visit: none scheduled   Medication Access/Adherence  Current Pharmacy:  [facility] #[id number] [place], [place] - [address] AT [facility] [address] [place] [address] Phone: [phone] Fax: [phone]  [facility]ORE #[id number] [place], [place] - [address] AT [facility] OF [address] & [address] [place] [address] Phone: [phone] Fax: [phone]  [facility] DR[facility] STORE #[id number] [place], [place] - [address] AT [facility] OF [facility] & [address] [place] [address] Phone: [phone] Fax: [phone]   Patient reports affordability concerns with their medications: Yes  Patient reports access/transportation concerns to their pharmacy: No  Patient reports adherence concerns with their medications:  No     Diabetes:  Current medications: Soliqua 56 units daily, metformin XR 750 mg 2 tablets daily. Has not started [place] due to cost - awaiting shipment from PAP   Hypertension:  Current medications: Irbesartan 300 mg daily, triamterene/hydrochlorothiazide 37.5/25 mg daily  Patient has a validated, automated, upper arm home BP cuff Current blood pressure readings: checking BID (mid-morning and evening) 1/15 141/93 1/16 145/83 1/17 140/83 1/18 137/84 1/19 136/80 1/22 122/73 (99) 1/23 131/82 1/24 137/87   Objective:  BP Readings from Last 3 Encounters:  10/13/23 (!) 152/90  10/03/23 (!) 138/90  05/13/23 132/84     Lab Results  Component Value Date   HGBA1C 8.1 (H) 10/13/2023    Lab Results  Component Value Date    CREATININE 0.85 10/13/2023   BUN 17 10/13/2023   NA 138 10/13/2023   K 3.7 10/13/2023   CL 101 10/13/2023   CO2 28 10/13/2023    Lab Results  Component Value Date   CHOL 144 05/13/2023   HDL 31.90 (L) 05/13/2023   LDLCALC 61 04/09/2022   LDLDIRECT 68.0 05/13/2023   TRIG 321.0 (H) 05/13/2023   CHOLHDL 5 05/13/2023    Medications Reviewed Today   Medications were not reviewed in this encounter      Assessment/Plan:  Diabetes: - Currently uncontrolled, goal A1c <7% - Reviewed SGLT2 inhibitor counseling - Waiting for Farxiga from PAP, should receive tomorrow   Hypertension: - Currently uncontrolled. BP goal <130/80 - Reviewed long term cardiovascular and renal outcomes of uncontrolled blood pressure - Reviewed appropriate blood pressure monitoring technique and reviewed goal blood pressure. Recommended to check home blood pressure and heart rate daily - Recommend to continue current regimen. Starting Farxiga may lower BP, will follow up in 3 weeks to check on BP and Farxiga tolerability   Follow Up Plan: 2/19  Arbutus Leas, PharmD, BCPS, CPP Clinical Pharmacist Practitioner [facility] at [facility] [phone]

## 2023-11-27 NOTE — Patient Instructions (Signed)
It was a pleasure speaking with you today!  Start Marcelline Deist when you receive it from the patient assistance program. I will check back in 3 weeks for your blood pressure readings.  Feel free to call with any questions or concerns!  Arbutus Leas, PharmD, BCPS, CPP Clinical Pharmacist Practitioner [place] [facility] at [facility] [phone]

## 2023-12-08 ENCOUNTER — Ambulatory Visit (INDEPENDENT_AMBULATORY_CARE_PROVIDER_SITE_OTHER): Payer: Medicare Other

## 2023-12-08 DIAGNOSIS — E538 Deficiency of other specified B group vitamins: Secondary | ICD-10-CM | POA: Diagnosis not present

## 2023-12-08 MED ORDER — CYANOCOBALAMIN 1000 MCG/ML IJ SOLN
1000.0000 ug | Freq: Once | INTRAMUSCULAR | Status: AC
  Administered 2023-12-08: 1000 ug via INTRAMUSCULAR

## 2023-12-08 NOTE — Progress Notes (Signed)
After obtaining consent, and per orders of Dr. Jones, injection of B12 was given by Hayward Rylander P Lior Hoen. Patient instructed to report any adverse reaction to me immediately.  

## 2023-12-15 ENCOUNTER — Other Ambulatory Visit (HOSPITAL_COMMUNITY): Payer: Self-pay

## 2023-12-15 ENCOUNTER — Telehealth (HOSPITAL_COMMUNITY): Payer: Self-pay | Admitting: Pharmacy Technician

## 2023-12-15 NOTE — Telephone Encounter (Signed)
 Pharmacy Patient Advocate Encounter   Received notification from  [employer]  that prior authorization for SOLIQUA 100-33 UNT-MCG/ML PEN-INJECTORS is required/requested.   Insurance verification completed.   The patient is insured through [employer] .   Per test claim: Refill too soon. PA is not needed at this time. Medication was filled 12/11/2023. Next eligible fill date is 01/02/2024.

## 2023-12-17 ENCOUNTER — Other Ambulatory Visit: Payer: Medicare Other | Admitting: Pharmacist

## 2023-12-17 NOTE — Progress Notes (Signed)
 12/17/2023  Name: Alex [name] MRN: [id number] DOB: 04-28-1956  Chief Complaint  Patient presents with   Diabetes   Medication Management   Hypertension    Alex [name] is a 68 y.o. year old male who presented for a telephone visit.   They were referred to the pharmacist by their PCP for assistance in managing diabetes.    Subjective: Care Team: Primary Care Provider: Etta Grandchild, MD ; Next Scheduled Visit: none scheduled   Medication Access/Adherence  Current Pharmacy:  [facility] #[id number] [place], [place] - [address] AT [facility] [address] [place] [address] Phone: [phone] Fax: [phone]  [facility] #[id number] - [place], [place] - [address] AT [facility] [address] & [address] [place] [address] Phone: [phone] Fax: [phone]  [facility] #[id number] [place], [place] - [address] AT [facility] [address] [place] [address] Phone: [phone] Fax: [phone]   Patient reports affordability concerns with their medications: Yes  Patient reports access/transportation concerns to their pharmacy: No  Patient reports adherence concerns with their medications:  No     Diabetes:  Current medications: Soliqua 56 units daily, metformin XR 750 mg 2 tablets daily, Farxiga 10 mg daily  CGM GMI says 7.5% over last 2 weeks, previously 8.1%   Hypertension:  Current medications: Irbesartan 300 mg daily, triamterene/hydrochlorothiazide 37.5/25 mg daily  Patient has a validated, automated, upper arm home BP cuff Current blood pressure readings: checking BID (mid-morning and evening) 2/7 134/83 2/13 123/80 2/18 126/87 2/19 137/76   Objective:  BP Readings from Last 3 Encounters:  11/27/23 137/87  10/13/23 (!) 152/90  10/03/23 (!) 138/90     Lab Results  Component Value Date   HGBA1C 8.1 (H) 10/13/2023    Lab Results   Component Value Date   CREATININE 0.85 10/13/2023   BUN 17 10/13/2023   NA 138 10/13/2023   K 3.7 10/13/2023   CL 101 10/13/2023   CO2 28 10/13/2023    Lab Results  Component Value Date   CHOL 144 05/13/2023   HDL 31.90 (L) 05/13/2023   LDLCALC 61 04/09/2022   LDLDIRECT 68.0 05/13/2023   TRIG 321.0 (H) 05/13/2023   CHOLHDL 5 05/13/2023    Medications Reviewed Today     Reviewed by Bonita Quin, RPH (Pharmacist) on 12/17/23 at 1213  Med List Status: <None>   Medication Order Taking? Sig Documenting Provider Last Dose Status Informant  BD PEN NEEDLE MICRO U/F 32G X 6 MM MISC [id number]  USE DAILY AS DIRECTED Etta Grandchild, MD  Active   celecoxib (CELEBREX) 200 MG capsule [id number]  Take 200 mg by mouth daily as needed. [provider]  Active   Cholecalciferol (VITAMIN D3) 50 MCG (2000 UT) TABS [id number]  TAKE 1 TABLET BY MOUTH DAILY Etta Grandchild, MD  Active   Continuous Glucose Receiver (FREESTYLE LIBRE 2 READER) DEVI [id number]  1 Act by Does not apply route daily. Etta Grandchild, MD  Active   Continuous Glucose Sensor (FREESTYLE LIBRE 3 SENSOR) [place] [id number]  1 Act by Does not apply route daily. Place 1 sensor on the skin every 14 days. Use to check glucose continuously Etta Grandchild, MD  Active   dapagliflozin propanediol (FARXIGA) 10 MG TABS tablet [id number] Yes Take 1 tablet (10 mg total) by mouth daily before breakfast. Etta Grandchild, MD  Taking Active   esomeprazole (NEXIUM) 40 MG capsule [id number]  TAKE 1 CAPSULE BY MOUTH EVERY MORNING BEFORE BREAKFAST Hilarie Fredrickson, MD  Active   fexofenadine (ALLEGRA) 180 MG tablet [id number]  Take 180 mg by mouth daily. [provider]  Active Self  Glucagon (GVOKE HYPOPEN 2-PACK) 1 MG/0.2ML SOAJ [id number]  Inject 1 Act into the skin daily as needed. Etta Grandchild, MD  Active   irbesartan (AVAPRO) 300 MG tablet [id number]  TAKE 1 TABLET(300 MG) BY MOUTH DAILY Etta Grandchild, MD  Active   metFORMIN  (GLUCOPHAGE-XR) 750 MG 24 hr tablet [id number]  TAKE 2 TABLETS(1500 MG) BY MOUTH DAILY WITH BREAKFAST Etta Grandchild, MD  Active   potassium chloride SA (KLOR-CON M) 20 MEQ tablet [id number] Yes TAKE 1 TABLET BY MOUTH DAILY Etta Grandchild, MD Taking Active   rosuvastatin (CRESTOR) 10 MG tablet [id number]  TAKE 1 TABLET(10 MG) BY MOUTH DAILY Etta Grandchild, MD  Active   SOLIQUA 100-33 UNT-MCG/ML SOPN [id number] Yes ADMINISTER 60 UNITS UNDER THE SKIN DAILY Worthy Rancher B, FNP Taking Active   triamterene-hydrochlorothiazide (DYAZIDE) 37.5-25 MG capsule [id number]  TAKE 1 CAPSULE BY MOUTH DAILY Etta Grandchild, MD  Active              Assessment/Plan:  Diabetes: - Currently uncontrolled, goal A1c <7% - BG have improved since starting [place]. Await next A1c check. Due for annual physical in July   Hypertension: - Currently controlled. BP goal <130/80 - BP have come down to averaging around 130/80 at home - Reviewed long term cardiovascular and renal outcomes of uncontrolled blood pressure - Reviewed appropriate blood pressure monitoring technique and reviewed goal blood pressure. Recommended to check home blood pressure and heart rate daily - Recommend to continue current regimen   Follow Up Plan: PRN  Arbutus Leas, PharmD, BCPS, CPP Clinical Pharmacist Practitioner [facility] [phone]

## 2024-01-03 ENCOUNTER — Other Ambulatory Visit: Payer: Self-pay | Admitting: Internal Medicine

## 2024-01-03 DIAGNOSIS — E119 Type 2 diabetes mellitus without complications: Secondary | ICD-10-CM

## 2024-01-03 DIAGNOSIS — E118 Type 2 diabetes mellitus with unspecified complications: Secondary | ICD-10-CM

## 2024-01-08 ENCOUNTER — Ambulatory Visit (INDEPENDENT_AMBULATORY_CARE_PROVIDER_SITE_OTHER)

## 2024-01-08 ENCOUNTER — Ambulatory Visit: Payer: Medicare Other

## 2024-01-08 DIAGNOSIS — E538 Deficiency of other specified B group vitamins: Secondary | ICD-10-CM

## 2024-01-08 MED ORDER — CYANOCOBALAMIN 1000 MCG/ML IJ SOLN
1000.0000 ug | Freq: Once | INTRAMUSCULAR | Status: AC
  Administered 2024-01-08: 1000 ug via INTRAMUSCULAR

## 2024-01-08 NOTE — Progress Notes (Signed)
Pt here for monthly B12 injection per Dr. Yetta Barre  B12 given IM. and pt tolerated injection well.

## 2024-01-17 ENCOUNTER — Other Ambulatory Visit: Payer: Self-pay | Admitting: Internal Medicine

## 2024-01-17 DIAGNOSIS — E785 Hyperlipidemia, unspecified: Secondary | ICD-10-CM

## 2024-01-17 DIAGNOSIS — I2510 Atherosclerotic heart disease of native coronary artery without angina pectoris: Secondary | ICD-10-CM

## 2024-01-21 ENCOUNTER — Other Ambulatory Visit: Payer: Self-pay | Admitting: Internal Medicine

## 2024-01-21 DIAGNOSIS — I2510 Atherosclerotic heart disease of native coronary artery without angina pectoris: Secondary | ICD-10-CM

## 2024-01-21 DIAGNOSIS — I10 Essential (primary) hypertension: Secondary | ICD-10-CM

## 2024-02-07 ENCOUNTER — Other Ambulatory Visit: Payer: Self-pay | Admitting: Internal Medicine

## 2024-02-07 DIAGNOSIS — E119 Type 2 diabetes mellitus without complications: Secondary | ICD-10-CM

## 2024-02-08 ENCOUNTER — Other Ambulatory Visit: Payer: Self-pay | Admitting: Internal Medicine

## 2024-02-08 DIAGNOSIS — E876 Hypokalemia: Secondary | ICD-10-CM

## 2024-02-08 DIAGNOSIS — I10 Essential (primary) hypertension: Secondary | ICD-10-CM

## 2024-02-09 ENCOUNTER — Ambulatory Visit (INDEPENDENT_AMBULATORY_CARE_PROVIDER_SITE_OTHER)

## 2024-02-09 DIAGNOSIS — E538 Deficiency of other specified B group vitamins: Secondary | ICD-10-CM | POA: Diagnosis not present

## 2024-02-09 MED ORDER — CYANOCOBALAMIN 1000 MCG/ML IJ SOLN
1000.0000 ug | Freq: Once | INTRAMUSCULAR | Status: AC
  Administered 2024-02-09: 1000 ug via INTRAMUSCULAR

## 2024-02-09 NOTE — Progress Notes (Signed)
Patient here for monthly B12 injection per Dr. Jones.  B12 1000 mcg given in left IM and patient tolerated injection well today.  

## 2024-02-10 ENCOUNTER — Encounter: Payer: Self-pay | Admitting: Internal Medicine

## 2024-02-10 NOTE — Telephone Encounter (Signed)
 Patient medication was sent to his local pharmacy this morning. I called the patient and left a very detailed message.

## 2024-02-10 NOTE — Telephone Encounter (Signed)
 Copied from CRM [id number]. Topic: Clinical - Medication Question >> Feb 09, 2024  4:50 PM Alex [name] wrote: Reason for CRM: Patient calling to check the status of his Continuous Glucose Sensor that he submitted a request for on Saturday. I did let him know that it takes up to 3 business days for refill to be ready for pick up. Patient stated that he is going out of town Wednesday morning and needs it before then.

## 2024-03-11 ENCOUNTER — Ambulatory Visit

## 2024-03-11 DIAGNOSIS — D225 Melanocytic nevi of trunk: Secondary | ICD-10-CM | POA: Diagnosis not present

## 2024-03-11 DIAGNOSIS — E538 Deficiency of other specified B group vitamins: Secondary | ICD-10-CM

## 2024-03-11 DIAGNOSIS — L814 Other melanin hyperpigmentation: Secondary | ICD-10-CM | POA: Diagnosis not present

## 2024-03-11 DIAGNOSIS — L570 Actinic keratosis: Secondary | ICD-10-CM | POA: Diagnosis not present

## 2024-03-11 DIAGNOSIS — L918 Other hypertrophic disorders of the skin: Secondary | ICD-10-CM | POA: Diagnosis not present

## 2024-03-11 DIAGNOSIS — L821 Other seborrheic keratosis: Secondary | ICD-10-CM | POA: Diagnosis not present

## 2024-03-11 MED ORDER — CYANOCOBALAMIN 1000 MCG/ML IJ SOLN
1000.0000 ug | Freq: Once | INTRAMUSCULAR | Status: AC
  Administered 2024-03-11: 1000 ug via INTRAMUSCULAR

## 2024-03-11 NOTE — Progress Notes (Signed)
Pt was given B12 injection with no complications.

## 2024-03-31 ENCOUNTER — Other Ambulatory Visit: Payer: Self-pay | Admitting: Internal Medicine

## 2024-03-31 DIAGNOSIS — E118 Type 2 diabetes mellitus with unspecified complications: Secondary | ICD-10-CM

## 2024-03-31 DIAGNOSIS — E119 Type 2 diabetes mellitus without complications: Secondary | ICD-10-CM

## 2024-04-08 ENCOUNTER — Other Ambulatory Visit (HOSPITAL_COMMUNITY): Payer: Self-pay

## 2024-04-12 ENCOUNTER — Ambulatory Visit (INDEPENDENT_AMBULATORY_CARE_PROVIDER_SITE_OTHER)

## 2024-04-12 ENCOUNTER — Ambulatory Visit (INDEPENDENT_AMBULATORY_CARE_PROVIDER_SITE_OTHER): Admitting: Family Medicine

## 2024-04-12 ENCOUNTER — Encounter: Payer: Self-pay | Admitting: Family Medicine

## 2024-04-12 VITALS — BP 144/76 | HR 80 | Temp 98.1°F | Resp 18 | Ht 70.0 in | Wt 232.0 lb

## 2024-04-12 DIAGNOSIS — E538 Deficiency of other specified B group vitamins: Secondary | ICD-10-CM | POA: Diagnosis not present

## 2024-04-12 DIAGNOSIS — H00011 Hordeolum externum right upper eyelid: Secondary | ICD-10-CM

## 2024-04-12 MED ORDER — CYANOCOBALAMIN 1000 MCG/ML IJ SOLN
100.0000 ug | Freq: Once | INTRAMUSCULAR | Status: AC
  Administered 2024-04-12: 100 ug via INTRAMUSCULAR

## 2024-04-12 NOTE — Progress Notes (Signed)
 Assessment & Plan:  1. Hordeolum externum of right upper eyelid (Primary) Education provided on styes. Encouraged warm compresses rice sock and cleansing with Johnson's Baby Soap twice daily.    Follow up plan: Return if symptoms worsen or fail to improve.  Hershel Los, MSN, APRN, FNP-C  Subjective:  HPI: Alex [name] is a 68 y.o. male presenting on 04/12/2024 for Eye Problem (Stye on right eye - noticed yesterday morning, today seems worse. /Tried warm compresses. )  Patient reports a stye on his right eye that first started feeling uncomfortable Saturday night. This morning was worse. He has been applying a warm washcloth.     ROS: Negative unless specifically indicated above in HPI.   Relevant past medical history reviewed and updated as indicated.   Allergies and medications reviewed and updated.   Current Outpatient Medications:    BD PEN NEEDLE MICRO U/F 32G X 6 MM MISC, USE DAILY AS DIRECTED, Disp: 100 each, Rfl: 1   celecoxib (CELEBREX) 200 MG capsule, Take 200 mg by mouth daily as needed., Disp: , Rfl:    Cholecalciferol  (VITAMIN D3) 50 MCG (2000 UT) TABS, TAKE 1 TABLET BY MOUTH DAILY, Disp: 90 tablet, Rfl: 1   Continuous Glucose Receiver (FREESTYLE LIBRE 2 READER) DEVI, 1 Act by Does not apply route daily., Disp: 6 each, Rfl: 1   Continuous Glucose Sensor (FREESTYLE LIBRE 3 PLUS SENSOR) MISC, APPLY 1 SENSOR TO SKIN EVERY 15 DAYS, Disp: 2 each, Rfl: 5   dapagliflozin  propanediol (FARXIGA ) 10 MG TABS tablet, Take 1 tablet (10 mg total) by mouth daily before breakfast., Disp: , Rfl:    esomeprazole  (NEXIUM ) 40 MG capsule, TAKE 1 CAPSULE BY MOUTH EVERY MORNING BEFORE BREAKFAST, Disp: 30 capsule, Rfl: 5   fexofenadine (ALLEGRA) 180 MG tablet, Take 180 mg by mouth daily., Disp: , Rfl:    irbesartan  (AVAPRO ) 300 MG tablet, TAKE 1 TABLET(300 MG) BY MOUTH DAILY, Disp: 90 tablet, Rfl: 0   metFORMIN  (GLUCOPHAGE -XR) 750 MG 24 hr tablet, TAKE 2 TABLETS(1500 MG) BY MOUTH  DAILY WITH BREAKFAST, Disp: 180 tablet, Rfl: 0   potassium chloride  SA (KLOR-CON  M) 20 MEQ tablet, TAKE 1 TABLET BY MOUTH DAILY, Disp: 90 tablet, Rfl: 1   rosuvastatin  (CRESTOR ) 10 MG tablet, TAKE 1 TABLET(10 MG) BY MOUTH DAILY, Disp: 90 tablet, Rfl: 0   SOLIQUA  100-33 UNT-MCG/ML SOPN, ADMINISTER 60 UNITS UNDER THE SKIN DAILY, Disp: 54 mL, Rfl: 1   triamterene -hydrochlorothiazide  (DYAZIDE) 37.5-25 MG capsule, TAKE 1 CAPSULE BY MOUTH DAILY, Disp: 90 capsule, Rfl: 0   Glucagon  (GVOKE HYPOPEN  2-PACK) 1 MG/0.2ML SOAJ, Inject 1 Act into the skin daily as needed. (Patient not taking: Reported on 04/12/2024), Disp: 2 mL, Rfl: 5  Allergies  Allergen Reactions   Diltiazem  Other (See Comments)    Leg edema   Spironolactone  Other (See Comments)    gynecomastia    Objective:   BP (!) 144/76   Pulse 80   Temp 98.1 F (36.7 C)   Resp 18   Ht 5' 10 (1.778 m)   Wt 232 lb (105.2 kg)   BMI 33.29 kg/m    Physical Exam Vitals reviewed.  Constitutional:      General: He is not in acute distress.    Appearance: Normal appearance. He is not ill-appearing, toxic-appearing or diaphoretic.  HENT:     Head: Normocephalic and atraumatic.   Eyes:     General: No scleral icterus.       Right eye: Hordeolum (upper eyelid)  present. No discharge.        Left eye: No discharge.     Conjunctiva/sclera: Conjunctivae normal.     Right eye: Right conjunctiva is not injected. No exudate or hemorrhage.    Left eye: Left conjunctiva is not injected. No exudate or hemorrhage.   Cardiovascular:     Rate and Rhythm: Normal rate.  Pulmonary:     Effort: Pulmonary effort is normal. No respiratory distress.   Musculoskeletal:        General: Normal range of motion.     Cervical back: Normal range of motion.   Skin:    General: Skin is warm and dry.   Neurological:     Mental Status: He is alert and oriented to person, place, and time. Mental status is at baseline.   Psychiatric:        Mood and Affect:  Mood normal.        Behavior: Behavior normal.        Thought Content: Thought content normal.        Judgment: Judgment normal.

## 2024-04-12 NOTE — Patient Instructions (Signed)
 Warm compresses with rice sock.  Johnson baby soap - wash twice per day.

## 2024-04-12 NOTE — Progress Notes (Signed)
 Pt here for monthly B12 injection per Dr.Jones  B12 1000mcg given IM and pt tolerated injection well.  Next B12 injection scheduled for 07/17

## 2024-04-15 ENCOUNTER — Other Ambulatory Visit: Payer: Self-pay | Admitting: Internal Medicine

## 2024-04-15 DIAGNOSIS — E785 Hyperlipidemia, unspecified: Secondary | ICD-10-CM

## 2024-04-15 DIAGNOSIS — I2510 Atherosclerotic heart disease of native coronary artery without angina pectoris: Secondary | ICD-10-CM

## 2024-04-21 ENCOUNTER — Other Ambulatory Visit: Payer: Self-pay | Admitting: Internal Medicine

## 2024-04-21 DIAGNOSIS — I2510 Atherosclerotic heart disease of native coronary artery without angina pectoris: Secondary | ICD-10-CM

## 2024-04-21 DIAGNOSIS — I10 Essential (primary) hypertension: Secondary | ICD-10-CM

## 2024-05-05 ENCOUNTER — Other Ambulatory Visit: Payer: Self-pay | Admitting: Internal Medicine

## 2024-05-05 DIAGNOSIS — I10 Essential (primary) hypertension: Secondary | ICD-10-CM

## 2024-05-05 DIAGNOSIS — E876 Hypokalemia: Secondary | ICD-10-CM

## 2024-05-13 ENCOUNTER — Ambulatory Visit (INDEPENDENT_AMBULATORY_CARE_PROVIDER_SITE_OTHER)

## 2024-05-13 ENCOUNTER — Encounter: Admitting: Internal Medicine

## 2024-05-13 DIAGNOSIS — E538 Deficiency of other specified B group vitamins: Secondary | ICD-10-CM

## 2024-05-13 MED ORDER — CYANOCOBALAMIN 1000 MCG/ML IJ SOLN
1000.0000 ug | Freq: Once | INTRAMUSCULAR | Status: AC
  Administered 2024-05-13: 1000 ug via INTRAMUSCULAR

## 2024-05-13 NOTE — Progress Notes (Signed)
Patient here for monthly B12 injection per Dr. Jones.  B12 1000 mcg given in left IM and patient tolerated injection well today.  

## 2024-05-19 ENCOUNTER — Other Ambulatory Visit: Payer: Self-pay | Admitting: Internal Medicine

## 2024-05-19 DIAGNOSIS — E876 Hypokalemia: Secondary | ICD-10-CM

## 2024-05-19 DIAGNOSIS — I10 Essential (primary) hypertension: Secondary | ICD-10-CM

## 2024-05-21 ENCOUNTER — Other Ambulatory Visit: Payer: Self-pay | Admitting: Internal Medicine

## 2024-05-21 DIAGNOSIS — H5203 Hypermetropia, bilateral: Secondary | ICD-10-CM | POA: Diagnosis not present

## 2024-05-21 DIAGNOSIS — I2510 Atherosclerotic heart disease of native coronary artery without angina pectoris: Secondary | ICD-10-CM

## 2024-05-21 DIAGNOSIS — E785 Hyperlipidemia, unspecified: Secondary | ICD-10-CM

## 2024-05-21 DIAGNOSIS — E119 Type 2 diabetes mellitus without complications: Secondary | ICD-10-CM | POA: Diagnosis not present

## 2024-05-21 LAB — HM DIABETES EYE EXAM

## 2024-05-24 ENCOUNTER — Encounter: Payer: Self-pay | Admitting: Internal Medicine

## 2024-05-24 ENCOUNTER — Other Ambulatory Visit: Payer: Self-pay | Admitting: Family

## 2024-05-24 DIAGNOSIS — Z794 Long term (current) use of insulin: Secondary | ICD-10-CM

## 2024-05-25 ENCOUNTER — Ambulatory Visit (INDEPENDENT_AMBULATORY_CARE_PROVIDER_SITE_OTHER)

## 2024-05-25 ENCOUNTER — Ambulatory Visit: Payer: Self-pay | Admitting: Internal Medicine

## 2024-05-25 ENCOUNTER — Ambulatory Visit (INDEPENDENT_AMBULATORY_CARE_PROVIDER_SITE_OTHER): Admitting: Internal Medicine

## 2024-05-25 ENCOUNTER — Encounter: Payer: Self-pay | Admitting: Internal Medicine

## 2024-05-25 VITALS — BP 142/86 | HR 80 | Temp 98.2°F | Resp 16 | Ht 70.0 in | Wt 230.2 lb

## 2024-05-25 DIAGNOSIS — R351 Nocturia: Secondary | ICD-10-CM

## 2024-05-25 DIAGNOSIS — E118 Type 2 diabetes mellitus with unspecified complications: Secondary | ICD-10-CM

## 2024-05-25 DIAGNOSIS — I10 Essential (primary) hypertension: Secondary | ICD-10-CM

## 2024-05-25 DIAGNOSIS — Z0001 Encounter for general adult medical examination with abnormal findings: Secondary | ICD-10-CM

## 2024-05-25 DIAGNOSIS — E876 Hypokalemia: Secondary | ICD-10-CM

## 2024-05-25 DIAGNOSIS — N401 Enlarged prostate with lower urinary tract symptoms: Secondary | ICD-10-CM

## 2024-05-25 DIAGNOSIS — Z794 Long term (current) use of insulin: Secondary | ICD-10-CM

## 2024-05-25 DIAGNOSIS — E538 Deficiency of other specified B group vitamins: Secondary | ICD-10-CM | POA: Diagnosis not present

## 2024-05-25 DIAGNOSIS — R9389 Abnormal findings on diagnostic imaging of other specified body structures: Secondary | ICD-10-CM | POA: Diagnosis not present

## 2024-05-25 DIAGNOSIS — I2584 Coronary atherosclerosis due to calcified coronary lesion: Secondary | ICD-10-CM

## 2024-05-25 DIAGNOSIS — D693 Immune thrombocytopenic purpura: Secondary | ICD-10-CM

## 2024-05-25 DIAGNOSIS — R0989 Other specified symptoms and signs involving the circulatory and respiratory systems: Secondary | ICD-10-CM | POA: Diagnosis not present

## 2024-05-25 DIAGNOSIS — R053 Chronic cough: Secondary | ICD-10-CM | POA: Diagnosis not present

## 2024-05-25 DIAGNOSIS — I2510 Atherosclerotic heart disease of native coronary artery without angina pectoris: Secondary | ICD-10-CM

## 2024-05-25 DIAGNOSIS — K7581 Nonalcoholic steatohepatitis (NASH): Secondary | ICD-10-CM

## 2024-05-25 DIAGNOSIS — E785 Hyperlipidemia, unspecified: Secondary | ICD-10-CM

## 2024-05-25 DIAGNOSIS — K2270 Barrett's esophagus without dysplasia: Secondary | ICD-10-CM

## 2024-05-25 DIAGNOSIS — Z23 Encounter for immunization: Secondary | ICD-10-CM | POA: Insufficient documentation

## 2024-05-25 DIAGNOSIS — R059 Cough, unspecified: Secondary | ICD-10-CM | POA: Diagnosis not present

## 2024-05-25 DIAGNOSIS — E119 Type 2 diabetes mellitus without complications: Secondary | ICD-10-CM | POA: Diagnosis not present

## 2024-05-25 LAB — MICROALBUMIN / CREATININE URINE RATIO
Creatinine,U: 75.8 mg/dL
Microalb Creat Ratio: 24.2 mg/g (ref 0.0–30.0)
Microalb, Ur: 1.8 mg/dL (ref 0.0–1.9)

## 2024-05-25 LAB — LIPID PANEL
Cholesterol: 135 mg/dL (ref 0–200)
HDL: 31.9 mg/dL — ABNORMAL LOW (ref >39.00)
LDL Cholesterol: 68 mg/dL (ref 0–99)
NonHDL: 102.73
Total CHOL/HDL Ratio: 4
Triglycerides: 174 mg/dL — ABNORMAL HIGH (ref 0.0–149.0)
VLDL: 34.8 mg/dL (ref 0.0–40.0)

## 2024-05-25 LAB — CBC WITH DIFFERENTIAL/PLATELET
Basophils Absolute: 0 K/uL (ref 0.0–0.1)
Basophils Relative: 1.1 % (ref 0.0–3.0)
Eosinophils Absolute: 0.1 K/uL (ref 0.0–0.7)
Eosinophils Relative: 2.1 % (ref 0.0–5.0)
HCT: 48.6 % (ref 39.0–52.0)
Hemoglobin: 16.7 g/dL (ref 13.0–17.0)
Lymphocytes Relative: 28.2 % (ref 12.0–46.0)
Lymphs Abs: 1.1 K/uL (ref 0.7–4.0)
MCHC: 34.3 g/dL (ref 30.0–36.0)
MCV: 92.4 fl (ref 78.0–100.0)
Monocytes Absolute: 0.4 K/uL (ref 0.1–1.0)
Monocytes Relative: 11.2 % (ref 3.0–12.0)
Neutro Abs: 2.1 K/uL (ref 1.4–7.7)
Neutrophils Relative %: 57.4 % (ref 43.0–77.0)
Platelets: 116 K/uL — ABNORMAL LOW (ref 150.0–400.0)
RBC: 5.26 Mil/uL (ref 4.22–5.81)
RDW: 13.6 % (ref 11.5–15.5)
WBC: 3.7 K/uL — ABNORMAL LOW (ref 4.0–10.5)

## 2024-05-25 LAB — URINALYSIS, ROUTINE W REFLEX MICROSCOPIC
Bilirubin Urine: NEGATIVE
Hgb urine dipstick: NEGATIVE
Ketones, ur: NEGATIVE
Leukocytes,Ua: NEGATIVE
Nitrite: NEGATIVE
RBC / HPF: NONE SEEN (ref 0–?)
Specific Gravity, Urine: 1.02 (ref 1.000–1.030)
Total Protein, Urine: NEGATIVE
Urine Glucose: >=1000 — AB
Urobilinogen, UA: 0.2 (ref 0.0–1.0)
WBC, UA: NONE SEEN (ref 0–?)
pH: 6.5 (ref 5.0–8.0)

## 2024-05-25 LAB — HEMOGLOBIN A1C: Hgb A1c MFr Bld: 7.5 % — ABNORMAL HIGH (ref 4.6–6.5)

## 2024-05-25 LAB — BASIC METABOLIC PANEL WITH GFR
BUN: 15 mg/dL (ref 6–23)
CO2: 26 meq/L (ref 19–32)
Calcium: 9.3 mg/dL (ref 8.4–10.5)
Chloride: 100 meq/L (ref 96–112)
Creatinine, Ser: 0.8 mg/dL (ref 0.40–1.50)
GFR: 91.89 mL/min (ref >60.00)
Glucose, Bld: 91 mg/dL (ref 70–99)
Potassium: 3.5 meq/L (ref 3.5–5.1)
Sodium: 138 meq/L (ref 135–145)

## 2024-05-25 LAB — PSA: PSA: 0.86 ng/mL (ref 0.10–4.00)

## 2024-05-25 LAB — HEPATIC FUNCTION PANEL
ALT: 45 U/L (ref 0–53)
AST: 28 U/L (ref 0–37)
Albumin: 4.7 g/dL (ref 3.5–5.2)
Alkaline Phosphatase: 41 U/L (ref 39–117)
Bilirubin, Direct: 0.2 mg/dL (ref 0.0–0.3)
Total Bilirubin: 1.2 mg/dL (ref 0.2–1.2)
Total Protein: 7.1 g/dL (ref 6.0–8.3)

## 2024-05-25 LAB — TSH: TSH: 2.87 u[IU]/mL (ref 0.35–5.50)

## 2024-05-25 MED ORDER — ROSUVASTATIN CALCIUM 10 MG PO TABS: 10.0000 mg | ORAL_TABLET | Freq: Every day | ORAL | 1 refills | Status: DC

## 2024-05-25 MED ORDER — SHINGRIX 50 MCG/0.5ML IM SUSR
0.5000 mL | Freq: Once | INTRAMUSCULAR | 1 refills | Status: AC
Start: 2024-05-25 — End: 2024-05-25

## 2024-05-25 MED ORDER — METFORMIN HCL ER 750 MG PO TB24: 750.0000 mg | ORAL_TABLET | Freq: Every day | ORAL | 1 refills | Status: DC

## 2024-05-25 MED ORDER — SOLIQUA 100-33 UNT-MCG/ML ~~LOC~~ SOPN: 60.0000 [IU] | PEN_INJECTOR | Freq: Every day | SUBCUTANEOUS | 1 refills | Status: DC

## 2024-05-25 MED ORDER — TRIAMTERENE-HCTZ 37.5-25 MG PO CAPS
1.0000 | ORAL_CAPSULE | Freq: Every day | ORAL | 1 refills | Status: DC
Start: 2024-05-25 — End: 2024-08-23

## 2024-05-25 MED ORDER — FARXIGA 10 MG PO TABS: 10.0000 mg | ORAL_TABLET | Freq: Every day | ORAL | 1 refills | Status: DC

## 2024-05-25 NOTE — Patient Instructions (Signed)
 Health Maintenance, Male  Adopting a healthy lifestyle and getting preventive care are important in promoting health and wellness. Ask your health care provider about:  The right schedule for you to have regular tests and exams.  Things you can do on your own to prevent diseases and keep yourself healthy.  What should I know about diet, weight, and exercise?  Eat a healthy diet    Eat a diet that includes plenty of vegetables, fruits, low-fat dairy products, and lean protein.  Do not eat a lot of foods that are high in solid fats, added sugars, or sodium.  Maintain a healthy weight  Body mass index (BMI) is a measurement that can be used to identify possible weight problems. It estimates body fat based on height and weight. Your health care provider can help determine your BMI and help you achieve or maintain a healthy weight.  Get regular exercise  Get regular exercise. This is one of the most important things you can do for your health. Most adults should:  Exercise for at least 150 minutes each week. The exercise should increase your heart rate and make you sweat (moderate-intensity exercise).  Do strengthening exercises at least twice a week. This is in addition to the moderate-intensity exercise.  Spend less time sitting. Even light physical activity can be beneficial.  Watch cholesterol and blood lipids  Have your blood tested for lipids and cholesterol at 67 years of age, then have this test every 5 years.  You may need to have your cholesterol levels checked more often if:  Your lipid or cholesterol levels are high.  You are older than 67 years of age.  You are at high risk for heart disease.  What should I know about cancer screening?  Many types of cancers can be detected early and may often be prevented. Depending on your health history and family history, you may need to have cancer screening at various ages. This may include screening for:  Colorectal cancer.  Prostate cancer.  Skin cancer.  Lung  cancer.  What should I know about heart disease, diabetes, and high blood pressure?  Blood pressure and heart disease  High blood pressure causes heart disease and increases the risk of stroke. This is more likely to develop in people who have high blood pressure readings or are overweight.  Talk with your health care provider about your target blood pressure readings.  Have your blood pressure checked:  Every 3-5 years if you are 9-95 years of age.  Every year if you are 85 years old or older.  If you are between the ages of 29 and 29 and are a current or former smoker, ask your health care provider if you should have a one-time screening for abdominal aortic aneurysm (AAA).  Diabetes  Have regular diabetes screenings. This checks your fasting blood sugar level. Have the screening done:  Once every three years after age 23 if you are at a normal weight and have a low risk for diabetes.  More often and at a younger age if you are overweight or have a high risk for diabetes.  What should I know about preventing infection?  Hepatitis B  If you have a higher risk for hepatitis B, you should be screened for this virus. Talk with your health care provider to find out if you are at risk for hepatitis B infection.  Hepatitis C  Blood testing is recommended for:  Everyone born from [birth year] through 1964.  Anyone  with known risk factors for hepatitis C.  Sexually transmitted infections (STIs)  You should be screened each year for STIs, including gonorrhea and chlamydia, if:  You are sexually active and are younger than 67 years of age.  You are older than 67 years of age and your health care provider tells you that you are at risk for this type of infection.  Your sexual activity has changed since you were last screened, and you are at increased risk for chlamydia or gonorrhea. Ask your health care provider if you are at risk.  Ask your health care provider about whether you are at high risk for HIV. Your health care provider  may recommend a prescription medicine to help prevent HIV infection. If you choose to take medicine to prevent HIV, you should first get tested for HIV. You should then be tested every 3 months for as long as you are taking the medicine.  Follow these instructions at home:  Alcohol use  Do not drink alcohol if your health care provider tells you not to drink.  If you drink alcohol:  Limit how much you have to 0-2 drinks a day.  Know how much alcohol is in your drink. In the [place] one drink equals one 12 oz bottle of beer (355 mL), one 5 oz glass of wine (148 mL), or one 1 oz glass of hard liquor (44 mL).  Lifestyle  Do not use any products that contain nicotine or tobacco. These products include cigarettes, chewing tobacco, and vaping devices, such as e-cigarettes. If you need help quitting, ask your health care provider.  Do not use street drugs.  Do not share needles.  Ask your health care provider for help if you need support or information about quitting drugs.  General instructions  Schedule regular health, dental, and eye exams.  Stay current with your vaccines.  Tell your health care provider if:  You often feel depressed.  You have ever been abused or do not feel safe at home.  Summary  Adopting a healthy lifestyle and getting preventive care are important in promoting health and wellness.  Follow your health care provider's instructions about healthy diet, exercising, and getting tested or screened for diseases.  Follow your health care provider's instructions on monitoring your cholesterol and blood pressure.  This information is not intended to replace advice given to you by your health care provider. Make sure you discuss any questions you have with your health care provider.  Document Revised: 03/05/2021 Document Reviewed: 03/05/2021  Elsevier Patient Education  2024 [employer].

## 2024-05-25 NOTE — Progress Notes (Signed)
 Subjective:  Patient ID: Alex [name], male    DOB: 27-Oct-1956  Age: 68 y.o. MRN: [id number]  CC: Annual Exam, Hypertension, Diabetes, and Hyperlipidemia   HPI Alex [name] presents for a CPX and f/up ---  Discussed the use of AI scribe software for clinical note transcription with the patient, who gave verbal consent to proceed.  History of Present Illness Alex [name] is a 68 year old male with diabetes and Barrett's esophagus who presents for a routine follow-up.  He maintains an active lifestyle, going to the gym twice a week, walking on the treadmill, and doing upper body exercises. No chest pain, shortness of breath, dizziness, or lightheadedness during these activities.  He monitors his blood sugar using a CGM linked to his phone, with an average blood sugar of 7.2% over the past week and 7.3% over the past 90 days. He notes a slight increase in his A1c due to family activities and dietary temptations in June. He is currently on Farxiga  10 mg, insulin  56 units, and metformin  750 mg twice a day. His A1c was over 8% at his last visit, and the addition of Farxiga  has improved his control.  He had an eye exam last Friday with no changes in his eyesight. He received a tetanus shot one year ago.  He mentions a mild, occasional cough, usually just one cough to clear his throat, and wonders if it could be related to his Barrett's esophagus, diagnosed 30 years ago. He has not had an endoscopy in about five years, with the last one showing no issues. No trouble swallowing, painful swallowing, weight loss, or producing blood or phlegm with the cough. He takes generic esomeprazole  40 mg daily for heartburn.  His sister has a similar cough.    Outpatient Medications Prior to Visit  Medication Sig Dispense Refill   BD PEN NEEDLE MICRO U/F 32G X 6 MM MISC USE DAILY AS DIRECTED 100 each 1   celecoxib (CELEBREX) 200 MG capsule Take 200 mg by mouth daily as needed.      Cholecalciferol  (VITAMIN D3) 50 MCG (2000 UT) TABS TAKE 1 TABLET BY MOUTH DAILY 90 tablet 1   Continuous Glucose Receiver (FREESTYLE LIBRE 2 READER) DEVI 1 Act by Does not apply route daily. 6 each 1   Continuous Glucose Sensor (FREESTYLE LIBRE 3 PLUS SENSOR) MISC APPLY 1 SENSOR TO SKIN EVERY 15 DAYS 2 each 5   esomeprazole  (NEXIUM ) 40 MG capsule TAKE 1 CAPSULE BY MOUTH EVERY MORNING BEFORE BREAKFAST 30 capsule 5   fexofenadine (ALLEGRA) 180 MG tablet Take 180 mg by mouth daily.     irbesartan  (AVAPRO ) 300 MG tablet TAKE 1 TABLET(300 MG) BY MOUTH DAILY 90 tablet 0   potassium chloride  SA (KLOR-CON  M) 20 MEQ tablet TAKE 1 TABLET BY MOUTH DAILY 90 tablet 1   dapagliflozin  propanediol (FARXIGA ) 10 MG TABS tablet Take 1 tablet (10 mg total) by mouth daily before breakfast.     metFORMIN  (GLUCOPHAGE -XR) 750 MG 24 hr tablet TAKE 2 TABLETS(1500 MG) BY MOUTH DAILY WITH BREAKFAST 180 tablet 0   rosuvastatin  (CRESTOR ) 10 MG tablet TAKE 1 TABLET(10 MG) BY MOUTH DAILY. MUST SEE DR.Anais Denslow FOR REFILLS 30 tablet 0   SOLIQUA  100-33 UNT-MCG/ML SOPN ADMINISTER 60 UNITS UNDER THE SKIN DAILY 54 mL 1   triamterene -hydrochlorothiazide  (DYAZIDE) 37.5-25 MG capsule Take 1 each (1 capsule total) by mouth daily. Needs office visit with Dr. Joshua 30 capsule 0   Glucagon  (GVOKE HYPOPEN  2-PACK) 1  MG/0.2ML SOAJ Inject 1 Act into the skin daily as needed. (Patient not taking: Reported on 05/25/2024) 2 mL 5   No facility-administered medications prior to visit.    ROS Review of Systems  Constitutional:  Negative for appetite change, chills, diaphoresis, fatigue and fever.  HENT: Negative.  Negative for trouble swallowing and voice change.   Eyes: Negative.  Negative for visual disturbance.  Respiratory:  Positive for cough. Negative for chest tightness, shortness of breath and wheezing.   Cardiovascular:  Negative for chest pain, palpitations and leg swelling.  Gastrointestinal:  Negative for abdominal pain, blood in stool,  constipation, diarrhea, nausea and vomiting.  Endocrine: Negative.   Genitourinary: Negative.  Negative for difficulty urinating.       ++nocturia  Musculoskeletal: Negative.  Negative for arthralgias.  Skin: Negative.  Negative for color change.  Neurological:  Negative for dizziness, seizures, weakness, light-headedness and headaches.  Hematological:  Negative for adenopathy. Does not bruise/bleed easily.  Psychiatric/Behavioral: Negative.      Objective:  BP (!) 142/86 (BP Location: Left Arm, Patient Position: Sitting, Cuff Size: Large)   Pulse 80   Temp 98.2 F (36.8 C) (Oral)   Resp 16   Ht 5' 10 (1.778 m)   Wt 230 lb 3.2 oz (104.4 kg)   SpO2 94%   BMI 33.03 kg/m   BP Readings from Last 3 Encounters:  05/25/24 (!) 142/86  04/12/24 (!) 144/76  11/27/23 137/87    Wt Readings from Last 3 Encounters:  05/25/24 230 lb 3.2 oz (104.4 kg)  04/12/24 232 lb (105.2 kg)  10/13/23 233 lb 9.6 oz (106 kg)    Physical Exam Vitals reviewed.  Constitutional:      Appearance: Normal appearance.  HENT:     Nose: Nose normal.     Mouth/Throat:     Mouth: Mucous membranes are moist.  Eyes:     General: No scleral icterus.    Conjunctiva/sclera: Conjunctivae normal.  Cardiovascular:     Rate and Rhythm: Normal rate and regular rhythm.     Heart sounds: Normal heart sounds, S1 normal and S2 normal. No murmur heard.    No friction rub. No gallop.     Comments: EKG- NSR, 74 bpm Inferior infarct pattern is not new No LVH or acute ST/T wave changes Pulmonary:     Effort: Pulmonary effort is normal.     Breath sounds: No stridor. No wheezing, rhonchi or rales.  Abdominal:     General: Abdomen is protuberant. Bowel sounds are normal. There is no distension.     Palpations: Abdomen is soft. There is no hepatomegaly, splenomegaly or mass.     Tenderness: There is no abdominal tenderness. There is no guarding.     Hernia: No hernia is present. There is no hernia in the left inguinal  area or right inguinal area.  Genitourinary:    Pubic Area: No rash.      Penis: Normal and circumcised.      Testes: Normal.     Epididymis:     Right: Normal.     Left: Normal.     Prostate: Enlarged. Not tender and no nodules present.     Rectum: Guaiac result negative. External hemorrhoid present. No mass, tenderness, anal fissure or internal hemorrhoid. Normal anal tone.  Musculoskeletal:     Cervical back: Neck supple.     Right lower leg: No edema.     Left lower leg: No edema.  Lymphadenopathy:     Lower  Body: No right inguinal adenopathy. No left inguinal adenopathy.  Skin:    Findings: No bruising, lesion or rash.  Neurological:     General: No focal deficit present.     Mental Status: He is alert.  Psychiatric:        Mood and Affect: Mood normal.        Behavior: Behavior normal.     Lab Results  Component Value Date   WBC 3.7 (L) 05/25/2024   HGB 16.7 05/25/2024   HCT 48.6 05/25/2024   PLT 116.0 (L) 05/25/2024   GLUCOSE 91 05/25/2024   CHOL 135 05/25/2024   TRIG 174.0 (H) 05/25/2024   HDL 31.90 (L) 05/25/2024   LDLDIRECT 68.0 05/13/2023   LDLCALC 68 05/25/2024   ALT 45 05/25/2024   AST 28 05/25/2024   NA 138 05/25/2024   K 3.5 05/25/2024   CL 100 05/25/2024   CREATININE 0.80 05/25/2024   BUN 15 05/25/2024   CO2 26 05/25/2024   TSH 2.87 05/25/2024   PSA 0.86 05/25/2024   INR 1.1 (H) 10/13/2023   HGBA1C 7.5 (H) 05/25/2024   MICROALBUR 1.8 05/25/2024    CT Chest Wo Contrast Result Date: 07/28/2019 CLINICAL DATA:  Lung nodule, less than 1 cm. EXAM: CT CHEST WITHOUT CONTRAST TECHNIQUE: Multidetector CT imaging of the chest was performed following the standard protocol without IV contrast. COMPARISON:  CT chest July 10, 2018 FINDINGS: Cardiovascular: Scattered atherosclerosis and signs of coronary artery disease. Extensive coronary artery calcifications including left main and 3 vessels along with right coronary circulation. No pericardial effusion.  Central pulmonary arteries are normal caliber. Vessels not well evaluated given lack of contrast. Mediastinum/Nodes: No signs of mediastinal lymphadenopathy. No axillary or hilar lymphadenopathy.  No thoracic inlet adenopathy. Lungs/Pleura: No signs effusion or consolidation. Scattered calcified nodules. No new pulmonary nodules. 4 mm right lower lobe pulmonary nodule unchanged (image 87, series 3) 5 x 5 mm nodule in the left costodiaphragmatic sulcus, stable when measured by this observer on the previous exam. Triangular perifissural opacity right chest along the minor fissure likely small subpleural lymph node slightly larger than on the prior study. Subtle ground-glass without focal characteristics present in the superior segment of the right lower lobe. Airways are patent. Upper Abdomen: Lobular liver contour with signs of fissural retraction/widening. Visualized spleen is likely mildly enlarged between 12 and 13 cm. Musculoskeletal: No signs of acute bone finding or destructive bone process. IMPRESSION: 1. Small approximately 5 mm pulmonary nodules are demonstrated in the lung bases as described. No change with direct measurement comparison. 2. Atherosclerosis and coronary artery disease. 3. Lobular hepatic contour raising the question of liver disease. Clinical and laboratory correlation may be helpful spleen is also at the upper limits of normal to slightly enlarged at approximately 12-13 cm. Aortic Atherosclerosis (ICD10-I70.0). Electronically Signed   By: Isla Blind M.D.   On: 07/28/2019 10:10   DG Chest 2 View Result Date: 05/25/2024 CLINICAL DATA:  cough EXAM: CHEST - 2 VIEW COMPARISON:  July 09, 2018 FINDINGS: Mild elevation of the right hemidiaphragm with otherwise low lung volumes. No focal airspace consolidation, pleural effusion, or pneumothorax. No cardiomegaly. No acute fracture or destructive lesion. Multilevel thoracic osteophytosis. IMPRESSION: Low lung volumes.  Otherwise, no acute  cardiopulmonary abnormality. Electronically Signed   By: Rogelia Myers M.D.   On: 05/25/2024 11:28     Assessment & Plan:  B12 deficiency -     CBC with Differential/Platelet; Future  Hyperlipidemia with target LDL less than  100- LDL goal achieved. Doing well on the statin  -     Lipid panel; Future -     TSH; Future -     Rosuvastatin  Calcium ; Take 1 tablet (10 mg total) by mouth daily.  Dispense: 90 tablet; Refill: 1  Essential hypertension- He has not achieved his BP goal. EKG is negative LVH. He will work on his lifestyle modifications. -     Urinalysis, Routine w reflex microscopic; Future -     Basic metabolic panel with GFR; Future -     EKG 12-Lead -     Triamterene -HCTZ; Take 1 each (1 capsule total) by mouth daily.  Dispense: 90 capsule; Refill: 1  BPH associated with nocturia -     PSA; Future  Nonalcoholic steatohepatitis (NASH) -     Hepatic function panel; Future  Chronic ITP (idiopathic thrombocytopenia) (HCC)- PLTs are stable. No bleeding or bruising. -     CBC with Differential/Platelet; Future  Insulin -requiring or dependent type II diabetes mellitus (HCC)- Blood sugar is adequately well controlled. -     Microalbumin / creatinine urine ratio; Future -     Hemoglobin A1c; Future -     Basic metabolic panel with GFR; Future -     HM Diabetes Foot Exam -     metFORMIN  HCl ER; Take 1 tablet (750 mg total) by mouth daily with breakfast.  Dispense: 90 tablet; Refill: 1 -     Farxiga ; Take 1 tablet (10 mg total) by mouth daily.  Dispense: 90 tablet; Refill: 1 -     Soliqua ; Inject 60 Units into the skin daily.  Dispense: 54 mL; Refill: 1  Encounter for general adult medical examination with abnormal findings- Exam completed, labs reviewed, vaccines reviewed and updated, cancer screenings addressed, pt ed material was given.   Barrett's esophagus without dysplasia -     Ambulatory referral to Gastroenterology  Need for prophylactic vaccination and inoculation  against varicella -     Shingrix ; Inject 0.5 mLs into the muscle once for 1 dose.  Dispense: 0.5 mL; Refill: 1  Chronic cough -     DG Chest 2 View; Future  Type II diabetes mellitus with manifestations (HCC)  Coronary atherosclerosis due to calcified coronary lesion -     Rosuvastatin  Calcium ; Take 1 tablet (10 mg total) by mouth daily.  Dispense: 90 tablet; Refill: 1  Hypokalemia -     Triamterene -HCTZ; Take 1 each (1 capsule total) by mouth daily.  Dispense: 90 capsule; Refill: 1     Follow-up: Return in about 6 months (around 11/25/2024).  Debby Molt, MD

## 2024-05-25 NOTE — Telephone Encounter (Signed)
 Patient has been seen in office TODAY.

## 2024-06-08 ENCOUNTER — Telehealth: Payer: Self-pay | Admitting: Pharmacy Technician

## 2024-06-08 NOTE — Progress Notes (Signed)
   06/08/2024 Name: Alex [name] MRN: [id number] DOB: November 09, 1955  Patient is appearing on a report for [facility] [place] [facility] and last engaged with the clinical pharmacist to discuss diabetes on 12/17/2023. Contacted patient today to discuss diabetes management and completed medication review.   Diabetes Plan from last clinical pharmacist appointment:  Diabetes: - Currently uncontrolled, goal A1c <7% - BG have improved since starting Farxiga . Await next A1c check. Due for annual physical in July(copy/paste from last note)   Medication Adherence Barriers Identified:  Patient made recommended medication changes per plan: Yes Patient has had office visit with PCP since last PharmD visit. Patient informs he takes 2 Metformin  ER 750mg  daily but knows to take 1 as outlined by MD at 05/25/24 office vist. He also informs he takes Farxiga  10mg  daily and Soliqua . He informs he is using 56 units daily though he knows the MD wrote the prescription for 60 units. He informs he uses his discretion on when to inject 56 vs 60. Access issues with any new medication or testing device: No Patient informs no cost concerns for the medications outlined above and informs he has them on hand. Per Dr Annemarie, Metformin  last sold for 90 days supply on 04/07/24, Soliqua  on 05/26/24 for 87 days supply and Free[employer]ensors on 7/23 for 30 day supply.. There was no data for Farxiga .  Patient is checking blood sugars as prescribed: Yes Patient uses CGM for blood sugar checks. He informs he averages around 150. He informs the blood sugar will be around 120-130 and then goes up over 150 after he eats but will come back down. He informs. His A1c in July was 7.5 and that everything is going good and Dr. Joshua is keeping watch on his diabetes.  Medication Adherence Barriers Addressed/Actions Taken:  Reviewed medication changes per plan from last clinical pharmacist note Educated patient to contact pharmacy regarding new  prescriptions and refills  Reviewed instructions for monitoring blood sugars at home and reminded patient to keep a written log to review with pharmacist Reminded patient of date/time of upcoming clinical pharmacist follow up and any upcoming PCP/specialists visits. Patient denies transportation barriers to the appointment. Yes  Next clinical pharmacist appointment is scheduled for: TBD  Kate Caddy, CPhT [facility] Office: [phone] Email: [email or web address]

## 2024-06-09 ENCOUNTER — Encounter: Payer: Self-pay | Admitting: Internal Medicine

## 2024-06-14 ENCOUNTER — Ambulatory Visit (INDEPENDENT_AMBULATORY_CARE_PROVIDER_SITE_OTHER)

## 2024-06-14 DIAGNOSIS — E538 Deficiency of other specified B group vitamins: Secondary | ICD-10-CM | POA: Diagnosis not present

## 2024-06-14 MED ORDER — CYANOCOBALAMIN 1000 MCG/ML IJ SOLN
1000.0000 ug | Freq: Once | INTRAMUSCULAR | Status: AC
  Administered 2024-06-14: 1000 ug via INTRAMUSCULAR

## 2024-06-14 NOTE — Progress Notes (Signed)
Pt here for monthly B12 injection per Dr. Yetta Barre  B12 given IM. and pt tolerated injection well.

## 2024-07-01 ENCOUNTER — Ambulatory Visit (AMBULATORY_SURGERY_CENTER)

## 2024-07-01 ENCOUNTER — Encounter: Payer: Self-pay | Admitting: Internal Medicine

## 2024-07-01 VITALS — Ht 70.0 in | Wt 230.0 lb

## 2024-07-01 DIAGNOSIS — K2270 Barrett's esophagus without dysplasia: Secondary | ICD-10-CM

## 2024-07-01 NOTE — Progress Notes (Signed)
 Pre visit completed via phone call; Patient verified name, DOB, and address; No egg or soy allergy known to patient  No issues known to pt with past sedation with any surgeries or procedures Patient denies ever being told they had issues or difficulty with intubation  No FH of Malignant Hyperthermia Pt is not on diet pills Pt is not on home 02  Pt is not on blood thinners  Pt denies issues with constipation  No A fib or A flutter Have any cardiac testing pending--NO Insurance verified during PV appt--- [facility] Medicare Pt can ambulate without assistance;  Pt denies use of chewing tobacco Discussed diabetic/weight loss medication holds; Discussed NSAID holds; Checked BMI to be less than 50; Pt instructed to use Single[employer].com or GoodRx for a price reduction on prep  Patient's chart reviewed by Norleen Schillings CNRA prior to previsit and patient appropriate for the [facility].  Pre visit completed and red dot placed by patient's name on their procedure day (on provider's schedule).    Instructions sent to MyChart per patient request;

## 2024-07-08 ENCOUNTER — Telehealth: Payer: Self-pay | Admitting: *Deleted

## 2024-07-08 NOTE — Progress Notes (Unsigned)
 Complex Care Management Care Guide Note  07/08/2024 Name: Alex [name] MRN: [id number] DOB: [birth year]  Alex [name] is a 68 y.o. year old male who is a primary care patient of Joshua Debby CROME, MD and is actively engaged with the care management team. I reached out to Alex [name] by phone today to assist with re-scheduling  with the Pharmacist.  Follow up plan: Unsuccessful telephone outreach attempt made. A HIPAA compliant phone message was left for the patient providing contact information and requesting a return call.  Thedford Franks, CMA [facility] [place]  [facility], [facility] Guide Direct Dial: [phone]  Fax: [phone] Website: [email or web address]

## 2024-07-09 NOTE — Progress Notes (Signed)
 Complex Care Management Care Guide Note  07/09/2024 Name: Alex [name] MRN: [id number] DOB: 1956/03/23  Alex [name] is a 68 y.o. year old male who is a primary care patient of Joshua Debby CROME, MD and is actively engaged with the care management team. I reached out to Alex [name] by phone today to assist with re-scheduling  with the Pharmacist.  Follow up plan: Unsuccessful telephone outreach attempt made. A HIPAA compliant phone message was left for the patient providing contact information and requesting a return call. No further outreach attempts will be made due to inability to maintain patient contact.   Thedford Franks, CMA, Care Guide [facility]  [facility], [facility] Guide Direct Dial: [phone]  Fax: [phone] Website: [email or web address]

## 2024-07-15 ENCOUNTER — Ambulatory Visit: Admitting: Internal Medicine

## 2024-07-15 ENCOUNTER — Encounter: Payer: Self-pay | Admitting: Internal Medicine

## 2024-07-15 VITALS — BP 141/84 | HR 79 | Temp 98.1°F | Resp 12 | Ht 70.0 in | Wt 230.0 lb

## 2024-07-15 DIAGNOSIS — K317 Polyp of stomach and duodenum: Secondary | ICD-10-CM

## 2024-07-15 DIAGNOSIS — K219 Gastro-esophageal reflux disease without esophagitis: Secondary | ICD-10-CM | POA: Diagnosis not present

## 2024-07-15 DIAGNOSIS — Z13810 Encounter for screening for upper gastrointestinal disorder: Secondary | ICD-10-CM

## 2024-07-15 DIAGNOSIS — K2270 Barrett's esophagus without dysplasia: Secondary | ICD-10-CM | POA: Diagnosis not present

## 2024-07-15 DIAGNOSIS — K449 Diaphragmatic hernia without obstruction or gangrene: Secondary | ICD-10-CM

## 2024-07-15 DIAGNOSIS — G4733 Obstructive sleep apnea (adult) (pediatric): Secondary | ICD-10-CM | POA: Diagnosis not present

## 2024-07-15 MED ORDER — SODIUM CHLORIDE 0.9 % IV SOLN: 500.0000 mL | Freq: Once | INTRAVENOUS | Status: DC

## 2024-07-15 NOTE — Patient Instructions (Signed)

## 2024-07-15 NOTE — Op Note (Signed)
 [facility] Patient Name: Alex [name] Procedure Date: 07/15/2024 9:06 AM MRN: [id number] Endoscopist: Norleen SAILOR. Abran , MD, [id number] Age: 68 Referring MD:  Date of Birth: October 22, 1956 Gender: Male Account #: [id number] Procedure:                Upper GI endoscopy with biopsies Indications:              Esophageal reflux, Surveillance for malignancy due                            to personal history of Barrett's esophagus is all                            Dragon Medicines:                Monitored Anesthesia Care Procedure:                Pre-Anesthesia Assessment:                           - Prior to the procedure, a History and Physical                            was performed, and patient medications and                            allergies were reviewed. The patient's tolerance of                            previous anesthesia was also reviewed. The risks                            and benefits of the procedure and the sedation                            options and risks were discussed with the patient.                            All questions were answered, and informed consent                            was obtained. Prior Anticoagulants: The patient has                            taken no anticoagulant or antiplatelet agents. ASA                            Grade Assessment: III - A patient with severe                            systemic disease. After reviewing the risks and                            benefits, the patient was deemed in satisfactory  condition to undergo the procedure.                           After obtaining informed consent, the endoscope was                            passed under direct vision. Throughout the                            procedure, the patient's blood pressure, pulse, and                            oxygen saturations were monitored continuously. The                            Olympus Scope SN [id number]  was introduced through the                            mouth, and advanced to the second part of duodenum.                            The upper GI endoscopy was accomplished without                            difficulty. The patient tolerated the procedure                            well. Scope In: Scope Out: Findings:                 The esophagus revealed short segment Barrett's in                            the distalmost portion (C0, M1). No inflammation or                            mucosal irregularities. Biopsies were taken with a                            cold forceps for histology.                           The stomach revealed a few small benign fundic                            gland type polyps and a small hiatal hernia.                            Otherwise normal.                           The examined duodenum was normal.                           The cardia and gastric fundus were normal on  retroflexion. Complications:            No immediate complications. Estimated Blood Loss:     Estimated blood loss: none. Impression:               1. Short segment Barrett's status post biopsies                           2. Otherwise unremarkable exam Recommendation:           - Patient has a contact number available for                            emergencies. The signs and symptoms of potential                            delayed complications were discussed with the                            patient. Return to normal activities tomorrow.                            Written discharge instructions were provided to the                            patient.                           - Resume previous diet.                           - Continue present medications.                           - Follow up biopsies                           - Repeat EGD in 5 years if no dysplasia. Norleen SAILOR. Abran, MD 07/15/2024 9:37:07 AM This report has been signed electronically.

## 2024-07-15 NOTE — Progress Notes (Signed)
 HISTORY OF PRESENT ILLNESS:  Alex [name] is a 68 y.o. male with GERD complicated by short segment Barrett's esophagus.  Presents now for surveillance colonoscopy with biopsies.  No complaints.  REVIEW OF SYSTEMS:  All non-GI ROS negative except for  Past Medical History:  Diagnosis Date   Achilles tendinitis    PMH of   Allergy    Arthritis    B12 deficiency    Barrett's esophagus    Diabetes mellitus without complication (HCC)    Diverticulosis of colon (without mention of hemorrhage) 2010   Fatty liver 10/16/2011   US    GERD (gastroesophageal reflux disease)    Bertrum syndrome    Hiatal hernia    Homocystinemia    Hyperlipidemia    Hypertension    Nonspecific elevation of levels of transaminase or lactic acid dehydrogenase (LDH)    PMH of   OSA on CPAP    does not use CPAP (07/01/2024)   Sleep apnea     Past Surgical History:  Procedure Laterality Date   COLONOSCOPY  2010   negative   KNEE ARTHROSCOPY     R 2015 & L 2016; [facility]   REPLACEMENT TOTAL KNEE Right 01/15/2018   REPLACEMENT TOTAL KNEE Left 02/19/2018   UPPER GASTROINTESTINAL ENDOSCOPY  2020   Dr Jakie; multiple  snce 1992, Dr. Abran   VASECTOMY     WISDOM TOOTH EXTRACTION      Social History SAIR [name]  reports that he has never smoked. He has never used smokeless tobacco. He reports that he does not drink alcohol and does not use drugs.  family history includes Cancer in his mother; Esophageal cancer (age of onset: 78) in his father; Heart attack (age of onset: 57) in his mother; Heart disease in his father; Hypertension in his father, mother, sister, sister, and sister; Stomach cancer (age of onset: 69) in his father.  Allergies  Allergen Reactions   Diltiazem  Other (See Comments)    Leg edema   Spironolactone  Other (See Comments)    gynecomastia       PHYSICAL EXAMINATION: Vital signs: BP 139/86 (BP Location: Right Arm, Patient Position: Sitting, Cuff Size: Normal)    Pulse 86   Temp 98.1 F (36.7 C) (Temporal)   Ht 5' 10 (1.778 m)   Wt 230 lb (104.3 kg)   SpO2 95%   BMI 33.00 kg/m  General: Well-developed, well-nourished, no acute distress HEENT: Sclerae are anicteric, conjunctiva pink. Oral mucosa intact Lungs: Clear Heart: Regular Abdomen: soft, nontender, nondistended, no obvious ascites, no peritoneal signs, normal bowel sounds. No organomegaly. Extremities: No edema Psychiatric: alert and oriented x3. Cooperative     ASSESSMENT:  GERD with short segment Barrett's   PLAN:  EGD with biopsies

## 2024-07-15 NOTE — Progress Notes (Signed)
 A/o x 3, VSS, gd SR's, pleased with anesthesia, report to RN

## 2024-07-15 NOTE — Progress Notes (Signed)
 Called to room to assist during endoscopic procedure.  Patient ID and intended procedure confirmed with present staff. Received instructions for my participation in the procedure from the performing physician.

## 2024-07-15 NOTE — Progress Notes (Signed)
 Vitals-AM  Pt's states no medical or surgical changes since previsit or office visit.

## 2024-07-16 ENCOUNTER — Telehealth: Payer: Self-pay | Admitting: *Deleted

## 2024-07-16 NOTE — Telephone Encounter (Signed)
  Follow up Call-     07/15/2024    7:48 AM 07/15/2024    7:37 AM  Call back number  Post procedure Call Back phone  # [phone]   Permission to leave phone message  Yes     Patient questions:  Do you have a fever, pain , or abdominal swelling? No. Pain Score  0 *  Have you tolerated food without any problems? Yes.    Have you been able to return to your normal activities? Yes.    Do you have any questions about your discharge instructions: Diet   No. Medications  No. Follow up visit  No.  Do you have questions or concerns about your Care? No.  Actions: * If pain score is 4 or above: No action needed, pain <4.

## 2024-07-19 ENCOUNTER — Other Ambulatory Visit: Payer: Self-pay | Admitting: Internal Medicine

## 2024-07-19 ENCOUNTER — Ambulatory Visit: Payer: Self-pay | Admitting: Internal Medicine

## 2024-07-19 DIAGNOSIS — I2510 Atherosclerotic heart disease of native coronary artery without angina pectoris: Secondary | ICD-10-CM

## 2024-07-19 DIAGNOSIS — I10 Essential (primary) hypertension: Secondary | ICD-10-CM

## 2024-07-19 LAB — SURGICAL PATHOLOGY

## 2024-07-21 ENCOUNTER — Ambulatory Visit (INDEPENDENT_AMBULATORY_CARE_PROVIDER_SITE_OTHER)

## 2024-07-21 DIAGNOSIS — E538 Deficiency of other specified B group vitamins: Secondary | ICD-10-CM | POA: Diagnosis not present

## 2024-07-21 MED ORDER — CYANOCOBALAMIN 1000 MCG/ML IJ SOLN
1000.0000 ug | Freq: Once | INTRAMUSCULAR | Status: AC
  Administered 2024-07-21: 1000 ug via INTRAMUSCULAR

## 2024-07-21 NOTE — Progress Notes (Signed)
 Pt here for monthly B12 injection per Dr. Joshua  B12 1000mcg given IM and pt tolerated injection well.  Next B12 injection scheduled

## 2024-07-27 ENCOUNTER — Other Ambulatory Visit: Payer: Self-pay | Admitting: Internal Medicine

## 2024-07-27 DIAGNOSIS — E119 Type 2 diabetes mellitus without complications: Secondary | ICD-10-CM

## 2024-08-13 ENCOUNTER — Telehealth: Payer: Self-pay

## 2024-08-13 NOTE — Telephone Encounter (Signed)
 Gave pt a call, pt is coming up due for re-enrollment for [employer]  spoke with pt explain he received an email sayinh he was pre-approved but did not call or email pt ask for AZ&ME numv=ber was provide  and will be contacting AZ&ME if after that he has any question he will call back.

## 2024-08-17 ENCOUNTER — Other Ambulatory Visit: Payer: Self-pay | Admitting: Internal Medicine

## 2024-08-17 DIAGNOSIS — E876 Hypokalemia: Secondary | ICD-10-CM

## 2024-08-17 DIAGNOSIS — I10 Essential (primary) hypertension: Secondary | ICD-10-CM

## 2024-08-19 ENCOUNTER — Encounter: Payer: Self-pay | Admitting: Internal Medicine

## 2024-08-20 NOTE — Telephone Encounter (Signed)
 Pt call back said he call [employer]ME and has been approved for re-enrollment for 2026.

## 2024-08-22 ENCOUNTER — Other Ambulatory Visit: Payer: Self-pay | Admitting: Internal Medicine

## 2024-08-22 DIAGNOSIS — E876 Hypokalemia: Secondary | ICD-10-CM

## 2024-08-22 DIAGNOSIS — I10 Essential (primary) hypertension: Secondary | ICD-10-CM

## 2024-08-25 ENCOUNTER — Ambulatory Visit

## 2024-08-25 ENCOUNTER — Other Ambulatory Visit: Payer: Self-pay

## 2024-08-25 ENCOUNTER — Other Ambulatory Visit: Payer: Self-pay | Admitting: Internal Medicine

## 2024-08-25 DIAGNOSIS — E538 Deficiency of other specified B group vitamins: Secondary | ICD-10-CM | POA: Diagnosis not present

## 2024-08-25 DIAGNOSIS — E119 Type 2 diabetes mellitus without complications: Secondary | ICD-10-CM

## 2024-08-25 MED ORDER — FARXIGA 10 MG PO TABS: 10.0000 mg | ORAL_TABLET | Freq: Every day | ORAL | 3 refills | Status: DC

## 2024-08-25 MED ORDER — FARXIGA 10 MG PO TABS
10.0000 mg | ORAL_TABLET | Freq: Every day | ORAL | 1 refills | Status: AC
Start: 2024-08-25 — End: ?

## 2024-08-25 MED ORDER — CYANOCOBALAMIN 1000 MCG/ML IJ SOLN
1000.0000 ug | Freq: Once | INTRAMUSCULAR | Status: AC
  Administered 2024-08-25: 1000 ug via INTRAMUSCULAR

## 2024-08-25 NOTE — Progress Notes (Signed)
 After obtaining consent, and per orders of Dr. Joshua, injection of B12 given by Ronnald SHAUNNA Palms. Patient instructed to report any adverse reaction to me immediately.

## 2024-09-07 ENCOUNTER — Encounter: Payer: Self-pay | Admitting: *Deleted

## 2024-09-07 NOTE — Progress Notes (Signed)
 SEVAN MCBROOM                                          MRN: [id number]   09/07/2024   The [facility] Quality Team Specialist reviewed this patient medical record for the purposes of chart review for care gap closure. The following were reviewed: abstraction for care gap closure-glycemic status assessment.    [facility] Quality Team

## 2024-09-08 ENCOUNTER — Ambulatory Visit (INDEPENDENT_AMBULATORY_CARE_PROVIDER_SITE_OTHER): Admitting: Internal Medicine

## 2024-09-08 ENCOUNTER — Encounter: Payer: Self-pay | Admitting: Internal Medicine

## 2024-09-08 VITALS — BP 150/80 | HR 90 | Temp 98.7°F | Ht 70.0 in | Wt 234.0 lb

## 2024-09-08 DIAGNOSIS — J3089 Other allergic rhinitis: Secondary | ICD-10-CM

## 2024-09-08 DIAGNOSIS — R051 Acute cough: Secondary | ICD-10-CM

## 2024-09-08 DIAGNOSIS — K219 Gastro-esophageal reflux disease without esophagitis: Secondary | ICD-10-CM

## 2024-09-08 LAB — POC COVID19 BINAXNOW: SARS Coronavirus 2 Ag: NEGATIVE

## 2024-09-08 MED ORDER — METHYLPREDNISOLONE 4 MG PO TBPK: ORAL_TABLET | ORAL | 0 refills | Status: DC

## 2024-09-08 NOTE — Patient Instructions (Addendum)
     Medications changes include :   medrok dose pak      Return if symptoms worsen or fail to improve.

## 2024-09-08 NOTE — Progress Notes (Signed)
 Subjective:    Patient ID: Alex [name], male    DOB: 12/09/1955, 68 y.o.   MRN: [id number]      HPI Alex [name] is here for  Chief Complaint  Patient presents with   Cough    Cough and hoarseness   Discussed the use of AI scribe software for clinical note transcription with the patient, who gave verbal consent to proceed.  History of Present Illness Alex [name] is a 68 year old male who presents with hoarseness and a dry cough.  He has been experiencing hoarseness and a dry cough, which he attributes to exposure to cold air. These symptoms occur annually around this time of year. He was out early in the cold yesterday morning, which he believes triggered the symptoms. He recalls a similar episode two years ago before a trip to [place].  No fever, chills, nasal congestion, wheezing, shortness of breath, headaches, lightheadedness, dizziness, body aches, nausea, diarrhea, sore throat, sinus pain, pressure, or ear pain. He notes a slight postnasal drip, which he thinks may trigger the cough.  He is currently taking Tylenol  and Delsom, which he reports take the edge off his symptoms. He also takes fexofenadine daily.         Medications and allergies reviewed with patient and updated if appropriate.  Current Outpatient Medications on File Prior to Visit  Medication Sig Dispense Refill   BD PEN NEEDLE MICRO U/F 32G X 6 MM MISC USE DAILY AS DIRECTED 100 each 1   celecoxib (CELEBREX) 200 MG capsule Take 200 mg by mouth daily as needed. (Patient taking differently: Take 200 mg by mouth daily.)     Cholecalciferol  (VITAMIN D3) 50 MCG (2000 UT) TABS TAKE 1 TABLET BY MOUTH DAILY 90 tablet 1   Continuous Glucose Receiver (FREESTYLE LIBRE 2 READER) DEVI 1 Act by Does not apply route daily. 6 each 1   Continuous Glucose Sensor (FREESTYLE LIBRE 3 PLUS SENSOR) MISC APPLY 1 SENSOR TO SKIN EVERY 15 DAYS 2 each 0   esomeprazole  (NEXIUM ) 40 MG capsule TAKE 1 CAPSULE BY  MOUTH EVERY MORNING BEFORE BREAKFAST (Patient taking differently: Take 40 mg by mouth daily at 12 noon. TAKE 1 CAPSULE BY MOUTH EVERY MORNING BEFORE BREAKFAST) 30 capsule 5   FARXIGA  10 MG TABS tablet Take 1 tablet (10 mg total) by mouth daily. 90 tablet 1   fexofenadine (ALLEGRA) 180 MG tablet Take 180 mg by mouth daily.     Insulin  Glargine-Lixisenatide (SOLIQUA ) 100-33 UNT-MCG/ML SOPN Inject 60 Units into the skin daily. 54 mL 1   irbesartan  (AVAPRO ) 300 MG tablet TAKE 1 TABLET(300 MG) BY MOUTH DAILY 90 tablet 0   metFORMIN  (GLUCOPHAGE -XR) 750 MG 24 hr tablet Take 1 tablet (750 mg total) by mouth daily with breakfast. (Patient taking differently: Take 1,500 mg by mouth daily with breakfast.) 90 tablet 1   potassium chloride  SA (KLOR-CON  M) 20 MEQ tablet TAKE 1 TABLET BY MOUTH DAILY 90 tablet 0   rosuvastatin  (CRESTOR ) 10 MG tablet Take 1 tablet (10 mg total) by mouth daily. 90 tablet 1   triamterene -hydrochlorothiazide  (DYAZIDE) 37.5-25 MG capsule TAKE 1 EACH(1 CAPSULE TOTAL) BY MOUTH EVERY DAY 90 capsule 0   No current facility-administered medications on file prior to visit.    Review of Systems  Constitutional:  Negative for chills and fever.  HENT:  Positive for postnasal drip and voice change. Negative for congestion, ear pain, sinus pressure, sinus pain and sore throat.   Respiratory:  Positive for cough (dry). Negative for shortness of breath and wheezing.   Gastrointestinal:  Negative for diarrhea and nausea.  Musculoskeletal:  Negative for myalgias.  Neurological:  Negative for dizziness, light-headedness and headaches.       Objective:   Vitals:   09/08/24 1535  BP: (!) 150/80  Pulse: 90  Temp: 98.7 F (37.1 C)  SpO2: 94%   BP Readings from Last 3 Encounters:  09/08/24 (!) 150/80  07/15/24 (!) 141/84  05/25/24 (!) 142/86   Wt Readings from Last 3 Encounters:  09/08/24 234 lb (106.1 kg)  07/15/24 230 lb (104.3 kg)  07/01/24 230 lb (104.3 kg)   Body mass index is  33.58 kg/m.    Physical Exam Constitutional:      General: He is not in acute distress.    Appearance: Normal appearance. He is not ill-appearing.  HENT:     Head: Normocephalic.     Right Ear: Tympanic membrane, ear canal and external ear normal. There is no impacted cerumen.     Left Ear: Tympanic membrane, ear canal and external ear normal. There is no impacted cerumen.     Mouth/Throat:     Mouth: Mucous membranes are moist.     Pharynx: No oropharyngeal exudate or posterior oropharyngeal erythema.  Eyes:     Conjunctiva/sclera: Conjunctivae normal.  Cardiovascular:     Rate and Rhythm: Normal rate and regular rhythm.  Pulmonary:     Effort: Pulmonary effort is normal. No respiratory distress.     Breath sounds: Normal breath sounds. No wheezing or rales.  Musculoskeletal:     Cervical back: Neck supple. No tenderness.  Lymphadenopathy:     Cervical: No cervical adenopathy.  Skin:    General: Skin is warm and dry.     Findings: No rash.  Neurological:     Mental Status: He is alert.            Assessment & Plan:    See Problem List for Assessment and Plan of chronic medical problems.    Assessment and Plan Assessment & Plan Acute upper respiratory symptoms (dry cough, hoarseness, postnasal drip) Acute dry cough, hoarseness, and postnasal drip likely due to cold air exposure vs seasonal allergy flare. No signs of COVID-19, influenza, or bacterial infection.  - COVID test here negative - Prescribed Medrol  Dosepak for inflammation. - Continue fexofenadine daily for drainage control.   Allergic rhinitis  He feels his allergies are well-controlled-symptoms could be related to URI or possible flare of seasonal allergies. - Continue fexofenadine daily. - Medrol  Dosepak  Gastroesophageal reflux disease (controlled) Gastroesophageal reflux disease is well-controlled. - Continue current management for GERD and Nexium  40 mg daily.

## 2024-09-14 ENCOUNTER — Other Ambulatory Visit: Payer: Self-pay | Admitting: Internal Medicine

## 2024-09-14 DIAGNOSIS — I2510 Atherosclerotic heart disease of native coronary artery without angina pectoris: Secondary | ICD-10-CM

## 2024-09-14 DIAGNOSIS — E785 Hyperlipidemia, unspecified: Secondary | ICD-10-CM

## 2024-09-26 ENCOUNTER — Other Ambulatory Visit: Payer: Self-pay | Admitting: Internal Medicine

## 2024-09-26 DIAGNOSIS — E119 Type 2 diabetes mellitus without complications: Secondary | ICD-10-CM

## 2024-09-27 ENCOUNTER — Ambulatory Visit

## 2024-10-04 ENCOUNTER — Ambulatory Visit: Payer: Medicare Other

## 2024-10-04 VITALS — BP 143/80 | HR 84 | Ht 69.0 in | Wt 233.4 lb

## 2024-10-04 DIAGNOSIS — Z0000 Encounter for general adult medical examination without abnormal findings: Secondary | ICD-10-CM

## 2024-10-04 NOTE — Progress Notes (Addendum)
 Chief Complaint  Patient presents with   Medicare Wellness     Subjective:   Alex [name] is a 68 y.o. male who presents for a Medicare Annual Wellness Visit.  Visit info / Clinical Intake: Medicare Wellness Visit Type:: Subsequent Annual Wellness Visit Persons participating in visit and providing information:: patient Medicare Wellness Visit Mode:: In-person (required for WTM) Interpreter Needed?: No Pre-visit prep was completed: no AWV questionnaire completed by patient prior to visit?: no Living arrangements:: lives with spouse/significant other Patient's Overall Health Status Rating: good Typical amount of pain: none Does pain affect daily life?: no Are you currently prescribed opioids?: no  Dietary Habits and Nutritional Risks How many meals a day?: 3 Eats fruit and vegetables daily?: yes Most meals are obtained by: preparing own meals; eating out In the last 2 weeks, have you had any of the following?: none Diabetic:: (!) yes How often do you check your BS?: continuous glucose monitor (130) Would you like to be referred to a Nutritionist or for Diabetic Management? : no  Functional Status Activities of Daily Living (to include ambulation/medication): Independent Ambulation: Independent with device- listed below Home Assistive Devices/Equipment: Eyeglasses Medication Administration: Independent Home Management (perform basic housework or laundry): Independent Manage your own finances?: yes Primary transportation is: driving Concerns about vision?: no *vision screening is required for WTM* Concerns about hearing?: no  Fall Screening Falls in the past year?: 0 Number of falls in past year: 0 Was there an injury with Fall?: 0 Fall Risk Category Calculator: 0 Patient Fall Risk Level: Low Fall Risk  Fall Risk Patient at Risk for Falls Due to: No Fall Risks Fall risk Follow up: Falls evaluation completed; Falls prevention discussed  Home and Transportation  Safety: All rugs have non-skid backing?: N/A, no rugs All stairs or steps have railings?: N/A, no stairs Grab bars in the bathtub or shower?: (!) no Have non-skid surface in bathtub or shower?: yes Good home lighting?: yes Regular seat belt use?: yes Hospital stays in the last year:: no  Cognitive Assessment Difficulty concentrating, remembering, or making decisions? : no Will 6CIT or Mini Cog be Completed: yes What year is it?: 0 points What month is it?: 0 points Give patient an address phrase to remember (5 components): [address] [place], [place] About what time is it?: 0 points Count backwards from 20 to 1: 0 points Say the months of the year in reverse: 0 points Repeat the address phrase from earlier: 2 points (27) 6 CIT Score: 2 points  Advance Directives (For Healthcare) Does Patient Have a Medical Advance Directive?: No Would patient like information on creating a medical advance directive?: Yes (MAU/Ambulatory/Procedural Areas - Information given)  Reviewed/Updated  Reviewed/Updated: Reviewed All (Medical, Surgical, Family, Medications, Allergies, Care Teams, Patient Goals)    Allergies (verified) Diltiazem  and Spironolactone    Current Medications (verified) Outpatient Encounter Medications as of 10/04/2024  Medication Sig   BD PEN NEEDLE MICRO U/F 32G X 6 MM MISC USE DAILY AS DIRECTED   celecoxib (CELEBREX) 200 MG capsule Take 200 mg by mouth daily as needed. (Patient taking differently: Take 200 mg by mouth daily.)   Cholecalciferol  (VITAMIN D3) 50 MCG (2000 UT) TABS TAKE 1 TABLET BY MOUTH DAILY   Continuous Glucose Receiver (FREESTYLE LIBRE 2 READER) DEVI 1 Act by Does not apply route daily.   Continuous Glucose Sensor (FREESTYLE LIBRE 3 PLUS SENSOR) MISC APPLY 1 SENSOR TO SKIN and change EVERY 15 DAYS   esomeprazole  (NEXIUM ) 40 MG  capsule TAKE 1 CAPSULE BY MOUTH EVERY MORNING BEFORE BREAKFAST (Patient taking differently: Take 40 mg by mouth daily at 12 noon. TAKE  1 CAPSULE BY MOUTH EVERY MORNING BEFORE BREAKFAST)   FARXIGA  10 MG TABS tablet Take 1 tablet (10 mg total) by mouth daily.   fexofenadine (ALLEGRA) 180 MG tablet Take 180 mg by mouth daily.   Insulin  Glargine-Lixisenatide (SOLIQUA ) 100-33 UNT-MCG/ML SOPN Inject 60 Units into the skin daily.   irbesartan  (AVAPRO ) 300 MG tablet TAKE 1 TABLET(300 MG) BY MOUTH DAILY   metFORMIN  (GLUCOPHAGE -XR) 750 MG 24 hr tablet Take 1 tablet (750 mg total) by mouth daily with breakfast. (Patient taking differently: Take 1,500 mg by mouth daily with breakfast.)   potassium chloride  SA (KLOR-CON  M) 20 MEQ tablet TAKE 1 TABLET BY MOUTH DAILY   rosuvastatin  (CRESTOR ) 10 MG tablet TAKE 1 TABLET(10 MG) BY MOUTH DAILY   triamterene -hydrochlorothiazide  (DYAZIDE) 37.5-25 MG capsule TAKE 1 EACH(1 CAPSULE TOTAL) BY MOUTH EVERY DAY   methylPREDNISolone  (MEDROL  DOSEPAK) 4 MG TBPK tablet 24 mg PO on day 1, then decr. by 4 mg/day x5 days (Patient not taking: Reported on 10/04/2024)   No facility-administered encounter medications on file as of 10/04/2024.    History: Past Medical History:  Diagnosis Date   Achilles tendinitis    PMH of   Allergy    Arthritis    B12 deficiency    Barrett's esophagus    Diabetes mellitus without complication (HCC)    Diverticulosis of colon (without mention of hemorrhage) 2010   Fatty liver 10/16/2011   US    GERD (gastroesophageal reflux disease)    Bertrum syndrome    Hiatal hernia    Homocystinemia    Hyperlipidemia    Hypertension    Nonspecific elevation of levels of transaminase or lactic acid dehydrogenase (LDH)    PMH of   OSA on CPAP    does not use CPAP (07/01/2024)   Sleep apnea    Past Surgical History:  Procedure Laterality Date   COLONOSCOPY  2010   negative   KNEE ARTHROSCOPY     R 2015 & L 2016; [facility]   REPLACEMENT TOTAL KNEE Right 01/15/2018   REPLACEMENT TOTAL KNEE Left 02/19/2018   UPPER GASTROINTESTINAL ENDOSCOPY  2020   Dr Jakie; multiple  snce  1992, Dr. 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  Tobacco Use   Smoking status: Never   Smokeless tobacco: Never  Vaping Use   Vaping status: Never Used  Substance and Sexual Activity   Alcohol use: No   Drug use: No   Sexual activity: Yes  Tobacco Counseling Counseling given: Not Answered  SDOH Screenings   Food Insecurity: No Food Insecurity (10/04/2024)  Housing: Unknown (10/04/2024)  Transportation Needs: No Transportation Needs (10/04/2024)  Utilities: Not At Risk (10/04/2024)  Alcohol Screen: Low Risk  (10/03/2023)  Depression (PHQ2-9): Low Risk  (10/04/2024)  Financial Resource Strain: Low Risk  (10/03/2023)  Physical Activity: Insufficiently Active (10/04/2024)  Social Connections: Moderately Integrated (10/04/2024)  Stress: No Stress Concern Present (10/04/2024)  Tobacco Use: Low Risk  (10/04/2024)  Health Literacy: Adequate Health Literacy (10/04/2024)   See flowsheets for full screening details  Depression Screen PHQ 2 & 9 Depression Scale- Over the past 2 weeks, how often have you been bothered by any of the following problems? Little interest or pleasure in doing things: 0 Feeling down, depressed, or hopeless (PHQ Adolescent also includes...irritable): 0 PHQ-2 Total Score: 0 Trouble falling or staying asleep, or sleeping too much: 0 Feeling tired or having little energy:  0 Poor appetite or overeating (PHQ Adolescent also includes...weight loss): 0 Feeling bad about yourself - or that you are a failure or have let yourself or your family down: 0 Trouble concentrating on things, such as reading the newspaper or watching television (PHQ Adolescent also includes...like school work): 0 Moving or speaking so slowly that other people could have noticed. Or the opposite - being so fidgety or restless that you have been moving around a lot more than usual: 0 Thoughts that you would be better off dead, or of hurting yourself in some way: 0 PHQ-9 Total Score: 0 If you checked off any problems, how difficult have these problems made it for you to do your work, take care of things at home, or get along with other people?: Not difficult at all  Depression Treatment Depression Interventions/Treatment : EYV7-0 Score <4 Follow-up Not Indicated     Goals Addressed               This Visit's Progress     Patient Stated (pt-stated)        Patient stated he plans to continue being active and manage sugar intake             Objective:    Today's Vitals   10/04/24 1004 10/04/24 1147  BP: (!) 150/80 (!) 143/80  Pulse: 84   SpO2: 98%   Weight: 233 lb 6.4 oz (105.9 kg)   Height: 5' 9 (1.753 m)    Body mass index is 34.47 kg/m.  Hearing/Vision screen Hearing Screening - Comments:: Denies hearing difficulties   Vision Screening - Comments:: Wears rx glasses - up to date with routine eye exams with [facility] - Dr Joshua Immunizations and Health Maintenance Health Maintenance  Topic Date Due   COVID-19 Vaccine (4 - 2025-26 season) 06/28/2024   Zoster Vaccines- Shingrix  (1 of 2) 01/02/2025 (Originally 05/20/2007)   Influenza Vaccine  01/25/2025 (Originally 05/28/2024)   Pneumococcal Vaccine: 50+ Years (1 of 2 - PCV) 10/04/2025 (Originally 05/19/1976)   HEMOGLOBIN A1C  11/25/2024   OPHTHALMOLOGY EXAM  05/21/2025   Diabetic kidney evaluation - eGFR measurement   05/25/2025   Diabetic kidney evaluation - Urine ACR  05/25/2025   FOOT EXAM  05/25/2025   Medicare Annual Wellness (AWV)  10/04/2025   Colonoscopy  07/19/2029   DTaP/Tdap/Td (4 - Td or Tdap) 10/15/2033   Hepatitis C Screening  Completed   Meningococcal B Vaccine  Aged Out        Assessment/Plan:  This is a routine wellness examination for  Bernhard.  I have recommended that this patient have a immunization for Influenza, Pneumonia, and Shingles but he declines at this time. I have discussed the risks and benefits of this procedure with him. The patient verbalizes understanding.   Patient Care Team: Joshua Debby CROME, MD as PCP - General (Internal Medicine) [facility] (Optometry) [place], [place], [facility] (Pharmacist)  I have personally reviewed and noted the following in the patient's chart:   Medical and social history Use of alcohol, tobacco or illicit drugs  Current medications and supplements including opioid prescriptions. Functional ability and status Nutritional status Physical activity Advanced directives List of other physicians Hospitalizations, surgeries, and ER visits in previous 12 months Vitals Screenings to include cognitive, depression, and falls Referrals and appointments  No orders of the defined types were placed in this encounter.  In addition, I have reviewed and discussed with patient certain preventive protocols, quality metrics, and best practice recommendations. A written personalized care plan for preventive services as well as general preventive health recommendations were provided to patient.   Verdie CHRISTELLA Saba, CMA   10/04/2024   Return in 1 year (on 10/04/2025).  After Visit Summary: (In Person-Declined) Patient declined AVS at this time.  Nurse Notes: scheduled 2026 AWV appt

## 2024-10-04 NOTE — Patient Instructions (Addendum)
 Mr. Overfield,  Thank you for taking the time for your Medicare Wellness Visit. I appreciate your continued commitment to your health goals. Please review the care plan we discussed, and feel free to reach out if I can assist you further.  Please note that Annual Wellness Visits do not include a physical exam. Some assessments may be limited, especially if the visit was conducted virtually. If needed, we may recommend an in-person follow-up with your provider.  Ongoing Care Seeing your primary care provider every 3 to 6 months helps us  monitor your health and provide consistent, personalized care.   Referrals If a referral was made during today's visit and you haven't received any updates within two weeks, please contact the referred provider directly to check on the status.  Recommended Screenings:  Health Maintenance  Topic Date Due   COVID-19 Vaccine (4 - 2025-26 season) 06/28/2024   Zoster (Shingles) Vaccine (1 of 2) 01/02/2025*   Flu Shot  01/25/2025*   Pneumococcal Vaccine for age over 68 (1 of 2 - PCV) 10/04/2025*   Hemoglobin A1C  11/25/2024   Eye exam for diabetics  05/21/2025   Yearly kidney function blood test for diabetes  05/25/2025   Yearly kidney health urinalysis for diabetes  05/25/2025   Complete foot exam   05/25/2025   Medicare Annual Wellness Visit  10/04/2025   Colon Cancer Screening  07/19/2029   DTaP/Tdap/Td vaccine (4 - Td or Tdap) 10/15/2033   Hepatitis C Screening  Completed   Meningitis B Vaccine  Aged Out  *Topic was postponed. The date shown is not the original due date.       10/04/2024    9:56 AM  Advanced Directives  Does Patient Have a Medical Advance Directive? No  Would patient like information on creating a medical advance directive? Yes (MAU/Ambulatory/Procedural Areas - Information given)    Vision: Annual vision screenings are recommended for early detection of glaucoma, cataracts, and diabetic retinopathy. These exams can also reveal  signs of chronic conditions such as diabetes and high blood pressure.  Dental: Annual dental screenings help detect early signs of oral cancer, gum disease, and other conditions linked to overall health, including heart disease and diabetes.

## 2024-10-15 ENCOUNTER — Other Ambulatory Visit: Payer: Self-pay | Admitting: Internal Medicine

## 2024-10-15 DIAGNOSIS — I2510 Atherosclerotic heart disease of native coronary artery without angina pectoris: Secondary | ICD-10-CM

## 2024-10-15 DIAGNOSIS — I10 Essential (primary) hypertension: Secondary | ICD-10-CM

## 2024-10-18 ENCOUNTER — Encounter: Payer: Self-pay | Admitting: Internal Medicine

## 2024-10-19 ENCOUNTER — Other Ambulatory Visit: Payer: Self-pay | Admitting: Internal Medicine

## 2024-10-19 DIAGNOSIS — I10 Essential (primary) hypertension: Secondary | ICD-10-CM

## 2024-10-19 DIAGNOSIS — I2510 Atherosclerotic heart disease of native coronary artery without angina pectoris: Secondary | ICD-10-CM

## 2024-11-01 ENCOUNTER — Ambulatory Visit (INDEPENDENT_AMBULATORY_CARE_PROVIDER_SITE_OTHER)

## 2024-11-01 ENCOUNTER — Ambulatory Visit

## 2024-11-01 DIAGNOSIS — E538 Deficiency of other specified B group vitamins: Secondary | ICD-10-CM

## 2024-11-01 MED ORDER — CYANOCOBALAMIN 1000 MCG/ML IJ SOLN
1000.0000 ug | Freq: Once | INTRAMUSCULAR | Status: AC
  Administered 2024-11-01: 1000 ug via INTRAMUSCULAR

## 2024-11-01 NOTE — Progress Notes (Signed)
 After obtaining consent, and per orders of Dr. Joshua, injection of B12 given by Ronnald SHAUNNA Palms. Patient instructed to report any adverse reaction to me immediately.

## 2024-11-11 ENCOUNTER — Other Ambulatory Visit: Payer: Self-pay | Admitting: Internal Medicine

## 2024-11-11 DIAGNOSIS — I10 Essential (primary) hypertension: Secondary | ICD-10-CM

## 2024-11-11 DIAGNOSIS — E876 Hypokalemia: Secondary | ICD-10-CM

## 2024-11-17 ENCOUNTER — Other Ambulatory Visit: Payer: Self-pay | Admitting: Internal Medicine

## 2024-11-17 DIAGNOSIS — E876 Hypokalemia: Secondary | ICD-10-CM

## 2024-11-17 DIAGNOSIS — I10 Essential (primary) hypertension: Secondary | ICD-10-CM

## 2024-11-18 ENCOUNTER — Other Ambulatory Visit: Payer: Self-pay | Admitting: Internal Medicine

## 2024-11-18 DIAGNOSIS — E119 Type 2 diabetes mellitus without complications: Secondary | ICD-10-CM

## 2024-11-25 ENCOUNTER — Ambulatory Visit: Payer: Self-pay | Admitting: Internal Medicine

## 2024-11-25 ENCOUNTER — Ambulatory Visit: Admitting: Internal Medicine

## 2024-11-25 ENCOUNTER — Encounter: Payer: Self-pay | Admitting: Internal Medicine

## 2024-11-25 VITALS — BP 136/84 | HR 81 | Temp 97.8°F | Resp 16 | Ht 69.0 in | Wt 229.0 lb

## 2024-11-25 DIAGNOSIS — R809 Proteinuria, unspecified: Secondary | ICD-10-CM | POA: Diagnosis not present

## 2024-11-25 DIAGNOSIS — Z794 Long term (current) use of insulin: Secondary | ICD-10-CM

## 2024-11-25 DIAGNOSIS — E1129 Type 2 diabetes mellitus with other diabetic kidney complication: Secondary | ICD-10-CM | POA: Diagnosis not present

## 2024-11-25 DIAGNOSIS — E538 Deficiency of other specified B group vitamins: Secondary | ICD-10-CM

## 2024-11-25 DIAGNOSIS — K7581 Nonalcoholic steatohepatitis (NASH): Secondary | ICD-10-CM | POA: Diagnosis not present

## 2024-11-25 DIAGNOSIS — I10 Essential (primary) hypertension: Secondary | ICD-10-CM | POA: Diagnosis not present

## 2024-11-25 DIAGNOSIS — D693 Immune thrombocytopenic purpura: Secondary | ICD-10-CM

## 2024-11-25 DIAGNOSIS — E119 Type 2 diabetes mellitus without complications: Secondary | ICD-10-CM

## 2024-11-25 DIAGNOSIS — E876 Hypokalemia: Secondary | ICD-10-CM

## 2024-11-25 LAB — CBC WITH DIFFERENTIAL/PLATELET
Basophils Absolute: 0.1 10*3/uL (ref 0.0–0.1)
Basophils Relative: 1.4 % (ref 0.0–3.0)
Eosinophils Absolute: 0.1 10*3/uL (ref 0.0–0.7)
Eosinophils Relative: 2.8 % (ref 0.0–5.0)
HCT: 50.4 % (ref 39.0–52.0)
Hemoglobin: 17.5 g/dL — ABNORMAL HIGH (ref 13.0–17.0)
Lymphocytes Relative: 27.9 % (ref 12.0–46.0)
Lymphs Abs: 1 10*3/uL (ref 0.7–4.0)
MCHC: 34.7 g/dL (ref 30.0–36.0)
MCV: 93.4 fl (ref 78.0–100.0)
Monocytes Absolute: 0.4 10*3/uL (ref 0.1–1.0)
Monocytes Relative: 12 % (ref 3.0–12.0)
Neutro Abs: 2 10*3/uL (ref 1.4–7.7)
Neutrophils Relative %: 55.9 % (ref 43.0–77.0)
Platelets: 135 10*3/uL — ABNORMAL LOW (ref 150.0–400.0)
RBC: 5.4 Mil/uL (ref 4.22–5.81)
RDW: 13.3 % (ref 11.5–15.5)
WBC: 3.6 10*3/uL — ABNORMAL LOW (ref 4.0–10.5)

## 2024-11-25 LAB — HEPATIC FUNCTION PANEL
ALT: 48 U/L (ref 3–53)
AST: 30 U/L (ref 5–37)
Albumin: 4.7 g/dL (ref 3.5–5.2)
Alkaline Phosphatase: 50 U/L (ref 39–117)
Bilirubin, Direct: 0.2 mg/dL (ref 0.1–0.3)
Total Bilirubin: 1 mg/dL (ref 0.2–1.2)
Total Protein: 7.2 g/dL (ref 6.0–8.3)

## 2024-11-25 LAB — MICROALBUMIN / CREATININE URINE RATIO
Creatinine,U: 152.5 mg/dL
Microalb Creat Ratio: 64.1 mg/g — ABNORMAL HIGH (ref 0.0–30.0)
Microalb, Ur: 9.8 mg/dL — ABNORMAL HIGH (ref 0.7–1.9)

## 2024-11-25 LAB — URINALYSIS, ROUTINE W REFLEX MICROSCOPIC
Bilirubin Urine: NEGATIVE
Hgb urine dipstick: NEGATIVE
Leukocytes,Ua: NEGATIVE
Nitrite: NEGATIVE
Specific Gravity, Urine: 1.02 (ref 1.000–1.030)
Total Protein, Urine: 30 — AB
Urine Glucose: >=1000 — AB
Urobilinogen, UA: 0.2 (ref 0.0–1.0)
pH: 6 (ref 5.0–8.0)

## 2024-11-25 LAB — PROTIME-INR
INR: 1 ratio (ref 0.8–1.0)
Prothrombin Time: 11.4 s (ref 9.6–13.1)

## 2024-11-25 LAB — BASIC METABOLIC PANEL WITH GFR
BUN: 18 mg/dL (ref 6–23)
CO2: 29 meq/L (ref 19–32)
Calcium: 9.4 mg/dL (ref 8.4–10.5)
Chloride: 101 meq/L (ref 96–112)
Creatinine, Ser: 0.79 mg/dL (ref 0.40–1.50)
GFR: 91.92 mL/min
Glucose, Bld: 94 mg/dL (ref 70–99)
Potassium: 3.5 meq/L (ref 3.5–5.1)
Sodium: 139 meq/L (ref 135–145)

## 2024-11-25 LAB — HEMOGLOBIN A1C: Hgb A1c MFr Bld: 8.1 % — ABNORMAL HIGH (ref 4.6–6.5)

## 2024-11-25 LAB — FOLATE: Folate: >23.4 ng/mL

## 2024-11-25 LAB — VITAMIN B12: Vitamin B-12: 348 pg/mL (ref 211–911)

## 2024-11-25 MED ORDER — METFORMIN HCL ER 750 MG PO TB24: 1500.0000 mg | ORAL_TABLET | Freq: Every day | ORAL | 1 refills | Status: AC

## 2024-11-25 MED ORDER — TRIAMTERENE-HCTZ 37.5-25 MG PO CAPS: 1.0000 | ORAL_CAPSULE | Freq: Every day | ORAL | 0 refills | Status: AC

## 2024-11-25 MED ORDER — INSULIN PEN NEEDLE 32G X 6 MM MISC: 1.0000 | 1 refills | Status: AC

## 2024-11-25 MED ORDER — SOLIQUA 100-33 UNT-MCG/ML ~~LOC~~ SOPN: 60.0000 [IU] | PEN_INJECTOR | Freq: Every day | SUBCUTANEOUS | 1 refills | Status: AC

## 2024-11-25 NOTE — Patient Instructions (Signed)

## 2024-11-26 ENCOUNTER — Telehealth: Payer: Self-pay

## 2024-11-26 NOTE — Progress Notes (Signed)
 Care Guide Pharmacy Note  11/26/2024 Name: REGIE BUNNER MRN: [id number] DOB: [birth year]  Referred By: Joshua Debby CROME, MD Reason for referral: Call Attempt #1 and Complex Care Management (Outreach to sch ref w/ pharm)   REINHOLD RICKEY is a 69 y.o. year old male who is a primary care patient of Joshua Debby CROME, MD.  Carlin CHRISTELLA Herring was referred to the pharmacist for assistance related to: HTN  Successful contact was made with the patient to discuss pharmacy services including being ready for the pharmacist to call at least 5 minutes before the scheduled appointment time and to have medication bottles and any blood pressure readings ready for review. The patient agreed to meet with the pharmacist via telephone visit on 12/09/2024.  Doyce Razor [facility], [facility] Guide Direct Dial: [phone]  Fax: [phone]

## 2024-12-02 ENCOUNTER — Other Ambulatory Visit: Payer: Self-pay | Admitting: Internal Medicine

## 2024-12-02 DIAGNOSIS — I10 Essential (primary) hypertension: Secondary | ICD-10-CM

## 2024-12-02 DIAGNOSIS — E876 Hypokalemia: Secondary | ICD-10-CM

## 2024-12-06 ENCOUNTER — Ambulatory Visit

## 2024-12-09 ENCOUNTER — Other Ambulatory Visit

## 2025-05-26 ENCOUNTER — Encounter: Admitting: Internal Medicine

## 2025-09-15 ENCOUNTER — Encounter: Admitting: Internal Medicine

## 2025-10-10 ENCOUNTER — Ambulatory Visit
